# Patient Record
Sex: Female | Born: 1945 | Race: White | Hispanic: No | Marital: Married | Smoking: Never smoker
Health system: Southern US, Community
[De-identification: ages and names within clinical notes are randomized; demographics above are authoritative.]

## PROBLEM LIST (undated history)

## (undated) DIAGNOSIS — G43909 Migraine, unspecified, not intractable, without status migrainosus: Secondary | ICD-10-CM

## (undated) DIAGNOSIS — E7800 Pure hypercholesterolemia, unspecified: Secondary | ICD-10-CM

## (undated) DIAGNOSIS — M7730 Calcaneal spur, unspecified foot: Secondary | ICD-10-CM

## (undated) DIAGNOSIS — M8580 Other specified disorders of bone density and structure, unspecified site: Secondary | ICD-10-CM

## (undated) DIAGNOSIS — C801 Malignant (primary) neoplasm, unspecified: Secondary | ICD-10-CM

## (undated) DIAGNOSIS — D7582 Heparin induced thrombocytopenia (HIT): Secondary | ICD-10-CM

## (undated) DIAGNOSIS — D75829 Heparin-induced thrombocytopenia, unspecified: Secondary | ICD-10-CM

## (undated) DIAGNOSIS — R4702 Dysphasia: Secondary | ICD-10-CM

## (undated) DIAGNOSIS — C541 Malignant neoplasm of endometrium: Secondary | ICD-10-CM

## (undated) DIAGNOSIS — I10 Essential (primary) hypertension: Secondary | ICD-10-CM

## (undated) DIAGNOSIS — E039 Hypothyroidism, unspecified: Secondary | ICD-10-CM

## (undated) DIAGNOSIS — E079 Disorder of thyroid, unspecified: Secondary | ICD-10-CM

## (undated) DIAGNOSIS — I871 Compression of vein: Secondary | ICD-10-CM

## (undated) DIAGNOSIS — G2581 Restless legs syndrome: Secondary | ICD-10-CM

## (undated) HISTORY — PX: ANKLE SURGERY: SHX546

## (undated) HISTORY — PX: PORT-A-CATH REMOVAL: SHX5289

## (undated) HISTORY — DX: Restless legs syndrome: G25.81

## (undated) HISTORY — DX: Heparin-induced thrombocytopenia, unspecified: D75.829

## (undated) HISTORY — DX: Other specified disorders of bone density and structure, unspecified site: M85.80

## (undated) HISTORY — DX: Pure hypercholesterolemia, unspecified: E78.00

## (undated) HISTORY — PX: FINGER AMPUTATION: SHX636

## (undated) HISTORY — DX: Hypothyroidism, unspecified: E03.9

## (undated) HISTORY — PX: ABDOMINAL HYSTERECTOMY: SHX81

## (undated) HISTORY — DX: Heparin induced thrombocytopenia (HIT): D75.82

## (undated) HISTORY — PX: TONSILLECTOMY: SUR1361

## (undated) HISTORY — PX: 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: SHX551

## (undated) HISTORY — DX: Compression of vein: I87.1

## (undated) HISTORY — DX: Calcaneal spur, unspecified foot: M77.30

## (undated) HISTORY — PX: EYE SURGERY: SHX253

## (undated) HISTORY — PX: TEAR DUCT PROBING: SHX793

## (undated) HISTORY — DX: Dysphasia: R47.02

---

## 2006-11-09 ENCOUNTER — Ambulatory Visit: Payer: Self-pay | Admitting: Internal Medicine

## 2006-11-10 ENCOUNTER — Ambulatory Visit: Payer: Self-pay | Admitting: Internal Medicine

## 2006-11-10 ENCOUNTER — Ambulatory Visit (HOSPITAL_COMMUNITY): Admission: RE | Admit: 2006-11-10 | Discharge: 2006-11-10 | Payer: Self-pay | Admitting: Internal Medicine

## 2008-05-25 HISTORY — PX: 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: SHX551

## 2008-08-23 ENCOUNTER — Ambulatory Visit (HOSPITAL_COMMUNITY): Admission: RE | Admit: 2008-08-23 | Discharge: 2008-08-23 | Payer: Self-pay | Admitting: Hematology and Oncology

## 2008-09-18 ENCOUNTER — Ambulatory Visit: Payer: Self-pay | Admitting: Internal Medicine

## 2008-09-18 ENCOUNTER — Ambulatory Visit: Payer: Self-pay | Admitting: Oncology

## 2008-09-18 ENCOUNTER — Inpatient Hospital Stay (HOSPITAL_COMMUNITY): Admission: AD | Admit: 2008-09-18 | Discharge: 2008-11-02 | Payer: Self-pay | Admitting: Pulmonary Disease

## 2008-09-18 ENCOUNTER — Ambulatory Visit: Payer: Self-pay | Admitting: Pulmonary Disease

## 2008-09-19 ENCOUNTER — Encounter: Payer: Self-pay | Admitting: Internal Medicine

## 2008-09-21 ENCOUNTER — Encounter: Payer: Self-pay | Admitting: Vascular Surgery

## 2008-09-21 ENCOUNTER — Ambulatory Visit: Payer: Self-pay | Admitting: Infectious Diseases

## 2008-09-21 ENCOUNTER — Ambulatory Visit: Payer: Self-pay | Admitting: Vascular Surgery

## 2008-09-26 ENCOUNTER — Encounter (INDEPENDENT_AMBULATORY_CARE_PROVIDER_SITE_OTHER): Payer: Self-pay | Admitting: Plastic Surgery

## 2008-09-28 ENCOUNTER — Encounter (INDEPENDENT_AMBULATORY_CARE_PROVIDER_SITE_OTHER): Payer: Self-pay | Admitting: Internal Medicine

## 2008-10-05 ENCOUNTER — Ambulatory Visit: Payer: Self-pay | Admitting: Internal Medicine

## 2008-10-14 ENCOUNTER — Ambulatory Visit: Payer: Self-pay | Admitting: Vascular Surgery

## 2008-10-14 ENCOUNTER — Encounter: Payer: Self-pay | Admitting: Vascular Surgery

## 2008-10-15 ENCOUNTER — Ambulatory Visit: Payer: Self-pay | Admitting: Oncology

## 2008-10-15 ENCOUNTER — Encounter: Payer: Self-pay | Admitting: Vascular Surgery

## 2008-10-16 ENCOUNTER — Encounter (INDEPENDENT_AMBULATORY_CARE_PROVIDER_SITE_OTHER): Payer: Self-pay | Admitting: Internal Medicine

## 2008-10-17 ENCOUNTER — Encounter (INDEPENDENT_AMBULATORY_CARE_PROVIDER_SITE_OTHER): Payer: Self-pay | Admitting: Plastic Surgery

## 2008-10-23 ENCOUNTER — Other Ambulatory Visit: Payer: Self-pay | Admitting: Internal Medicine

## 2008-10-27 ENCOUNTER — Ambulatory Visit: Payer: Self-pay | Admitting: Hematology & Oncology

## 2008-10-29 ENCOUNTER — Ambulatory Visit: Payer: Self-pay | Admitting: Critical Care Medicine

## 2008-11-02 ENCOUNTER — Ambulatory Visit: Payer: Self-pay | Admitting: Physical Medicine & Rehabilitation

## 2008-11-02 ENCOUNTER — Inpatient Hospital Stay (HOSPITAL_COMMUNITY)
Admission: RE | Admit: 2008-11-02 | Discharge: 2008-11-10 | Payer: Self-pay | Admitting: Physical Medicine & Rehabilitation

## 2008-11-05 ENCOUNTER — Ambulatory Visit: Payer: Self-pay | Admitting: Physical Medicine & Rehabilitation

## 2010-06-15 ENCOUNTER — Encounter: Payer: Self-pay | Admitting: Interventional Radiology

## 2010-09-01 LAB — GLUCOSE, CAPILLARY
Glucose-Capillary: 106 mg/dL — ABNORMAL HIGH (ref 70–99)
Glucose-Capillary: 111 mg/dL — ABNORMAL HIGH (ref 70–99)
Glucose-Capillary: 112 mg/dL — ABNORMAL HIGH (ref 70–99)
Glucose-Capillary: 119 mg/dL — ABNORMAL HIGH (ref 70–99)
Glucose-Capillary: 120 mg/dL — ABNORMAL HIGH (ref 70–99)
Glucose-Capillary: 129 mg/dL — ABNORMAL HIGH (ref 70–99)
Glucose-Capillary: 131 mg/dL — ABNORMAL HIGH (ref 70–99)
Glucose-Capillary: 138 mg/dL — ABNORMAL HIGH (ref 70–99)
Glucose-Capillary: 88 mg/dL (ref 70–99)
Glucose-Capillary: 97 mg/dL (ref 70–99)
Glucose-Capillary: 98 mg/dL (ref 70–99)

## 2010-09-01 LAB — CBC
HCT: 26.9 % — ABNORMAL LOW (ref 36.0–46.0)
HCT: 28 % — ABNORMAL LOW (ref 36.0–46.0)
Hemoglobin: 8.6 g/dL — ABNORMAL LOW (ref 12.0–15.0)
Hemoglobin: 8.9 g/dL — ABNORMAL LOW (ref 12.0–15.0)
Hemoglobin: 9 g/dL — ABNORMAL LOW (ref 12.0–15.0)
Hemoglobin: 9 g/dL — ABNORMAL LOW (ref 12.0–15.0)
Hemoglobin: 9.2 g/dL — ABNORMAL LOW (ref 12.0–15.0)
Hemoglobin: 9.3 g/dL — ABNORMAL LOW (ref 12.0–15.0)
Hemoglobin: 9.4 g/dL — ABNORMAL LOW (ref 12.0–15.0)
MCHC: 33.5 g/dL (ref 30.0–36.0)
MCHC: 33.7 g/dL (ref 30.0–36.0)
MCHC: 33.9 g/dL (ref 30.0–36.0)
MCHC: 34.2 g/dL (ref 30.0–36.0)
MCHC: 34.2 g/dL (ref 30.0–36.0)
MCV: 95.8 fL (ref 78.0–100.0)
MCV: 94.2 fL (ref 78.0–100.0)
MCV: 94.7 fL (ref 78.0–100.0)
Platelets: 414 10*3/uL — ABNORMAL HIGH (ref 150–400)
Platelets: 219 10*3/uL (ref 150–400)
Platelets: 314 10*3/uL (ref 150–400)
Platelets: 344 10*3/uL (ref 150–400)
Platelets: 398 10*3/uL (ref 150–400)
Platelets: 424 10*3/uL — ABNORMAL HIGH (ref 150–400)
RBC: 3.03 MIL/uL — ABNORMAL LOW (ref 3.87–5.11)
RBC: 2.7 MIL/uL — ABNORMAL LOW (ref 3.87–5.11)
RBC: 2.78 MIL/uL — ABNORMAL LOW (ref 3.87–5.11)
RBC: 2.86 MIL/uL — ABNORMAL LOW (ref 3.87–5.11)
RBC: 2.88 MIL/uL — ABNORMAL LOW (ref 3.87–5.11)
RBC: 2.9 MIL/uL — ABNORMAL LOW (ref 3.87–5.11)
RBC: 2.9 MIL/uL — ABNORMAL LOW (ref 3.87–5.11)
RBC: 2.9 MIL/uL — ABNORMAL LOW (ref 3.87–5.11)
RDW: 21.5 % — ABNORMAL HIGH (ref 11.5–15.5)
RDW: 21.7 % — ABNORMAL HIGH (ref 11.5–15.5)
RDW: 21.7 % — ABNORMAL HIGH (ref 11.5–15.5)
RDW: 21.8 % — ABNORMAL HIGH (ref 11.5–15.5)
RDW: 22 % — ABNORMAL HIGH (ref 11.5–15.5)
RDW: 22.2 % — ABNORMAL HIGH (ref 11.5–15.5)
RDW: 22.2 % — ABNORMAL HIGH (ref 11.5–15.5)
RDW: 22.5 % — ABNORMAL HIGH (ref 11.5–15.5)
WBC: 8.4 10*3/uL (ref 4.0–10.5)
WBC: 10.7 10*3/uL — ABNORMAL HIGH (ref 4.0–10.5)
WBC: 6.2 10*3/uL (ref 4.0–10.5)
WBC: 6.7 10*3/uL (ref 4.0–10.5)
WBC: 7.2 10*3/uL (ref 4.0–10.5)

## 2010-09-01 LAB — COMPREHENSIVE METABOLIC PANEL
ALT: 38 U/L — ABNORMAL HIGH (ref 0–35)
AST: 27 U/L (ref 0–37)
Albumin: 2.5 g/dL — ABNORMAL LOW (ref 3.5–5.2)
Albumin: 2.5 g/dL — ABNORMAL LOW (ref 3.5–5.2)
BUN: 12 mg/dL (ref 6–23)
CO2: 20 mEq/L (ref 19–32)
Calcium: 8.4 mg/dL (ref 8.4–10.5)
Calcium: 8.6 mg/dL (ref 8.4–10.5)
Chloride: 108 mEq/L (ref 96–112)
Creatinine, Ser: 0.62 mg/dL (ref 0.4–1.2)
GFR calc Af Amer: >60 mL/min (ref >60)
GFR calc non Af Amer: >60 mL/min (ref >60)
Sodium: 135 mEq/L (ref 135–145)
Total Bilirubin: 0.3 mg/dL (ref 0.3–1.2)
Total Protein: 5.6 g/dL — ABNORMAL LOW (ref 6.0–8.3)

## 2010-09-01 LAB — BASIC METABOLIC PANEL
BUN: 15 mg/dL (ref 6–23)
BUN: 12 mg/dL (ref 6–23)
BUN: 21 mg/dL (ref 6–23)
BUN: 21 mg/dL (ref 6–23)
CO2: 24 mEq/L (ref 19–32)
CO2: 29 mEq/L (ref 19–32)
CO2: 33 mEq/L — ABNORMAL HIGH (ref 19–32)
Calcium: 9.2 mg/dL (ref 8.4–10.5)
Calcium: 8.6 mg/dL (ref 8.4–10.5)
Calcium: 8.7 mg/dL (ref 8.4–10.5)
Calcium: 8.7 mg/dL (ref 8.4–10.5)
Calcium: 8.7 mg/dL (ref 8.4–10.5)
Calcium: 8.9 mg/dL (ref 8.4–10.5)
Chloride: 107 mEq/L (ref 96–112)
Chloride: 112 mEq/L (ref 96–112)
Creatinine, Ser: 0.69 mg/dL (ref 0.4–1.2)
Creatinine, Ser: 0.69 mg/dL (ref 0.4–1.2)
Creatinine, Ser: 0.7 mg/dL (ref 0.4–1.2)
Creatinine, Ser: 0.74 mg/dL (ref 0.4–1.2)
GFR calc Af Amer: >60 mL/min (ref >60)
GFR calc Af Amer: >60 mL/min (ref >60)
GFR calc Af Amer: >60 mL/min (ref >60)
GFR calc Af Amer: >60 mL/min (ref >60)
GFR calc Af Amer: >60 mL/min (ref >60)
GFR calc Af Amer: >60 mL/min (ref >60)
GFR calc non Af Amer: >60 mL/min (ref >60)
GFR calc non Af Amer: >60 mL/min (ref >60)
GFR calc non Af Amer: >60 mL/min (ref >60)
GFR calc non Af Amer: >60 mL/min (ref >60)
GFR calc non Af Amer: >60 mL/min (ref >60)
GFR calc non Af Amer: >60 mL/min (ref >60)
Glucose, Bld: 100 mg/dL — ABNORMAL HIGH (ref 70–99)
Glucose, Bld: 105 mg/dL — ABNORMAL HIGH (ref 70–99)
Glucose, Bld: 129 mg/dL — ABNORMAL HIGH (ref 70–99)
Potassium: 3.4 mEq/L — ABNORMAL LOW (ref 3.5–5.1)
Sodium: 137 mEq/L (ref 135–145)
Sodium: 139 mEq/L (ref 135–145)
Sodium: 140 mEq/L (ref 135–145)
Sodium: 142 mEq/L (ref 135–145)

## 2010-09-01 LAB — PROTIME-INR
INR: 2.7 — ABNORMAL HIGH (ref 0.00–1.49)
INR: 2.9 — ABNORMAL HIGH (ref 0.00–1.49)
INR: 3.8 — ABNORMAL HIGH (ref 0.00–1.49)
INR: 1.5 (ref 0.00–1.49)
INR: 1.5 (ref 0.00–1.49)
INR: 1.9 — ABNORMAL HIGH (ref 0.00–1.49)
INR: 2.2 — ABNORMAL HIGH (ref 0.00–1.49)
INR: 3.1 — ABNORMAL HIGH (ref 0.00–1.49)
INR: 3.1 — ABNORMAL HIGH (ref 0.00–1.49)
INR: 3.2 — ABNORMAL HIGH (ref 0.00–1.49)
INR: 3.4 — ABNORMAL HIGH (ref 0.00–1.49)
INR: 3.6 — ABNORMAL HIGH (ref 0.00–1.49)
INR: 4.2 — ABNORMAL HIGH (ref 0.00–1.49)
INR: 4.5 — ABNORMAL HIGH (ref 0.00–1.49)
INR: 4.6 — ABNORMAL HIGH (ref 0.00–1.49)
Prothrombin Time: 31 seconds — ABNORMAL HIGH (ref 11.6–15.2)
Prothrombin Time: 18.9 seconds — ABNORMAL HIGH (ref 11.6–15.2)
Prothrombin Time: 19 seconds — ABNORMAL HIGH (ref 11.6–15.2)
Prothrombin Time: 26.2 seconds — ABNORMAL HIGH (ref 11.6–15.2)
Prothrombin Time: 33.9 seconds — ABNORMAL HIGH (ref 11.6–15.2)
Prothrombin Time: 34.2 seconds — ABNORMAL HIGH (ref 11.6–15.2)
Prothrombin Time: 34.9 seconds — ABNORMAL HIGH (ref 11.6–15.2)
Prothrombin Time: 37.2 seconds — ABNORMAL HIGH (ref 11.6–15.2)
Prothrombin Time: 48 seconds — ABNORMAL HIGH (ref 11.6–15.2)

## 2010-09-01 LAB — APTT
aPTT: 75 seconds — ABNORMAL HIGH (ref 24–37)
aPTT: 81 seconds — ABNORMAL HIGH (ref 24–37)
aPTT: 84 seconds — ABNORMAL HIGH (ref 24–37)
aPTT: 92 seconds — ABNORMAL HIGH (ref 24–37)
aPTT: 105 seconds — ABNORMAL HIGH (ref 24–37)
aPTT: 106 seconds — ABNORMAL HIGH (ref 24–37)
aPTT: 107 seconds — ABNORMAL HIGH (ref 24–37)
aPTT: 118 seconds — ABNORMAL HIGH (ref 24–37)
aPTT: 118 seconds — ABNORMAL HIGH (ref 24–37)
aPTT: 121 seconds — ABNORMAL HIGH (ref 24–37)
aPTT: 131 seconds — ABNORMAL HIGH (ref 24–37)
aPTT: 143 seconds — ABNORMAL HIGH (ref 24–37)
aPTT: 150 seconds — ABNORMAL HIGH (ref 24–37)
aPTT: 162 seconds — ABNORMAL HIGH (ref 24–37)
aPTT: 171 seconds — ABNORMAL HIGH (ref 24–37)
aPTT: 61 seconds — ABNORMAL HIGH (ref 24–37)
aPTT: 84 seconds — ABNORMAL HIGH (ref 24–37)
aPTT: 91 seconds — ABNORMAL HIGH (ref 24–37)

## 2010-09-01 LAB — DIFFERENTIAL
Eosinophils Relative: 5 % (ref 0–5)
Lymphs Abs: 1 10*3/uL (ref 0.7–4.0)
Monocytes Absolute: 0.4 10*3/uL (ref 0.1–1.0)
Neutro Abs: 4.4 10*3/uL (ref 1.7–7.7)

## 2010-09-01 LAB — URINALYSIS, ROUTINE W REFLEX MICROSCOPIC
Glucose, UA: NEGATIVE mg/dL
Ketones, ur: NEGATIVE mg/dL
Nitrite: NEGATIVE
pH: 5 (ref 5.0–8.0)

## 2010-09-01 LAB — URINALYSIS, MICROSCOPIC ONLY
Bilirubin Urine: NEGATIVE
Glucose, UA: NEGATIVE mg/dL
Ketones, ur: NEGATIVE mg/dL
pH: 6 (ref 5.0–8.0)

## 2010-09-01 LAB — CHROMOGENIC FACTOR X (DUKE LAB)
CHROM XA: 102 % (ref 86–146)
CHROM XA: 29.4 % — ABNORMAL LOW (ref 86–146)
CHROM XA: 31.2 % — ABNORMAL LOW (ref 86–146)

## 2010-09-01 LAB — APTT MIXING STUDY SCREEN: aPTT Baseline: 133 seconds — ABNORMAL HIGH (ref 24–37)

## 2010-09-01 LAB — URINE MICROSCOPIC-ADD ON

## 2010-09-01 LAB — URINE CULTURE

## 2010-09-01 LAB — PTT FACTOR INHIBITOR (MIXING STUDY): 1 Hr Incub APTT 4:1NP: 75 seconds

## 2010-09-02 LAB — CBC
HCT: 23.6 % — ABNORMAL LOW (ref 36.0–46.0)
HCT: 24 % — ABNORMAL LOW (ref 36.0–46.0)
HCT: 24.5 % — ABNORMAL LOW (ref 36.0–46.0)
HCT: 25.3 % — ABNORMAL LOW (ref 36.0–46.0)
HCT: 26 % — ABNORMAL LOW (ref 36.0–46.0)
HCT: 26.9 % — ABNORMAL LOW (ref 36.0–46.0)
HCT: 28.2 % — ABNORMAL LOW (ref 36.0–46.0)
HCT: 29.6 % — ABNORMAL LOW (ref 36.0–46.0)
HCT: 30.1 % — ABNORMAL LOW (ref 36.0–46.0)
HCT: 20.5 % — ABNORMAL LOW (ref 36.0–46.0)
HCT: 21.4 % — ABNORMAL LOW (ref 36.0–46.0)
HCT: 21.7 % — ABNORMAL LOW (ref 36.0–46.0)
HCT: 22 % — ABNORMAL LOW (ref 36.0–46.0)
HCT: 22.5 % — ABNORMAL LOW (ref 36.0–46.0)
HCT: 22.9 % — ABNORMAL LOW (ref 36.0–46.0)
HCT: 23.3 % — ABNORMAL LOW (ref 36.0–46.0)
HCT: 23.8 % — ABNORMAL LOW (ref 36.0–46.0)
HCT: 24.1 % — ABNORMAL LOW (ref 36.0–46.0)
HCT: 24.3 % — ABNORMAL LOW (ref 36.0–46.0)
HCT: 26.3 % — ABNORMAL LOW (ref 36.0–46.0)
HCT: 27.3 % — ABNORMAL LOW (ref 36.0–46.0)
Hemoglobin: 10.4 g/dL — ABNORMAL LOW (ref 12.0–15.0)
Hemoglobin: 10.4 g/dL — ABNORMAL LOW (ref 12.0–15.0)
Hemoglobin: 8.1 g/dL — ABNORMAL LOW (ref 12.0–15.0)
Hemoglobin: 9.2 g/dL — ABNORMAL LOW (ref 12.0–15.0)
Hemoglobin: 9.6 g/dL — ABNORMAL LOW (ref 12.0–15.0)
Hemoglobin: 6.1 g/dL — CL (ref 12.0–15.0)
Hemoglobin: 7.2 g/dL — CL (ref 12.0–15.0)
Hemoglobin: 7.2 g/dL — CL (ref 12.0–15.0)
Hemoglobin: 7.5 g/dL — CL (ref 12.0–15.0)
Hemoglobin: 7.6 g/dL — CL (ref 12.0–15.0)
Hemoglobin: 8.3 g/dL — ABNORMAL LOW (ref 12.0–15.0)
Hemoglobin: 8.3 g/dL — ABNORMAL LOW (ref 12.0–15.0)
Hemoglobin: 8.5 g/dL — ABNORMAL LOW (ref 12.0–15.0)
Hemoglobin: 9 g/dL — ABNORMAL LOW (ref 12.0–15.0)
Hemoglobin: 9.4 g/dL — ABNORMAL LOW (ref 12.0–15.0)
Hemoglobin: 9.4 g/dL — ABNORMAL LOW (ref 12.0–15.0)
Hemoglobin: 9.5 g/dL — ABNORMAL LOW (ref 12.0–15.0)
Hemoglobin: 9.6 g/dL — ABNORMAL LOW (ref 12.0–15.0)
MCHC: 33.7 g/dL (ref 30.0–36.0)
MCHC: 34 g/dL (ref 30.0–36.0)
MCHC: 34 g/dL (ref 30.0–36.0)
MCHC: 34.1 g/dL (ref 30.0–36.0)
MCHC: 34.5 g/dL (ref 30.0–36.0)
MCHC: 34.5 g/dL (ref 30.0–36.0)
MCHC: 33.7 g/dL (ref 30.0–36.0)
MCHC: 34.3 g/dL (ref 30.0–36.0)
MCHC: 34.5 g/dL (ref 30.0–36.0)
MCHC: 34.5 g/dL (ref 30.0–36.0)
MCHC: 34.7 g/dL (ref 30.0–36.0)
MCHC: 34.8 g/dL (ref 30.0–36.0)
MCHC: 34.9 g/dL (ref 30.0–36.0)
MCHC: 35.2 g/dL (ref 30.0–36.0)
MCHC: 35.4 g/dL (ref 30.0–36.0)
MCHC: 35.8 g/dL (ref 30.0–36.0)
MCV: 89.1 fL (ref 78.0–100.0)
MCV: 89.7 fL (ref 78.0–100.0)
MCV: 90.2 fL (ref 78.0–100.0)
MCV: 90.9 fL (ref 78.0–100.0)
MCV: 92.8 fL (ref 78.0–100.0)
MCV: 93.7 fL (ref 78.0–100.0)
MCV: 93.8 fL (ref 78.0–100.0)
MCV: 90.1 fL (ref 78.0–100.0)
MCV: 90.2 fL (ref 78.0–100.0)
MCV: 91 fL (ref 78.0–100.0)
MCV: 91.6 fL (ref 78.0–100.0)
MCV: 91.7 fL (ref 78.0–100.0)
MCV: 92.2 fL (ref 78.0–100.0)
MCV: 93 fL (ref 78.0–100.0)
Platelets: 118 10*3/uL — ABNORMAL LOW (ref 150–400)
Platelets: 162 10*3/uL (ref 150–400)
Platelets: 194 10*3/uL (ref 150–400)
Platelets: 205 10*3/uL (ref 150–400)
Platelets: 211 10*3/uL (ref 150–400)
Platelets: 214 10*3/uL (ref 150–400)
Platelets: 214 10*3/uL (ref 150–400)
Platelets: 235 10*3/uL (ref 150–400)
Platelets: 240 10*3/uL (ref 150–400)
Platelets: 281 10*3/uL (ref 150–400)
Platelets: 84 10*3/uL — ABNORMAL LOW (ref 150–400)
Platelets: 14 10*3/uL — CL (ref 150–400)
Platelets: 16 10*3/uL — CL (ref 150–400)
Platelets: 23 10*3/uL — CL (ref 150–400)
Platelets: 28 10*3/uL — CL (ref 150–400)
Platelets: 37 10*3/uL — CL (ref 150–400)
Platelets: 47 10*3/uL — CL (ref 150–400)
Platelets: 71 10*3/uL — ABNORMAL LOW (ref 150–400)
Platelets: 74 10*3/uL — ABNORMAL LOW (ref 150–400)
Platelets: 88 10*3/uL — ABNORMAL LOW (ref 150–400)
Platelets: 92 10*3/uL — ABNORMAL LOW (ref 150–400)
RBC: 2.55 MIL/uL — ABNORMAL LOW (ref 3.87–5.11)
RBC: 2.95 MIL/uL — ABNORMAL LOW (ref 3.87–5.11)
RBC: 3.2 MIL/uL — ABNORMAL LOW (ref 3.87–5.11)
RBC: 3.3 MIL/uL — ABNORMAL LOW (ref 3.87–5.11)
RBC: 3.41 MIL/uL — ABNORMAL LOW (ref 3.87–5.11)
RBC: 2.3 MIL/uL — ABNORMAL LOW (ref 3.87–5.11)
RBC: 2.36 MIL/uL — ABNORMAL LOW (ref 3.87–5.11)
RBC: 2.38 MIL/uL — ABNORMAL LOW (ref 3.87–5.11)
RBC: 2.38 MIL/uL — ABNORMAL LOW (ref 3.87–5.11)
RBC: 2.54 MIL/uL — ABNORMAL LOW (ref 3.87–5.11)
RBC: 2.63 MIL/uL — ABNORMAL LOW (ref 3.87–5.11)
RBC: 2.88 MIL/uL — ABNORMAL LOW (ref 3.87–5.11)
RBC: 2.88 MIL/uL — ABNORMAL LOW (ref 3.87–5.11)
RBC: 3.03 MIL/uL — ABNORMAL LOW (ref 3.87–5.11)
RBC: 3.03 MIL/uL — ABNORMAL LOW (ref 3.87–5.11)
RBC: 3.08 MIL/uL — ABNORMAL LOW (ref 3.87–5.11)
RDW: 18.2 % — ABNORMAL HIGH (ref 11.5–15.5)
RDW: 18.4 % — ABNORMAL HIGH (ref 11.5–15.5)
RDW: 18.8 % — ABNORMAL HIGH (ref 11.5–15.5)
RDW: 18.8 % — ABNORMAL HIGH (ref 11.5–15.5)
RDW: 18.9 % — ABNORMAL HIGH (ref 11.5–15.5)
RDW: 19 % — ABNORMAL HIGH (ref 11.5–15.5)
RDW: 19.3 % — ABNORMAL HIGH (ref 11.5–15.5)
RDW: 19.9 % — ABNORMAL HIGH (ref 11.5–15.5)
RDW: 20.5 % — ABNORMAL HIGH (ref 11.5–15.5)
RDW: 21 % — ABNORMAL HIGH (ref 11.5–15.5)
RDW: 21.9 % — ABNORMAL HIGH (ref 11.5–15.5)
RDW: 15.7 % — ABNORMAL HIGH (ref 11.5–15.5)
RDW: 15.7 % — ABNORMAL HIGH (ref 11.5–15.5)
RDW: 17.4 % — ABNORMAL HIGH (ref 11.5–15.5)
RDW: 17.8 % — ABNORMAL HIGH (ref 11.5–15.5)
RDW: 17.9 % — ABNORMAL HIGH (ref 11.5–15.5)
RDW: 18.4 % — ABNORMAL HIGH (ref 11.5–15.5)
RDW: 18.5 % — ABNORMAL HIGH (ref 11.5–15.5)
RDW: 18.6 % — ABNORMAL HIGH (ref 11.5–15.5)
RDW: 19 % — ABNORMAL HIGH (ref 11.5–15.5)
RDW: 19.4 % — ABNORMAL HIGH (ref 11.5–15.5)
RDW: 19.6 % — ABNORMAL HIGH (ref 11.5–15.5)
WBC: 10 10*3/uL (ref 4.0–10.5)
WBC: 10.1 10*3/uL (ref 4.0–10.5)
WBC: 10.4 10*3/uL (ref 4.0–10.5)
WBC: 13.4 10*3/uL — ABNORMAL HIGH (ref 4.0–10.5)
WBC: 16.6 10*3/uL — ABNORMAL HIGH (ref 4.0–10.5)
WBC: 8.4 10*3/uL (ref 4.0–10.5)
WBC: 8.9 10*3/uL (ref 4.0–10.5)
WBC: 9 10*3/uL (ref 4.0–10.5)
WBC: 9.3 10*3/uL (ref 4.0–10.5)
WBC: 9.3 10*3/uL (ref 4.0–10.5)
WBC: 13.6 10*3/uL — ABNORMAL HIGH (ref 4.0–10.5)
WBC: 15.2 10*3/uL — ABNORMAL HIGH (ref 4.0–10.5)
WBC: 15.3 10*3/uL — ABNORMAL HIGH (ref 4.0–10.5)
WBC: 15.9 10*3/uL — ABNORMAL HIGH (ref 4.0–10.5)
WBC: 16.2 10*3/uL — ABNORMAL HIGH (ref 4.0–10.5)
WBC: 16.8 10*3/uL — ABNORMAL HIGH (ref 4.0–10.5)
WBC: 17.3 10*3/uL — ABNORMAL HIGH (ref 4.0–10.5)
WBC: 18.3 10*3/uL — ABNORMAL HIGH (ref 4.0–10.5)
WBC: 20 10*3/uL — ABNORMAL HIGH (ref 4.0–10.5)
WBC: 22.7 10*3/uL — ABNORMAL HIGH (ref 4.0–10.5)
WBC: 23.9 10*3/uL — ABNORMAL HIGH (ref 4.0–10.5)
WBC: 6.1 10*3/uL (ref 4.0–10.5)
WBC: 7.9 10*3/uL (ref 4.0–10.5)

## 2010-09-02 LAB — GLUCOSE, CAPILLARY
Glucose-Capillary: 105 mg/dL — ABNORMAL HIGH (ref 70–99)
Glucose-Capillary: 113 mg/dL — ABNORMAL HIGH (ref 70–99)
Glucose-Capillary: 118 mg/dL — ABNORMAL HIGH (ref 70–99)
Glucose-Capillary: 128 mg/dL — ABNORMAL HIGH (ref 70–99)
Glucose-Capillary: 100 mg/dL — ABNORMAL HIGH (ref 70–99)
Glucose-Capillary: 102 mg/dL — ABNORMAL HIGH (ref 70–99)
Glucose-Capillary: 104 mg/dL — ABNORMAL HIGH (ref 70–99)
Glucose-Capillary: 105 mg/dL — ABNORMAL HIGH (ref 70–99)
Glucose-Capillary: 106 mg/dL — ABNORMAL HIGH (ref 70–99)
Glucose-Capillary: 107 mg/dL — ABNORMAL HIGH (ref 70–99)
Glucose-Capillary: 109 mg/dL — ABNORMAL HIGH (ref 70–99)
Glucose-Capillary: 112 mg/dL — ABNORMAL HIGH (ref 70–99)
Glucose-Capillary: 112 mg/dL — ABNORMAL HIGH (ref 70–99)
Glucose-Capillary: 113 mg/dL — ABNORMAL HIGH (ref 70–99)
Glucose-Capillary: 114 mg/dL — ABNORMAL HIGH (ref 70–99)
Glucose-Capillary: 115 mg/dL — ABNORMAL HIGH (ref 70–99)
Glucose-Capillary: 115 mg/dL — ABNORMAL HIGH (ref 70–99)
Glucose-Capillary: 116 mg/dL — ABNORMAL HIGH (ref 70–99)
Glucose-Capillary: 116 mg/dL — ABNORMAL HIGH (ref 70–99)
Glucose-Capillary: 117 mg/dL — ABNORMAL HIGH (ref 70–99)
Glucose-Capillary: 118 mg/dL — ABNORMAL HIGH (ref 70–99)
Glucose-Capillary: 118 mg/dL — ABNORMAL HIGH (ref 70–99)
Glucose-Capillary: 119 mg/dL — ABNORMAL HIGH (ref 70–99)
Glucose-Capillary: 119 mg/dL — ABNORMAL HIGH (ref 70–99)
Glucose-Capillary: 122 mg/dL — ABNORMAL HIGH (ref 70–99)
Glucose-Capillary: 122 mg/dL — ABNORMAL HIGH (ref 70–99)
Glucose-Capillary: 124 mg/dL — ABNORMAL HIGH (ref 70–99)
Glucose-Capillary: 124 mg/dL — ABNORMAL HIGH (ref 70–99)
Glucose-Capillary: 125 mg/dL — ABNORMAL HIGH (ref 70–99)
Glucose-Capillary: 125 mg/dL — ABNORMAL HIGH (ref 70–99)
Glucose-Capillary: 126 mg/dL — ABNORMAL HIGH (ref 70–99)
Glucose-Capillary: 126 mg/dL — ABNORMAL HIGH (ref 70–99)
Glucose-Capillary: 126 mg/dL — ABNORMAL HIGH (ref 70–99)
Glucose-Capillary: 127 mg/dL — ABNORMAL HIGH (ref 70–99)
Glucose-Capillary: 127 mg/dL — ABNORMAL HIGH (ref 70–99)
Glucose-Capillary: 129 mg/dL — ABNORMAL HIGH (ref 70–99)
Glucose-Capillary: 130 mg/dL — ABNORMAL HIGH (ref 70–99)
Glucose-Capillary: 131 mg/dL — ABNORMAL HIGH (ref 70–99)
Glucose-Capillary: 131 mg/dL — ABNORMAL HIGH (ref 70–99)
Glucose-Capillary: 132 mg/dL — ABNORMAL HIGH (ref 70–99)
Glucose-Capillary: 141 mg/dL — ABNORMAL HIGH (ref 70–99)
Glucose-Capillary: 16 mg/dL — CL (ref 70–99)
Glucose-Capillary: 56 mg/dL — ABNORMAL LOW (ref 70–99)
Glucose-Capillary: 78 mg/dL (ref 70–99)
Glucose-Capillary: 88 mg/dL (ref 70–99)
Glucose-Capillary: 91 mg/dL (ref 70–99)
Glucose-Capillary: 92 mg/dL (ref 70–99)

## 2010-09-02 LAB — COMPREHENSIVE METABOLIC PANEL
ALT: 104 U/L — ABNORMAL HIGH (ref 0–35)
ALT: 26 U/L (ref 0–35)
AST: 21 U/L (ref 0–37)
AST: 69 U/L — ABNORMAL HIGH (ref 0–37)
AST: 19 U/L (ref 0–37)
AST: 22 U/L (ref 0–37)
AST: 23 U/L (ref 0–37)
AST: 39 U/L — ABNORMAL HIGH (ref 0–37)
AST: 81 U/L — ABNORMAL HIGH (ref 0–37)
Albumin: 2.2 g/dL — ABNORMAL LOW (ref 3.5–5.2)
Albumin: 2.1 g/dL — ABNORMAL LOW (ref 3.5–5.2)
Albumin: 2.1 g/dL — ABNORMAL LOW (ref 3.5–5.2)
Albumin: 2.2 g/dL — ABNORMAL LOW (ref 3.5–5.2)
Albumin: 2.3 g/dL — ABNORMAL LOW (ref 3.5–5.2)
Albumin: 2.5 g/dL — ABNORMAL LOW (ref 3.5–5.2)
Alkaline Phosphatase: 153 U/L — ABNORMAL HIGH (ref 39–117)
Alkaline Phosphatase: 319 U/L — ABNORMAL HIGH (ref 39–117)
Alkaline Phosphatase: 125 U/L — ABNORMAL HIGH (ref 39–117)
Alkaline Phosphatase: 212 U/L — ABNORMAL HIGH (ref 39–117)
Alkaline Phosphatase: 345 U/L — ABNORMAL HIGH (ref 39–117)
Alkaline Phosphatase: 474 U/L — ABNORMAL HIGH (ref 39–117)
BUN: 31 mg/dL — ABNORMAL HIGH (ref 6–23)
BUN: 37 mg/dL — ABNORMAL HIGH (ref 6–23)
BUN: 61 mg/dL — ABNORMAL HIGH (ref 6–23)
BUN: 66 mg/dL — ABNORMAL HIGH (ref 6–23)
BUN: 67 mg/dL — ABNORMAL HIGH (ref 6–23)
CO2: 36 mEq/L — ABNORMAL HIGH (ref 19–32)
CO2: 16 mEq/L — ABNORMAL LOW (ref 19–32)
CO2: 21 mEq/L (ref 19–32)
CO2: 33 mEq/L — ABNORMAL HIGH (ref 19–32)
CO2: 37 mEq/L — ABNORMAL HIGH (ref 19–32)
Calcium: 7.4 mg/dL — ABNORMAL LOW (ref 8.4–10.5)
Calcium: 7.9 mg/dL — ABNORMAL LOW (ref 8.4–10.5)
Calcium: 8.5 mg/dL (ref 8.4–10.5)
Calcium: 8.7 mg/dL (ref 8.4–10.5)
Chloride: 101 mEq/L (ref 96–112)
Chloride: 103 mEq/L (ref 96–112)
Chloride: 112 mEq/L (ref 96–112)
Chloride: 98 mEq/L (ref 96–112)
Chloride: 99 mEq/L (ref 96–112)
Creatinine, Ser: 0.7 mg/dL (ref 0.4–1.2)
Creatinine, Ser: 0.85 mg/dL (ref 0.4–1.2)
Creatinine, Ser: 1 mg/dL (ref 0.4–1.2)
Creatinine, Ser: 1.22 mg/dL — ABNORMAL HIGH (ref 0.4–1.2)
Creatinine, Ser: 1.23 mg/dL — ABNORMAL HIGH (ref 0.4–1.2)
Creatinine, Ser: 1.27 mg/dL — ABNORMAL HIGH (ref 0.4–1.2)
Creatinine, Ser: 1.3 mg/dL — ABNORMAL HIGH (ref 0.4–1.2)
Creatinine, Ser: 1.42 mg/dL — ABNORMAL HIGH (ref 0.4–1.2)
GFR calc Af Amer: >60 mL/min (ref >60)
GFR calc Af Amer: >60 mL/min (ref >60)
GFR calc Af Amer: 45 mL/min — ABNORMAL LOW (ref >60)
GFR calc Af Amer: 49 mL/min — ABNORMAL LOW (ref >60)
GFR calc Af Amer: 50 mL/min — ABNORMAL LOW (ref >60)
GFR calc Af Amer: 54 mL/min — ABNORMAL LOW (ref >60)
GFR calc non Af Amer: >60 mL/min (ref >60)
GFR calc non Af Amer: 37 mL/min — ABNORMAL LOW (ref >60)
GFR calc non Af Amer: 42 mL/min — ABNORMAL LOW (ref >60)
GFR calc non Af Amer: 43 mL/min — ABNORMAL LOW (ref >60)
GFR calc non Af Amer: 44 mL/min — ABNORMAL LOW (ref >60)
GFR calc non Af Amer: 45 mL/min — ABNORMAL LOW (ref >60)
GFR calc non Af Amer: 56 mL/min — ABNORMAL LOW (ref >60)
Glucose, Bld: 149 mg/dL — ABNORMAL HIGH (ref 70–99)
Glucose, Bld: 150 mg/dL — ABNORMAL HIGH (ref 70–99)
Glucose, Bld: 152 mg/dL — ABNORMAL HIGH (ref 70–99)
Potassium: 3.3 mEq/L — ABNORMAL LOW (ref 3.5–5.1)
Potassium: 3.6 mEq/L (ref 3.5–5.1)
Potassium: 3.8 mEq/L (ref 3.5–5.1)
Potassium: 3.8 mEq/L (ref 3.5–5.1)
Potassium: 3.9 mEq/L (ref 3.5–5.1)
Potassium: 4.5 mEq/L (ref 3.5–5.1)
Sodium: 139 mEq/L (ref 135–145)
Sodium: 143 mEq/L (ref 135–145)
Total Bilirubin: 0.5 mg/dL (ref 0.3–1.2)
Total Bilirubin: 0.9 mg/dL (ref 0.3–1.2)
Total Bilirubin: 1.3 mg/dL — ABNORMAL HIGH (ref 0.3–1.2)
Total Bilirubin: 1.4 mg/dL — ABNORMAL HIGH (ref 0.3–1.2)
Total Bilirubin: 2.2 mg/dL — ABNORMAL HIGH (ref 0.3–1.2)
Total Bilirubin: 2.7 mg/dL — ABNORMAL HIGH (ref 0.3–1.2)
Total Protein: 5.6 g/dL — ABNORMAL LOW (ref 6.0–8.3)
Total Protein: 5 g/dL — ABNORMAL LOW (ref 6.0–8.3)
Total Protein: 5.1 g/dL — ABNORMAL LOW (ref 6.0–8.3)

## 2010-09-02 LAB — POCT I-STAT 3, ART BLOOD GAS (G3+)
Acid-Base Excess: 4 mmol/L — ABNORMAL HIGH (ref 0.0–2.0)
Acid-Base Excess: 6 mmol/L — ABNORMAL HIGH (ref 0.0–2.0)
Acid-Base Excess: 13 mmol/L — ABNORMAL HIGH (ref 0.0–2.0)
Acid-base deficit: 1 mmol/L (ref 0.0–2.0)
Acid-base deficit: 16 mmol/L — ABNORMAL HIGH (ref 0.0–2.0)
Acid-base deficit: 3 mmol/L — ABNORMAL HIGH (ref 0.0–2.0)
Acid-base deficit: 7 mmol/L — ABNORMAL HIGH (ref 0.0–2.0)
Acid-base deficit: 8 mmol/L — ABNORMAL HIGH (ref 0.0–2.0)
Acid-base deficit: 9 mmol/L — ABNORMAL HIGH (ref 0.0–2.0)
Acid-base deficit: 9 mmol/L — ABNORMAL HIGH (ref 0.0–2.0)
Bicarbonate: 25.2 mEq/L — ABNORMAL HIGH (ref 20.0–24.0)
Bicarbonate: 13.2 mEq/L — ABNORMAL LOW (ref 20.0–24.0)
Bicarbonate: 15 mEq/L — ABNORMAL LOW (ref 20.0–24.0)
Bicarbonate: 15.1 mEq/L — ABNORMAL LOW (ref 20.0–24.0)
Bicarbonate: 15.7 mEq/L — ABNORMAL LOW (ref 20.0–24.0)
Bicarbonate: 16 mEq/L — ABNORMAL LOW (ref 20.0–24.0)
Bicarbonate: 19.7 mEq/L — ABNORMAL LOW (ref 20.0–24.0)
Bicarbonate: 29 mEq/L — ABNORMAL HIGH (ref 20.0–24.0)
Bicarbonate: 29.1 mEq/L — ABNORMAL HIGH (ref 20.0–24.0)
Bicarbonate: 34.5 mEq/L — ABNORMAL HIGH (ref 20.0–24.0)
Bicarbonate: 34.7 mEq/L — ABNORMAL HIGH (ref 20.0–24.0)
Bicarbonate: 36.7 mEq/L — ABNORMAL HIGH (ref 20.0–24.0)
Bicarbonate: 9.4 mEq/L — ABNORMAL LOW (ref 20.0–24.0)
O2 Saturation: 84 %
O2 Saturation: 93 %
O2 Saturation: 96 %
O2 Saturation: 97 %
O2 Saturation: 98 %
O2 Saturation: 98 %
O2 Saturation: 99 %
O2 Saturation: 99 %
Patient temperature: 98.5
Patient temperature: 98.5
Patient temperature: 98.6
Patient temperature: 99.8
Patient temperature: 99.9
TCO2: 26 mmol/L (ref 0–100)
TCO2: 28 mmol/L (ref 0–100)
TCO2: 29 mmol/L (ref 0–100)
TCO2: 16 mmol/L (ref 0–100)
TCO2: 17 mmol/L (ref 0–100)
TCO2: 18 mmol/L (ref 0–100)
TCO2: 36 mmol/L (ref 0–100)
TCO2: 38 mmol/L (ref 0–100)
pCO2 arterial: 29.9 mmHg — ABNORMAL LOW (ref 35.0–45.0)
pCO2 arterial: 30.7 mmHg — ABNORMAL LOW (ref 35.0–45.0)
pCO2 arterial: 40.6 mmHg (ref 35.0–45.0)
pCO2 arterial: 18.8 mmHg — CL (ref 35.0–45.0)
pCO2 arterial: 24.6 mmHg — ABNORMAL LOW (ref 35.0–45.0)
pCO2 arterial: 24.9 mmHg — ABNORMAL LOW (ref 35.0–45.0)
pCO2 arterial: 25.7 mmHg — ABNORMAL LOW (ref 35.0–45.0)
pCO2 arterial: 25.7 mmHg — ABNORMAL LOW (ref 35.0–45.0)
pCO2 arterial: 26.8 mmHg — ABNORMAL LOW (ref 35.0–45.0)
pCO2 arterial: 34 mmHg — ABNORMAL LOW (ref 35.0–45.0)
pCO2 arterial: 37.7 mmHg (ref 35.0–45.0)
pCO2 arterial: 40.1 mmHg (ref 35.0–45.0)
pH, Arterial: 7.44 — ABNORMAL HIGH (ref 7.350–7.400)
pH, Arterial: 7.534 — ABNORMAL HIGH (ref 7.350–7.400)
pH, Arterial: 7.306 — ABNORMAL LOW (ref 7.350–7.400)
pH, Arterial: 7.324 — ABNORMAL LOW (ref 7.350–7.400)
pH, Arterial: 7.454 — ABNORMAL HIGH (ref 7.350–7.400)
pH, Arterial: 7.492 — ABNORMAL HIGH (ref 7.350–7.400)
pH, Arterial: 7.517 — ABNORMAL HIGH (ref 7.350–7.400)
pH, Arterial: 7.533 — ABNORMAL HIGH (ref 7.350–7.400)
pH, Arterial: 7.667 (ref 7.350–7.400)
pO2, Arterial: 104 mmHg — ABNORMAL HIGH (ref 80.0–100.0)
pO2, Arterial: 109 mmHg — ABNORMAL HIGH (ref 80.0–100.0)
pO2, Arterial: 120 mmHg — ABNORMAL HIGH (ref 80.0–100.0)
pO2, Arterial: 121 mmHg — ABNORMAL HIGH (ref 80.0–100.0)
pO2, Arterial: 171 mmHg — ABNORMAL HIGH (ref 80.0–100.0)
pO2, Arterial: 369 mmHg — ABNORMAL HIGH (ref 80.0–100.0)
pO2, Arterial: 70 mmHg — ABNORMAL LOW (ref 80.0–100.0)
pO2, Arterial: 88 mmHg (ref 80.0–100.0)
pO2, Arterial: 88 mmHg (ref 80.0–100.0)
pO2, Arterial: 95 mmHg (ref 80.0–100.0)
pO2, Arterial: 99 mmHg (ref 80.0–100.0)

## 2010-09-02 LAB — APTT
aPTT: 107 seconds — ABNORMAL HIGH (ref 24–37)
aPTT: 110 seconds — ABNORMAL HIGH (ref 24–37)
aPTT: 110 seconds — ABNORMAL HIGH (ref 24–37)
aPTT: 111 seconds — ABNORMAL HIGH (ref 24–37)
aPTT: 118 seconds — ABNORMAL HIGH (ref 24–37)
aPTT: 42 seconds — ABNORMAL HIGH (ref 24–37)
aPTT: 58 seconds — ABNORMAL HIGH (ref 24–37)
aPTT: 60 seconds — ABNORMAL HIGH (ref 24–37)
aPTT: 73 seconds — ABNORMAL HIGH (ref 24–37)
aPTT: 74 seconds — ABNORMAL HIGH (ref 24–37)
aPTT: 74 seconds — ABNORMAL HIGH (ref 24–37)
aPTT: 76 seconds — ABNORMAL HIGH (ref 24–37)
aPTT: 81 seconds — ABNORMAL HIGH (ref 24–37)
aPTT: 84 seconds — ABNORMAL HIGH (ref 24–37)
aPTT: 86 seconds — ABNORMAL HIGH (ref 24–37)
aPTT: 87 seconds — ABNORMAL HIGH (ref 24–37)
aPTT: 92 seconds — ABNORMAL HIGH (ref 24–37)
aPTT: 94 seconds — ABNORMAL HIGH (ref 24–37)
aPTT: 95 seconds — ABNORMAL HIGH (ref 24–37)
aPTT: 96 seconds — ABNORMAL HIGH (ref 24–37)
aPTT: 107 seconds — ABNORMAL HIGH (ref 24–37)
aPTT: 57 seconds — ABNORMAL HIGH (ref 24–37)
aPTT: 67 seconds — ABNORMAL HIGH (ref 24–37)
aPTT: 68 seconds — ABNORMAL HIGH (ref 24–37)
aPTT: 70 seconds — ABNORMAL HIGH (ref 24–37)
aPTT: 74 seconds — ABNORMAL HIGH (ref 24–37)
aPTT: 80 seconds — ABNORMAL HIGH (ref 24–37)
aPTT: 80 seconds — ABNORMAL HIGH (ref 24–37)
aPTT: 81 seconds — ABNORMAL HIGH (ref 24–37)
aPTT: 89 seconds — ABNORMAL HIGH (ref 24–37)

## 2010-09-02 LAB — BASIC METABOLIC PANEL
BUN: 18 mg/dL (ref 6–23)
BUN: 19 mg/dL (ref 6–23)
BUN: 20 mg/dL (ref 6–23)
BUN: 21 mg/dL (ref 6–23)
BUN: 22 mg/dL (ref 6–23)
BUN: 24 mg/dL — ABNORMAL HIGH (ref 6–23)
BUN: 24 mg/dL — ABNORMAL HIGH (ref 6–23)
BUN: 24 mg/dL — ABNORMAL HIGH (ref 6–23)
BUN: 25 mg/dL — ABNORMAL HIGH (ref 6–23)
BUN: 27 mg/dL — ABNORMAL HIGH (ref 6–23)
BUN: 46 mg/dL — ABNORMAL HIGH (ref 6–23)
CO2: 27 mEq/L (ref 19–32)
CO2: 28 mEq/L (ref 19–32)
CO2: 30 mEq/L (ref 19–32)
CO2: 31 mEq/L (ref 19–32)
CO2: 18 mEq/L — ABNORMAL LOW (ref 19–32)
CO2: 18 mEq/L — ABNORMAL LOW (ref 19–32)
CO2: 19 mEq/L (ref 19–32)
CO2: 31 mEq/L (ref 19–32)
Calcium: 7 mg/dL — ABNORMAL LOW (ref 8.4–10.5)
Calcium: 8 mg/dL — ABNORMAL LOW (ref 8.4–10.5)
Calcium: 8.1 mg/dL — ABNORMAL LOW (ref 8.4–10.5)
Calcium: 8.3 mg/dL — ABNORMAL LOW (ref 8.4–10.5)
Calcium: 8.3 mg/dL — ABNORMAL LOW (ref 8.4–10.5)
Calcium: 8.3 mg/dL — ABNORMAL LOW (ref 8.4–10.5)
Calcium: 8.6 mg/dL (ref 8.4–10.5)
Calcium: 8.7 mg/dL (ref 8.4–10.5)
Calcium: 7.3 mg/dL — ABNORMAL LOW (ref 8.4–10.5)
Calcium: 7.5 mg/dL — ABNORMAL LOW (ref 8.4–10.5)
Calcium: 7.6 mg/dL — ABNORMAL LOW (ref 8.4–10.5)
Calcium: 7.8 mg/dL — ABNORMAL LOW (ref 8.4–10.5)
Chloride: 104 mEq/L (ref 96–112)
Chloride: 104 mEq/L (ref 96–112)
Chloride: 106 mEq/L (ref 96–112)
Chloride: 107 mEq/L (ref 96–112)
Chloride: 115 mEq/L — ABNORMAL HIGH (ref 96–112)
Chloride: 98 mEq/L (ref 96–112)
Creatinine, Ser: 0.64 mg/dL (ref 0.4–1.2)
Creatinine, Ser: 0.68 mg/dL (ref 0.4–1.2)
Creatinine, Ser: 0.68 mg/dL (ref 0.4–1.2)
Creatinine, Ser: 0.94 mg/dL (ref 0.4–1.2)
Creatinine, Ser: 0.96 mg/dL (ref 0.4–1.2)
Creatinine, Ser: 1.04 mg/dL (ref 0.4–1.2)
Creatinine, Ser: 0.99 mg/dL (ref 0.4–1.2)
Creatinine, Ser: 0.99 mg/dL (ref 0.4–1.2)
Creatinine, Ser: 1 mg/dL (ref 0.4–1.2)
Creatinine, Ser: 1.24 mg/dL — ABNORMAL HIGH (ref 0.4–1.2)
GFR calc Af Amer: >60 mL/min (ref >60)
GFR calc Af Amer: >60 mL/min (ref >60)
GFR calc Af Amer: >60 mL/min (ref >60)
GFR calc Af Amer: >60 mL/min (ref >60)
GFR calc Af Amer: 53 mL/min — ABNORMAL LOW (ref >60)
GFR calc Af Amer: 54 mL/min — ABNORMAL LOW (ref >60)
GFR calc Af Amer: >60 mL/min (ref >60)
GFR calc Af Amer: >60 mL/min (ref >60)
GFR calc non Af Amer: 54 mL/min — ABNORMAL LOW (ref >60)
GFR calc non Af Amer: 59 mL/min — ABNORMAL LOW (ref >60)
GFR calc non Af Amer: >60 mL/min (ref >60)
GFR calc non Af Amer: >60 mL/min (ref >60)
GFR calc non Af Amer: >60 mL/min (ref >60)
GFR calc non Af Amer: >60 mL/min (ref >60)
GFR calc non Af Amer: >60 mL/min (ref >60)
GFR calc non Af Amer: >60 mL/min (ref >60)
GFR calc non Af Amer: >60 mL/min (ref >60)
GFR calc non Af Amer: >60 mL/min (ref >60)
GFR calc non Af Amer: 45 mL/min — ABNORMAL LOW (ref >60)
GFR calc non Af Amer: 48 mL/min — ABNORMAL LOW (ref >60)
GFR calc non Af Amer: 54 mL/min — ABNORMAL LOW (ref >60)
GFR calc non Af Amer: 55 mL/min — ABNORMAL LOW (ref >60)
GFR calc non Af Amer: 56 mL/min — ABNORMAL LOW (ref >60)
Glucose, Bld: 113 mg/dL — ABNORMAL HIGH (ref 70–99)
Glucose, Bld: 114 mg/dL — ABNORMAL HIGH (ref 70–99)
Glucose, Bld: 120 mg/dL — ABNORMAL HIGH (ref 70–99)
Glucose, Bld: 128 mg/dL — ABNORMAL HIGH (ref 70–99)
Glucose, Bld: 132 mg/dL — ABNORMAL HIGH (ref 70–99)
Glucose, Bld: 165 mg/dL — ABNORMAL HIGH (ref 70–99)
Glucose, Bld: 191 mg/dL — ABNORMAL HIGH (ref 70–99)
Glucose, Bld: 97 mg/dL (ref 70–99)
Glucose, Bld: 119 mg/dL — ABNORMAL HIGH (ref 70–99)
Glucose, Bld: 127 mg/dL — ABNORMAL HIGH (ref 70–99)
Glucose, Bld: 139 mg/dL — ABNORMAL HIGH (ref 70–99)
Glucose, Bld: 142 mg/dL — ABNORMAL HIGH (ref 70–99)
Glucose, Bld: 143 mg/dL — ABNORMAL HIGH (ref 70–99)
Potassium: 3.2 mEq/L — ABNORMAL LOW (ref 3.5–5.1)
Potassium: 3.4 mEq/L — ABNORMAL LOW (ref 3.5–5.1)
Potassium: 3.4 mEq/L — ABNORMAL LOW (ref 3.5–5.1)
Potassium: 3.5 mEq/L (ref 3.5–5.1)
Potassium: 3.8 mEq/L (ref 3.5–5.1)
Potassium: 3.5 mEq/L (ref 3.5–5.1)
Potassium: 3.5 mEq/L (ref 3.5–5.1)
Potassium: 3.7 mEq/L (ref 3.5–5.1)
Potassium: 4 mEq/L (ref 3.5–5.1)
Sodium: 139 mEq/L (ref 135–145)
Sodium: 140 mEq/L (ref 135–145)
Sodium: 143 mEq/L (ref 135–145)
Sodium: 143 mEq/L (ref 135–145)
Sodium: 135 mEq/L (ref 135–145)
Sodium: 137 mEq/L (ref 135–145)
Sodium: 138 mEq/L (ref 135–145)
Sodium: 140 mEq/L (ref 135–145)
Sodium: 143 mEq/L (ref 135–145)

## 2010-09-02 LAB — URINE CULTURE
Colony Count: 10000
Colony Count: NO GROWTH

## 2010-09-02 LAB — CROSSMATCH: ABO/RH(D): A POS

## 2010-09-02 LAB — PROTIME-INR
INR: 1.3 (ref 0.00–1.49)
INR: 1.3 (ref 0.00–1.49)
INR: 1.6 — ABNORMAL HIGH (ref 0.00–1.49)
INR: 1.7 — ABNORMAL HIGH (ref 0.00–1.49)
INR: 2.2 — ABNORMAL HIGH (ref 0.00–1.49)
INR: 1.4 (ref 0.00–1.49)
INR: 1.4 (ref 0.00–1.49)
INR: 1.4 (ref 0.00–1.49)
Prothrombin Time: 16.2 seconds — ABNORMAL HIGH (ref 11.6–15.2)
Prothrombin Time: 16.4 seconds — ABNORMAL HIGH (ref 11.6–15.2)
Prothrombin Time: 16.5 seconds — ABNORMAL HIGH (ref 11.6–15.2)
Prothrombin Time: 16.5 seconds — ABNORMAL HIGH (ref 11.6–15.2)
Prothrombin Time: 19.1 seconds — ABNORMAL HIGH (ref 11.6–15.2)
Prothrombin Time: 19.8 seconds — ABNORMAL HIGH (ref 11.6–15.2)
Prothrombin Time: 21 seconds — ABNORMAL HIGH (ref 11.6–15.2)
Prothrombin Time: 28.1 seconds — ABNORMAL HIGH (ref 11.6–15.2)
Prothrombin Time: 15.8 seconds — ABNORMAL HIGH (ref 11.6–15.2)
Prothrombin Time: 16.1 seconds — ABNORMAL HIGH (ref 11.6–15.2)
Prothrombin Time: 17.5 seconds — ABNORMAL HIGH (ref 11.6–15.2)
Prothrombin Time: 18 seconds — ABNORMAL HIGH (ref 11.6–15.2)

## 2010-09-02 LAB — URINALYSIS, ROUTINE W REFLEX MICROSCOPIC
Bilirubin Urine: NEGATIVE
Glucose, UA: NEGATIVE mg/dL
Hgb urine dipstick: NEGATIVE
Ketones, ur: NEGATIVE mg/dL
Ketones, ur: 15 mg/dL — AB
Nitrite: NEGATIVE
Nitrite: POSITIVE — AB
Specific Gravity, Urine: 1.018 (ref 1.005–1.030)
Specific Gravity, Urine: 1.027 (ref 1.005–1.030)
Urobilinogen, UA: 0.2 mg/dL (ref 0.0–1.0)
pH: 5 (ref 5.0–8.0)
pH: 5.5 (ref 5.0–8.0)

## 2010-09-02 LAB — MAGNESIUM
Magnesium: 2.1 mg/dL (ref 1.5–2.5)
Magnesium: 2.2 mg/dL (ref 1.5–2.5)
Magnesium: 2.3 mg/dL (ref 1.5–2.5)
Magnesium: 2.6 mg/dL — ABNORMAL HIGH (ref 1.5–2.5)
Magnesium: 2 mg/dL (ref 1.5–2.5)
Magnesium: 2.2 mg/dL (ref 1.5–2.5)
Magnesium: 2.3 mg/dL (ref 1.5–2.5)
Magnesium: 2.3 mg/dL (ref 1.5–2.5)

## 2010-09-02 LAB — DIFFERENTIAL
Basophils Absolute: 0 10*3/uL (ref 0.0–0.1)
Basophils Relative: 0 % (ref 0–1)
Basophils Relative: 0 % (ref 0–1)
Eosinophils Absolute: 0 10*3/uL (ref 0.0–0.7)
Eosinophils Absolute: 0 10*3/uL (ref 0.0–0.7)
Eosinophils Relative: 0 % (ref 0–5)
Eosinophils Relative: 0 % (ref 0–5)
Lymphocytes Relative: 7 % — ABNORMAL LOW (ref 12–46)
Lymphs Abs: 1.1 10*3/uL (ref 0.7–4.0)
Lymphs Abs: 1.3 10*3/uL (ref 0.7–4.0)
Monocytes Absolute: 0.6 10*3/uL (ref 0.1–1.0)
Monocytes Absolute: 0.7 10*3/uL (ref 0.1–1.0)
Monocytes Absolute: 1 10*3/uL (ref 0.1–1.0)
Monocytes Relative: 4 % (ref 3–12)
Monocytes Relative: 4 % (ref 3–12)
Neutro Abs: 16.7 10*3/uL — ABNORMAL HIGH (ref 1.7–7.7)
Neutrophils Relative %: 88 % — ABNORMAL HIGH (ref 43–77)
Neutrophils Relative %: 89 % — ABNORMAL HIGH (ref 43–77)

## 2010-09-02 LAB — CARDIAC PANEL(CRET KIN+CKTOT+MB+TROPI)
CK, MB: 3.8 ng/mL (ref 0.3–4.0)
Relative Index: INVALID (ref 0.0–2.5)
Total CK: 47 U/L (ref 7–177)

## 2010-09-02 LAB — PHOSPHORUS
Phosphorus: 3.6 mg/dL (ref 2.3–4.6)
Phosphorus: 4 mg/dL (ref 2.3–4.6)
Phosphorus: 4.2 mg/dL (ref 2.3–4.6)
Phosphorus: 3.2 mg/dL (ref 2.3–4.6)
Phosphorus: 4 mg/dL (ref 2.3–4.6)
Phosphorus: 4 mg/dL (ref 2.3–4.6)
Phosphorus: 4.1 mg/dL (ref 2.3–4.6)
Phosphorus: 4.9 mg/dL — ABNORMAL HIGH (ref 2.3–4.6)
Phosphorus: 5.2 mg/dL — ABNORMAL HIGH (ref 2.3–4.6)

## 2010-09-02 LAB — CULTURE, RESPIRATORY W GRAM STAIN

## 2010-09-02 LAB — URINE MICROSCOPIC-ADD ON

## 2010-09-02 LAB — PREPARE PLATELETS

## 2010-09-02 LAB — HEPATIC FUNCTION PANEL
ALT: 42 U/L — ABNORMAL HIGH (ref 0–35)
AST: 27 U/L (ref 0–37)
Albumin: 2 g/dL — ABNORMAL LOW (ref 3.5–5.2)
Alkaline Phosphatase: 235 U/L — ABNORMAL HIGH (ref 39–117)
Bilirubin, Direct: 0.2 mg/dL (ref 0.0–0.3)
Bilirubin, Direct: 0.2 mg/dL (ref 0.0–0.3)
Total Bilirubin: 0.6 mg/dL (ref 0.3–1.2)
Total Bilirubin: 0.7 mg/dL (ref 0.3–1.2)

## 2010-09-02 LAB — CULTURE, BLOOD (ROUTINE X 2)
Culture: NO GROWTH
Culture: NO GROWTH

## 2010-09-02 LAB — TYPE AND SCREEN: ABO/RH(D): A POS

## 2010-09-02 LAB — MYOGLOBIN, SERUM: Myoglobin: 689 ng/mL — ABNORMAL HIGH (ref <111)

## 2010-09-02 LAB — POCT I-STAT 7, (LYTES, BLD GAS, ICA,H+H)
Acid-base deficit: 8 mmol/L — ABNORMAL HIGH (ref 0.0–2.0)
Calcium, Ion: 1.12 mmol/L (ref 1.12–1.32)
HCT: 21 % — ABNORMAL LOW (ref 36.0–46.0)
Potassium: 4.2 mEq/L (ref 3.5–5.1)
pO2, Arterial: 330 mmHg — ABNORMAL HIGH (ref 80.0–100.0)

## 2010-09-02 LAB — CHROMOGENIC FACTOR X (DUKE LAB)
CHROM XA: 83.3 % — ABNORMAL LOW (ref 86–146)
CHROM XA: 83.3 % — ABNORMAL LOW (ref 86–146)

## 2010-09-02 LAB — CK: Total CK: 199 U/L — ABNORMAL HIGH (ref 7–177)

## 2010-09-02 LAB — RETICULOCYTES
RBC.: 2 MIL/uL — ABNORMAL LOW (ref 3.87–5.11)
Retic Count, Absolute: 132 10*3/uL (ref 19.0–186.0)
Retic Ct Pct: 6.6 % — ABNORMAL HIGH (ref 0.4–3.1)

## 2010-09-02 LAB — OSMOLALITY, URINE: Osmolality, Ur: 463 mOsm/kg (ref 390–1090)

## 2010-09-02 LAB — HEPARIN ANTIBODY SCREEN: Heparin Antibody Screen: POSITIVE

## 2010-09-02 LAB — MYOGLOBIN, URINE: Myoglobin, Ur: <27 mcg/L (ref <28)

## 2010-09-03 LAB — CBC
HCT: 16.1 % — ABNORMAL LOW (ref 36.0–46.0)
HCT: 25.3 % — ABNORMAL LOW (ref 36.0–46.0)
HCT: 25.7 % — ABNORMAL LOW (ref 36.0–46.0)
HCT: 26.3 % — ABNORMAL LOW (ref 36.0–46.0)
Hemoglobin: 11 g/dL — ABNORMAL LOW (ref 12.0–15.0)
Hemoglobin: 8.2 g/dL — ABNORMAL LOW (ref 12.0–15.0)
Hemoglobin: 8.9 g/dL — ABNORMAL LOW (ref 12.0–15.0)
Hemoglobin: 9 g/dL — ABNORMAL LOW (ref 12.0–15.0)
MCHC: 35 g/dL (ref 30.0–36.0)
MCV: 97.6 fL (ref 78.0–100.0)
MCV: 97.8 fL (ref 78.0–100.0)
Platelets: 20 10*3/uL — CL (ref 150–400)
Platelets: 46 10*3/uL — CL (ref 150–400)
RBC: 1.64 MIL/uL — ABNORMAL LOW (ref 3.87–5.11)
RBC: 2.38 MIL/uL — ABNORMAL LOW (ref 3.87–5.11)
RBC: 3.1 MIL/uL — ABNORMAL LOW (ref 3.87–5.11)
RDW: 12.5 % (ref 11.5–15.5)
RDW: 12.9 % (ref 11.5–15.5)
RDW: 15.2 % (ref 11.5–15.5)
RDW: 15.3 % (ref 11.5–15.5)
WBC: 20.4 10*3/uL — ABNORMAL HIGH (ref 4.0–10.5)
WBC: 22.5 10*3/uL — ABNORMAL HIGH (ref 4.0–10.5)
WBC: 23.2 10*3/uL — ABNORMAL HIGH (ref 4.0–10.5)
WBC: 28.7 10*3/uL — ABNORMAL HIGH (ref 4.0–10.5)

## 2010-09-03 LAB — RETICULOCYTES
RBC.: 2.3 MIL/uL — ABNORMAL LOW (ref 3.87–5.11)
Retic Count, Absolute: 73.6 10*3/uL (ref 19.0–186.0)
Retic Ct Pct: 3.2 % — ABNORMAL HIGH (ref 0.4–3.1)

## 2010-09-03 LAB — OSMOLALITY: Osmolality: 274 mOsm/kg — ABNORMAL LOW (ref 275–300)

## 2010-09-03 LAB — COMPREHENSIVE METABOLIC PANEL
ALT: 37 U/L — ABNORMAL HIGH (ref 0–35)
ALT: 46 U/L — ABNORMAL HIGH (ref 0–35)
ALT: 53 U/L — ABNORMAL HIGH (ref 0–35)
AST: 19 U/L (ref 0–37)
AST: 32 U/L (ref 0–37)
Alkaline Phosphatase: 214 U/L — ABNORMAL HIGH (ref 39–117)
Alkaline Phosphatase: 228 U/L — ABNORMAL HIGH (ref 39–117)
BUN: 17 mg/dL (ref 6–23)
CO2: 17 mEq/L — ABNORMAL LOW (ref 19–32)
CO2: 18 mEq/L — ABNORMAL LOW (ref 19–32)
CO2: 20 mEq/L (ref 19–32)
Chloride: 101 mEq/L (ref 96–112)
Chloride: 102 mEq/L (ref 96–112)
Chloride: 107 mEq/L (ref 96–112)
Creatinine, Ser: 0.68 mg/dL (ref 0.4–1.2)
Creatinine, Ser: 0.83 mg/dL (ref 0.4–1.2)
GFR calc Af Amer: >60 mL/min (ref >60)
GFR calc Af Amer: >60 mL/min (ref >60)
GFR calc non Af Amer: >60 mL/min (ref >60)
GFR calc non Af Amer: >60 mL/min (ref >60)
Glucose, Bld: 99 mg/dL (ref 70–99)
Potassium: 3.1 mEq/L — ABNORMAL LOW (ref 3.5–5.1)
Sodium: 129 mEq/L — ABNORMAL LOW (ref 135–145)
Total Bilirubin: 0.7 mg/dL (ref 0.3–1.2)
Total Bilirubin: 1 mg/dL (ref 0.3–1.2)
Total Protein: 6 g/dL (ref 6.0–8.3)

## 2010-09-03 LAB — GLUCOSE, CAPILLARY
Glucose-Capillary: 104 mg/dL — ABNORMAL HIGH (ref 70–99)
Glucose-Capillary: 123 mg/dL — ABNORMAL HIGH (ref 70–99)
Glucose-Capillary: 128 mg/dL — ABNORMAL HIGH (ref 70–99)
Glucose-Capillary: 13 mg/dL — CL (ref 70–99)
Glucose-Capillary: 162 mg/dL — ABNORMAL HIGH (ref 70–99)
Glucose-Capillary: 211 mg/dL — ABNORMAL HIGH (ref 70–99)
Glucose-Capillary: 252 mg/dL — ABNORMAL HIGH (ref 70–99)
Glucose-Capillary: 52 mg/dL — ABNORMAL LOW (ref 70–99)
Glucose-Capillary: 61 mg/dL — ABNORMAL LOW (ref 70–99)
Glucose-Capillary: 63 mg/dL — ABNORMAL LOW (ref 70–99)
Glucose-Capillary: 64 mg/dL — ABNORMAL LOW (ref 70–99)
Glucose-Capillary: 69 mg/dL — ABNORMAL LOW (ref 70–99)
Glucose-Capillary: 71 mg/dL (ref 70–99)
Glucose-Capillary: 109 mg/dL — ABNORMAL HIGH (ref 70–99)

## 2010-09-03 LAB — CULTURE, BLOOD (ROUTINE X 2)

## 2010-09-03 LAB — URINALYSIS, ROUTINE W REFLEX MICROSCOPIC
Hgb urine dipstick: NEGATIVE
Nitrite: NEGATIVE
Protein, ur: 30 mg/dL — AB
Urobilinogen, UA: 1 mg/dL (ref 0.0–1.0)

## 2010-09-03 LAB — POCT I-STAT 3, ART BLOOD GAS (G3+)
Acid-base deficit: 8 mmol/L — ABNORMAL HIGH (ref 0.0–2.0)
Acid-base deficit: 9 mmol/L — ABNORMAL HIGH (ref 0.0–2.0)
Bicarbonate: 14.3 mEq/L — ABNORMAL LOW (ref 20.0–24.0)
O2 Saturation: 100 %
O2 Saturation: 94 %
TCO2: 15 mmol/L (ref 0–100)
pCO2 arterial: 20.6 mmHg — ABNORMAL LOW (ref 35.0–45.0)
pO2, Arterial: 227 mmHg — ABNORMAL HIGH (ref 80.0–100.0)
pO2, Arterial: 64 mmHg — ABNORMAL LOW (ref 80.0–100.0)

## 2010-09-03 LAB — PROTIME-INR
INR: 1.2 (ref 0.00–1.49)
INR: 1.4 (ref 0.00–1.49)
INR: 1.4 (ref 0.00–1.49)
Prothrombin Time: 15.2 seconds (ref 11.6–15.2)
Prothrombin Time: 18.1 seconds — ABNORMAL HIGH (ref 11.6–15.2)

## 2010-09-03 LAB — TYPE AND SCREEN
ABO/RH(D): A POS
Antibody Screen: NEGATIVE

## 2010-09-03 LAB — APTT
aPTT: 34 seconds (ref 24–37)
aPTT: 47 seconds — ABNORMAL HIGH (ref 24–37)

## 2010-09-03 LAB — PREPARE PLATELETS

## 2010-09-03 LAB — BASIC METABOLIC PANEL
BUN: 29 mg/dL — ABNORMAL HIGH (ref 6–23)
CO2: 20 mEq/L (ref 19–32)
GFR calc non Af Amer: 53 mL/min — ABNORMAL LOW (ref >60)
Glucose, Bld: 144 mg/dL — ABNORMAL HIGH (ref 70–99)
Glucose, Bld: 170 mg/dL — ABNORMAL HIGH (ref 70–99)
Potassium: 3.6 mEq/L (ref 3.5–5.1)
Potassium: 3.9 mEq/L (ref 3.5–5.1)
Sodium: 135 mEq/L (ref 135–145)
Sodium: 138 mEq/L (ref 135–145)

## 2010-09-03 LAB — FIBRINOGEN
Fibrinogen: 279 mg/dL (ref 204–475)
Fibrinogen: 320 mg/dL (ref 204–475)
Fibrinogen: 334 mg/dL (ref 204–475)

## 2010-09-03 LAB — CREATININE, SERUM
Creatinine, Ser: 0.95 mg/dL (ref 0.4–1.2)
GFR calc Af Amer: >60 mL/min (ref >60)
GFR calc non Af Amer: 60 mL/min — ABNORMAL LOW (ref >60)

## 2010-09-03 LAB — PREPARE RBC (CROSSMATCH)

## 2010-09-03 LAB — URINE CULTURE: Colony Count: 60000

## 2010-09-03 LAB — HEPARIN LEVEL (UNFRACTIONATED)
Heparin Unfractionated: 0.23 IU/mL — ABNORMAL LOW (ref 0.30–0.70)
Heparin Unfractionated: <0.1 IU/mL — ABNORMAL LOW (ref 0.30–0.70)

## 2010-09-03 LAB — TSH: TSH: 7.888 u[IU]/mL — ABNORMAL HIGH (ref 0.350–4.500)

## 2010-09-03 LAB — T4, FREE: Free T4: 0.69 ng/dL — ABNORMAL LOW (ref 0.80–1.80)

## 2010-09-03 LAB — HEPATIC FUNCTION PANEL
Alkaline Phosphatase: 132 U/L — ABNORMAL HIGH (ref 39–117)
Bilirubin, Direct: 1.3 mg/dL — ABNORMAL HIGH (ref 0.0–0.3)
Indirect Bilirubin: 1.8 mg/dL — ABNORMAL HIGH (ref 0.3–0.9)
Total Bilirubin: 3.1 mg/dL — ABNORMAL HIGH (ref 0.3–1.2)

## 2010-09-03 LAB — URINE MICROSCOPIC-ADD ON

## 2010-09-03 LAB — PLATELET COUNT
Platelets: 58 10*3/uL — ABNORMAL LOW (ref 150–400)
Platelets: 65 10*3/uL — ABNORMAL LOW (ref 150–400)

## 2010-09-03 LAB — HEPARIN ANTIBODY SCREEN: Heparin Antibody Screen: NEGATIVE

## 2010-09-03 LAB — SAVE SMEAR

## 2010-09-03 LAB — VANCOMYCIN, TROUGH: Vancomycin Tr: 14.6 ug/mL (ref 10.0–20.0)

## 2010-09-03 LAB — IRON AND TIBC
Iron: 34 ug/dL — ABNORMAL LOW (ref 42–135)
TIBC: 207 ug/dL — ABNORMAL LOW (ref 250–470)

## 2010-09-03 LAB — VITAMIN B12: Vitamin B-12: >2000 pg/mL — ABNORMAL HIGH (ref 211–911)

## 2010-09-03 LAB — HEMOGLOBIN AND HEMATOCRIT, BLOOD: HCT: 26.1 % — ABNORMAL LOW (ref 36.0–46.0)

## 2010-09-03 LAB — MAGNESIUM: Magnesium: 2 mg/dL (ref 1.5–2.5)

## 2010-09-03 LAB — HAPTOGLOBIN: Haptoglobin: 37 mg/dL (ref 16–200)

## 2010-09-03 LAB — FERRITIN: Ferritin: 346 ng/mL — ABNORMAL HIGH (ref 10–291)

## 2010-09-03 LAB — BILIRUBIN, FRACTIONATED(TOT/DIR/INDIR): Indirect Bilirubin: 1 mg/dL — ABNORMAL HIGH (ref 0.3–0.9)

## 2010-09-03 LAB — LACTATE DEHYDROGENASE: LDH: 248 U/L (ref 94–250)

## 2010-09-03 LAB — CORTISOL: Cortisol, Plasma: 22.5 ug/dL

## 2010-10-07 NOTE — Op Note (Signed)
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   FINDINGS:  A necrotic right index finger distal phalanx and a necrotic  left forearm amputation stump.   SURGEON:  Loreta Ave, MD   ASSISTANT:  None.   TOURNIQUET TIME:  8 minutes at 250 mmHg on the right upper extremity and  26 minutes at 250 mmHg on the left upper extremity.   CLINICAL INDICATION:  Alyssandra Hulsebus is a 65 year old right-hand dominant  female with endometrial cancer.  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  This resulted in thrombocytopenia and her SVC syndrome resulted in  extensive clot throughout her upper extremities.  I saw her initially  for compartment syndrome of the left hand and forearm, which were  released with digital hand and forearm compartment releases.  She went  on to lose her left hand and forearm.  Previously, I did a left forearm  amputation and most of the skin on the amputation stump became necrotic  postoperatively.  Also over this time period, she developed necrotic  right index finger with dry gangrene.  She presents at this time for  amputation of the right index fingertip and revision amputation of the  left arm.   After discussion of the risks of surgery with Tamzin and her  husband,  which include but are not limited to bleeding, infection, damage to  nearby structures, wound healing problems, skin loss, and the need for  more proximal amputations, Harpreet understands these risks and desires to  proceed.   DESCRIPTION OF THE OPERATION:  The patient was brought from the ICU to  the operating room on a ventilator via tracheostomy.  She was switched  to the operating room ventilator and general anesthesia was induced.  Her left upper extremity was prepped with DuraPrep and draped into a  sterile field to the level of the axilla.  Her right upper extremity was  prepped with Betadine scrub and paint and a well-padded pneumatic  tourniquet was placed on the right arm.  Attention was first turned to  the right upper extremity.  The right upper extremity was exsanguinated  with an Esmarch bandage and the tourniquet inflated to 250 mmHg.  The  right index fingertip was necrotic distal to the DIP joint.  The area of  necrosis in the soft tissue was excised with a 15 blade.  Next, the  DIP  joint was disarticulated.  The middle phalanx was rongeured back  approximately 0.5 cm and smoothed with a rasp.  The skin was found to  fold from radial to ulnar over the end of the finger without tension on  the skin.  The tourniquet was deflated and bleeding was controlled with  bipolar electrocautery.  Next, the amputation stump was closed with 4-0  interrupted chromic sutures.  Xeroform and dry sterile dressing with  Kerlix, Kling, and 1-inch Coban wrap was applied.  Sponge and needle  counts reported as correct.   Attention was then turned to the left upper extremity.  Her sutures were  removed, and the wound was inspected.  Most of the skin on her  amputation stump was found to be necrotic and nonviable.  The upper  extremity then had a sterile tourniquet placed on it as high as possible  on the arm.  The remainder of the forearm and arm was exsanguinated with  an Esmarch  bandage and the tourniquet was inflated to 250 mmHg on the  left upper extremity.  An incision was made into the skin on the medial  aspect longitudinally for 6 cm proximal to the elbow.  The skin flaps  were raised off of the fascial structures and off of the elbow joint to  create thick skin flaps.  Next, the elbow was disarticulated with a 10  blade.  Elevators were used to strip soft tissue and periosteum off of  the humerus for 4 cm proximal to the elbow joint proximally at the level  of the humeral metaphyseal flare.  Next, muscular attachments to the  forearm were divided with electrocautery as were attachments to the  distal humerus.  Next, the humerus was divided 4 cm proximal to the  elbow joint in transverse fashion with a sagittal saw.  Next, the  median, radial, and ulnar nerves were bipolared with electrocautery and  retracted out of the wound.  The tourniquet was then deflated and the  brachial artery and the brachial vein were clamped proximally and stick  tied with 2-0 silk sutures.  Next, hemostasis of the wound was obtained  with electrocautery.  The wound was then irrigated with 1 L of normal  saline and hemostasis again checked.  Next, generous skin flaps were  then cut, and the skin was folded from the lateral to the medial aspect  with a laterally based skin flap covering the distal stump.  Again  hemostasis was obtained after transecting the skin with a 10 blade.  There was no tension on the skin closure and all the skin bled well when  cut.  Next, the skin was closed with interrupted 2-0 nylon sutures and a  19-French round Blake drain was placed through the medial most incision.  This was secured to the skin with a silk suture.  Sponge and needle  counts reported as correct x2.  Xeroform was applied to the amputation  stump as were 4x4s, Kerlix wrap, and a 4-inch Ace bandage.  The patient  tolerated the procedure well and was transferred back to the ICU in  stable  condition.  Estimated blood loss was approximately 200 mL and IV  fluids were 700 mL.      Loreta Ave, MD  Electronically Signed     CF/MEDQ  D:  10/17/2008  T:  10/18/2008  Job:  [id number]

## 2010-10-07 NOTE — Op Note (Signed)
[name], [name]                 ACCOUNT NO.:  [id number]   MEDICAL RECORD NO.:  [id number]          PATIENT TYPE:  INP   LOCATION:  2108                         FACILITY:  [facility]   PHYSICIAN:  Loreta Ave, MD DATE OF BIRTH:  1946/03/05   DATE OF PROCEDURE:  09/21/2008  DATE OF DISCHARGE:                               OPERATIVE REPORT   PREOPERATIVE DIAGNOSIS:  Compartment syndrome of the left hand.  SVC syndrome   POSTOPERATIVE DIAGNOSIS:  Compartment syndrome of the left hand.  SVC syndrome with extensive clot throughout the veins of the left upper  extremity   ADDITIONAL DIAGNOSIS POSTOPERATIVELY:  Compartment syndrome of the left  forearm.   ESTIMATED BLOOD LOSS:  300 mL   INTRAVENOUS FLUIDS:  1 liter.   URINE OUTPUT:  250 mL,   1 liter of blood was given, 1 bag of platelets was given.   DRAINS:  None.   COMPLICATIONS:  None.   CLINICAL INDICATIONS:  [name] is a 65 year old right-hand-dominant  female who has developed SVC syndrome secondary to a right chest Port-A-  Cath and malignancy.  She was at an outside hospital when thrombolytics  were given for her SVC syndrome which caused an increase in edema of her  upper extremities.  Her left hand has become cyanotic, cold, and painful  over the last few hours.  The patient is really unable to stay along the  syndrome has been evolving.   After discussion of the urgency of compartment release to prevent  compartment syndrome, the patient was interested in proceeding with  surgery.  She and her family were informed that the risks of surgery  include but are not limited to bleeding, infection, damage to nearby  structures, failure to prevent the compartment syndrome if it had  previously evolved, the need for eventual hand or forearm amputation.  The patient and her family understand these risks and desired to  proceed.   DESCRIPTION OF OPERATION:  The patient was brought to the operating room  from the  ICU and placed in the supine position on the operating room  table.  After a smooth and routine induction of general anesthesia, the  left upper extremity was prepped with DuraPrep and draped into a sterile  field.  A well-padded pneumatic tourniquet had been placed in the arm.  Initially, the left upper extremity was draped into a sterile field to  level of the arm.  It was exsanguinated with an Esmarch bandage and the  tourniquet inflated at 250 mmHg.  Attention was first turned to the hand  compartment release and longitudinal incisions were made over the dorsum  of the index metacarpal dorsum of the ring finger metacarpal and along  the glabrous border of the thenar and the hypothenar eminences.  Dissection proceeded deeply in each case with blunt dissection and the  fascia of the compartment was released with a pair of tenotomy scissors.  Initially, the first web space was entered dorsally and split  longitudinally.  Next, the second interdigital space between the index  and long finger was bluntly dissected with  the tenotomy scissors and the  fascia was incised longitudinally.  This was again done between the long  finger and the ring finger.  Using tenotomy scissors along the  hypothenar eminence and then along the thumb.  All the musculature in  the hand was empurpled and remained empurpled.  It did respond to  electrical stimulation with Bovie cautery by twitching.  Next, the  tourniquet was deflated.  The muscle on the hand did not pink during the  procedure.  At this point, the skin of the hand was notably improving in  its color and there was no longer quite as cyanotic as preoperatively.  However, the fingers remained cyanotic.  For this reason, mid axial  incisions were made along the fingers to release each finger  individually.  One was made along the ulnar border of the index finger,  the radial border of the long finger, ring finger, and small finger.  One was made along  the radial border of the thumb as well over both of  the phalanges.  The skin of each of these incisions was incised with a  15 blade and then blunt dissection was carried deeply to release fascial  attachments and opened up the fingers.  The fingers did become more pink  and less congested after doing this.  Next, the compartmental pressures  were measured at the forearm and found to be 47 mmHg.  Her systolic  blood pressure at this point was 60 and so decision was made to formally  open up compartment to the left forearm as well.  A carpal tunnel  release was included along with the standard volar compartmental release  lazy-S configuration.  A longitudinal incision was made over the  extensor wad.  The skin was incised along the carpal tunnel with a 15  blade and carpal tunnel was released under direct vision with a 15  blade.  The antebrachial fascia was split with tenotomy scissors.  Next,  the volar incision was made which started ulnarly at the level of the  wrist crease and then curved up in the radial direction at the mid  forearm and then back towards the medial epicondyle.  The fascia was  incised in the muscle and the forearm was pink.  The deeper musculature  was checked under the FDS and this was found to be pink as well so it  was not released separately.  Next, a 12-cm longitudinal incision was  made over the extensor wad with a 10 blade.  Dissection proceeded down  with electrocautery and the extensor wad had its fascia incised with a  pair of tenotomy scissors and this was split longitudinally throughout  the length of the forearm.  The extensor musculature was pink as well.  Next, all wounds were copiously irrigated with normal saline and  hemostasis was obtained with a combination of Bovie electrocautery and  bipolar electrocautery.  The carpal tunnel incision was then closed with  4-0 interrupted nylon sutures as was the transverse component at the  wrist crease.  The  most proximal 6 cm of the forearm incision was closed  with interrupted 2-0 nylon sutures.  The dorsal incision was closed with  nylon interrupted 2-0 sutures.  The volar flap of the forearm was tacked  down about 70% away across the volar forearm with Vicryl sutures to the  forearm musculature.  All the hand incisions were left open as were the  finger incisions.  Xeroform was applied to all  the wounds as was a soft  sterile bandage.  The patient was then extubated and transported to the  recovery room in critical condition.      Loreta Ave, MD  Electronically Signed     CF/MEDQ  D:  09/21/2008  T:  09/22/2008  Job:  [id number]

## 2010-10-07 NOTE — Consult Note (Signed)
NAMEANAHY, [name]                 ACCOUNT NO.:  [id number]   MEDICAL RECORD NO.:  [id number]          PATIENT TYPE:  INP   LOCATION:  2108                         FACILITY:  [facility]   PHYSICIAN:  Casimiro Needle L. Reynolds, M.D.DATE OF BIRTH:  [birth year]   DATE OF CONSULTATION:  09/20/2008  DATE OF DISCHARGE:                                 CONSULTATION   CHIEF COMPLAINT:  Altered mental status.   [name] is a 65 year old woman patient of oncologist, Dr. Cleone Slim, who  has been transferred to from [facility] for evaluation of her  superior vena cava syndrome.  The patient at this time has been  diagnosed with clear cell uterine carcinoma stage Ib, and she is status  post single treatment of Taxol and carboplatin chemotherapy.  First  cycle was on September 03, 2008.  The patient was admitted to [facility] on  September 18, 2008, status post right trimalleolar fracture, in which it was  noticed platelets had dropped noticeably to 10,000.  On September 17, 2008,  while she was at [facility], she received transfusion of platelets, which  brought her platelet accumulation number to 60,000, however, on September 18, 2008, date of admission to [facility], platelets dropped to 40,000.  By September 19, 2008, platelets have dropped down to 20,000 and on September 20, 2008, the patient's platelets dropped to 11,000.  Apparently  according to husband while she was at [facility] ICU, the patient had  developed some confusion, which continued through transfer to [facility], which is the reason for consultation today.  While at [facility], CT of chest was performed and showed to be negative for  PE.  A 2-D echo was performed, which showed no PFO and left ventricular  ejection fraction of 65-70%.  Arterial blood gas was performed, which  showed pH of 7.44, PCO2 of 20.6, PO2 of 64.  The patient in lieu of  possibility of TTP, the patient's PT/INR were drawn, which showed 17.4  PT and INR equaling 1.4,  although her bilirubin was elevated, majority  of the elevated bilirubin was direct, not direct.  A heparin antibody  screen was performed, which showed negative.  Upon examination, the  patient apparently was less confused today, however, still was unable to  answer where she was, the date, her age, showing mild delirium.  The  patient was able to follow commands without any difficulty and  understood events that were going around in the room.  At the time of  examination, the patient was being prepared to be transferred to  Interventional Radiology for mechanical thrombolysis.   PAST MEDICAL HISTORY:  Hypothyroidism.   PAST SURGICAL HISTORY:  1. Total abdominal hysterectomy, bilateral salpingo-oophorectomy on      June 15, 2008.  2. Port-a-Cath placement in April 2010.  3. Hypertension.  4. Superior vena cava thrombosis.  5. Clear cell uterine cancer status post one treatment of Taxol and      carboplatin chemotherapy.   MEDICATIONS:  1. Vancomycin per ICU protocol.  2. Fortaz/Tazidime 1 g IV q.8 h.  3. Cardizem 60 mg q.6 h.  4. Risperdal 0.25 mg p.o. nightly.  5. Protonix 40 mg p.o. daily.  6. NovoLog sliding scale with hyperglycemia protocol.   ALLERGIES:  DOXYCYCLINE, which causes jaundice.   FAMILY HISTORY:  Significant for maternal grandmother with breast cancer  at an elderly age.  Paternal aunt had bladder cancer.  Paternal uncle  had colon cancer in his 21s.  One brother died from brain cancer in his  94s.   SOCIAL HISTORY:  The patient is married.  She does not drink alcohol or  smoke.  She has acquired a bachelor's degree from a university in  [place].  She has 3 children and is a retired Runner, broadcasting/film/video.   REVIEW OF SYSTEMS:  All negative with the exception of above.   PHYSICAL EXAMINATION:  GENERAL:  The patient is alert.  She is not  oriented.  She is able to follow commands and enunciates well.  She does  not appear agitated, but she does appear confused.   ABDOMEN:  Nontender.  Soft.  Bowel sounds in all 4 quadrants.  RESPIRATORY:  Clear to auscultation bilaterally.  No rhonchi or  wheezing.  CARDIOVASCULAR:  S1 and S2.  Regular rate and rhythm.  No murmurs.  NECK:  Supple with negative bruits.  NEUROLOGIC:  As stated, the patient is alert.  She is confused.  She is  not oriented to date, time, age, or month, but she is able to follow  commands, both verbally and physically.  Cranial nerves:  Pupils are equal, round, and reactive to accommodate  and light.  Conjugate gaze.  Extraocular muscles are intact.  Visual  fields intact.  Face symmetrical.  Tongue is midline.  Uvula is midline.  Sensation V1 through V3 is fully intact.  Shoulder shrug, head turn is  fully intact.  Coordination:  Cerebellum, finger-to-nose smooth without any dysmetria.  Heel-to-shin, the patient was inattentive and unable to provide this  information.  Fine motor movements are slow; however, they are not  clumsy nor they are dystaxic.  Gait:  Unable to be performed.  Motor:  The patient moves all extremities purposefully and spontaneously  to command with the exception of her right ankle which is in a cast  secondary to a trimalleolar fracture.  Her strength globally is 4/5.  Her grips are weak bilaterally.  She shows no fasciculations or tremors.  She does have normal bulk and tone.  It should be noted that her skin is  warm, dry; however, her bilateral extremities are swollen and distal  aspects of her fingers and her hand shows cyanosis.  Drift is negative  bilaterally in upper and lower extremities.  Deep tendon reflexes are 2+ throughout with a left downgoing toe.  Sensation:  Fully intact to pinprick and light touch.  Vibratory  sensation is intact in bilateral upper extremities in the left lower  extremity.   LABORATORY DATA:  As stated, blood gas showed a pH of 7.44, PCO2 of  20.6, PO2 is 64.  Reticular percent is 3.2.  PT is 17.4, INR 1.4.  UA  was  positive for leukocyte esterase.  Sodium 133, potassium 4.2,  chloride 107, bicarb 18, BUN 23, creatinine 0.83, glucose is 123.  White  blood cell count is 28.7, hemoglobin and hematocrit 8.2 and 23.2,  platelet is 11, as stated has been trending in a downward fashion.  Ammonia is 46.  AST 19, ALT 37.  TSH 6.07, T3 shows 1.5.   IMAGING  AND TESTS:  1. CT was negative.  2. A 2-D echo showed left ventricular ejection fraction of 60-70%      showing no PFO.  3. MRI was not able to be obtained at this point.   ASSESSMENT:  At this time, is a 65 year old female with onset of  confusion while at [facility] over the weekend, approximately 5  days ago, although some improvement continues, the patient remains  confused at this point.  Assessment would be encephalopathy with  questionable source.  Cannot completely rule out thrombotic  thrombocytopenic purpura.  Other differential diagnoses include  paraneoplastic syndrome and subclinical seizures.   RECOMMENDATIONS:  At this time, we will obtain an EEG for the morning.  Recommend MRI when the patient is stable and a paraneoplastic panel.  We  will continue to follow this patient and her confusion while she is in-  house and update with any recommendations.  Thank you very much for this  consultation.     ______________________________  Felicie Morn, PA-C      Marolyn Hammock. Thad Ranger, M.D.  Electronically Signed    DS/MEDQ  D:  09/20/2008  T:  09/21/2008  Job:  [id number]   cc:   Dr. Maple Hudson

## 2010-10-07 NOTE — Op Note (Signed)
NAMEPETRONELLA, SHUFORD                 ACCOUNT NO.:  [id number]   MEDICAL RECORD NO.:  [id number]          PATIENT TYPE:  INP   LOCATION:  2108                         FACILITY:  [facility]   PHYSICIAN:  Loreta Ave, MD DATE OF BIRTH:  1945-07-26   DATE OF PROCEDURE:  09/26/2008  DATE OF DISCHARGE:                               OPERATIVE REPORT   PROCEDURE PERFORMED:  Left forearm amputation.   SURGEON:  Loreta Ave, MD   ANESTHESIA:  General endotracheal.   ESTIMATED BLOOD LOSS:  150 mL.   URINE OUTPUT:  Per Anesthesia.   IV FLUIDS:  Two units of packed red blood cells abd one bag of  platelets.   DRAINS:  Jackson-Pratt x1.   TOURNIQUET TIME:  Zero.   CLINICAL INDICATION:  Suttyn Cryder is a 65 year old Caucasian female with  multiple medical problems.  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  Subsequently, she  underwent thrombolysis and developed compartment syndrome of her left  upper extremity.  I was called as the hand surgeon on-call to evaluate  her ischemic left hand and forearm last week and immediately performed  forearm hand and finger fasciotomies.  Subsequently, her left hand and  forearm went on to become progressively dysvascular secondary to her  decreased venous return.   After discussion of the risks of amputation, which include but are not  limited to the need for more proximal amputation, bleeding, infection,  damage to the nearby structures, failure to heal but wound, and the need  for future surgery, Elvera's family including her husband understand  these risks and desire to proceed.   DESCRIPTION OF OPERATION:  The patient was brought to the operating room  directly from the ICU and transferred to the supine position on the  operating room table.  After transfer of her endotracheal anesthesia  from bag-mask ventilation and  transported to the ventilator in the  operating room, the patient's left upper extremity was prepped with  Betadine and draped into a sterile field.  Initially, her skin was  assessed for viability.  A dorsal flap was created because of the  previous volar extensive fasciotomy.  The ulnar aspect of her previous  volar fasciotomy was nonviable.  Therefore, dorsal skin was used.  This  was cut transversely initially at the midforearm, however, there was no  bleeding at this level from the skin.  So it was dissected.  The skin  was cut more proximally.  The anticipated length of radius and ulna to  the left was 8 cm, so this was marked on the patient's skin and the  dorsal skin flap was marked transversely 8 cm distal to this.  This was  incised with a 10 blade and found not to bleed from the proximal edge.  It was cut at 2 cm more proximally and found to bleed from the proximal  edge.  Next, dissection proceeded at the level of the superficial fascia  to free the skin from the underlying musculature.  The  majority of her  flexor and extensor musculature was pink, but without a contractile  response to Bovie electrocautery.  There was bleeding from all muscle  bellies as they were transected.  Dissection began on the volar half of  the forearm with electrocautery turned up to 45.  The radial and ulnar  arteries were clamped with Kelly clamps, divided with scissors, and tied  with 2-0 silk.  All veins in the left forearm that were encountered were  full of clot.  Next, the extensor musculature was cut with Bovie  electrocautery and found to be pink with noncontractile.  Next,  periosteum was stripped off of the radius and ulna moving proximally and  leaving at least 8 cm of radius in ulna.  Next, the bones were  transected with a sagittal saw.  Hemostasis was obtained with  electrocautery from the proximal edges of all cut muscles.  Next, the  dorsal skin was folded over to cover the  amputation stump.  Before this,  some of the extensor musculature was sewn loosely to the volar  musculature to pad the ends of the radius and ulna.  A 19-French round  Blake drain was threaded through the incision and sutured to the skin  with a 2-0 nylon stitch.  Next, the skin was trimmed to fit the defect  and sewn with interrupted 2-0 nylon sutures.  Sponge and needle counts  were reported as correct.  Xeroform was applied to the amputation stump  as were 4x4s, ABD pads, Kerlix wrap, and Ace bandage.  The patient was  then transported, still intubated back to the ICU.      Loreta Ave, MD  Electronically Signed     CF/MEDQ  D:  09/26/2008  T:  09/27/2008  Job:  [id number]

## 2010-10-07 NOTE — H&P (Signed)
NAMEDASHLEY, [name]                 ACCOUNT NO.:  [id number]   MEDICAL RECORD NO.:  [id number]          PATIENT TYPE:  IPS   LOCATION:  4036                         FACILITY:  [facility]   PHYSICIAN:  Ranelle Oyster, M.D.DATE OF BIRTH:  [birth year]   DATE OF ADMISSION:  11/02/2008  DATE OF DISCHARGE:                              HISTORY & PHYSICAL   CHIEF COMPLAINT:  Weakness and left limb loss.   HISTORY OF PRESENT ILLNESS:  This is a 65 year old white female with a  history of uterine cancer and status post hysterectomy and issues to  chemo on September 03, 2008.  She fell on September 15, 2008, and suffered a  right ankle fracture.  She was admitted to [facility] and noted  to be thrombocytopenic with platelets at 10,000.  She developed an SVC  thrombus on September 16, 2008, and developed confusion.  CT of the chest  was negative for PE.  A 2-D echo was negative for PFO.  The head CT was  without acute changes.  Dr. Truett Perna was consulted and recommend  anticoagulant to treat central thrombosis and platelet transfusion  p.r.n.  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  Attempts to thrombolysis by  Interventional Radiology were unsuccessful.  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   The patient's Port-A-Cath was removed on September 21, 2008, secondary to  extensive deep venous thrombosis.  The patient developed VDRF and  required ventilation requiring tracheostomy.  She was extubated on Oct 05, 2008, and decannulated on October 25, 2008.  She underwent ORIF of her  right ankle on Oct 08, 2008, by Dr. Ranell Patrick and is nonweightbearing for  another 4 weeks.  Swallowing evaluation was done after extubation.  The  patient is on a regular diet.  Speech therapy reports improving  cognition.  She has profound  deconditioning in the setting of her other  wounds.  The patient was evaluated by the rehab service on October 29, 2008.  We felt that she could benefit ultimately from inpatient rehab stay and  she was brought today.   REVIEW OF SYSTEMS:  Notable for cough, reflux, weakness, wound issues,  and blurred vision.  She is having some phantom sensation in the left  arm.   PAST MEDICAL HISTORY:  Positive for:  1. Uterine cancer, status post TAH-BSO in January 2010.  2. Hypertension.  3. Migraine headaches.  4. Hypothyroid.  5. Esophageal dysphagia, status post dilatation.  6. Reflux disease.  7. T&A.  8. PAC placement on September 01, 2008.   FAMILY HISTORY:  Positive for cancer.   SOCIAL HISTORY:  The patient is married.  Lives in [place] in a one-level  house, 4 steps to enter.  The patient does not smoke or drink.  The  patient is a retired Engineer, site.   ALLERGIES:  1. DOXYCYCLINE.  2. HEPARIN, which induced thrombocytopenia.   HOME MEDICATIONS:  Cardia, Evista,  levothyroxine, imipramine, Topamax,  Prilosec, diclofenac, Melatonin, Os-Cal, and Zomig.   LABORATORIES:  Hemoglobin 9, white count 7.8, and platelets 398.  Sodium  139, potassium 3.5, BUN 8, and creatinine 0.59.   PHYSICAL EXAMINATION:  VITAL SIGNS:  Blood pressure is 137/76, pulse  108, respiratory rate 20, temperature 97.9.  GENERAL:  Pleasant and flat, in no acute distress.  She is alert and  oriented x3.  EAR, NOSE AND THROAT:  Notable for fair dentition.  Mucosa is pink and  moist.  NECK:  Notable for trach wound, which is clean and granulating with  minimal exposed granulation tissue.  CHEST:  Notable for a few rhonchi and decreased sounds on the right  base.  HEART:  Regular rate and rhythm without murmur, rubs, or gallops.  ABDOMEN:  Soft and nontender.  Bowel sounds are positive.  EXTREMITIES:  No clubbing or cyanosis, but 1+ edema on left lower  extremity.  SKIN:  Notable for multiple wounds.  The left above  elbow amputation was  clean and intact with minimal drainage.  The right index finger notable  for the amputation distal to the PIP.  That wound was healing with  sutures and granulating.  The patient had an area of healing eschar in  the thumb and middle finger.  The second left toe also has an area of a  black eschar, which was dry and intact.  The patient's sensation was  grossly intact, though she complained of some mild dysesthesias and fine  sensory loss in the affected fingers.  NEUROLOGIC:  Cranial nerves II through XII revealed decreased visual  acuity, otherwise this is fairly mild.  No diplopia was seen.  Reflexes  are 1+.  Sensation as noted above.  Judgment was fair as well as  orientation and memory.  Mood was very flat.  Strength was 3/5 in left  lower extremity, perhaps proximally then 4/5 distally.  Right lower  extremity is 3/5 proximal and toes move with 3/5 strength in plantar and  dorsiflexion.  It is difficult to assess ankle movement due to her cast.  Upper extremity strength on the right was 4/5.  Left upper extremity was  3/5 at the shoulder and good range of motion in all planes.  The patient  did have a right lower extremity short-leg cast, toes, and foot on that  extremity was neurovascularly intact.   POSTADMISSION PHYSICIAN EVALUATION:  1. 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  The patient      also with right ankle trimalleolar fracture requiring ORIF.  Course      complicated by VDRF and severe deconditioning.  2. The patient is admitted to receive collaborative interdisciplinary      care between the physiatrist, rehab nursing staff, and therapy      team.  3. The patient's level of medical complexity substantial therapy needs      in context of that medical necessity cannot be provided at a lesser      intensity of care.  4. The patient has experienced substantial  functional loss from her      baseline.  Premorbidly, she was independent.  As of the rehab      evaluation on October 29, 2008, the patient was max to total assist for      basic mobility and self-care.  Currently, she remains max-to-mod      for transfers.  Total  assist to stand x3 for 30 seconds.  She      supervision upper body care and mod assist lower body care.      Judging by the patient's diagnosis, physical exam and functional      history, she has  potentia for l functional progress, which will      result in measurable gains while in inpatient rehab.  These gains      will be of substantial and practical use upon discharge to home      with her husband in facilitating mobility, self-care, etc.  Interim      changes since our rehab consult are noted above.  5. Physiatrist will provide 24-hour management and medical needs as      well as oversight of the therapy plan/treatment and provide      guidance as appropriate regarding interaction of the 2.  Medical      problem list and plan are listed below.  6. The 24-hour rehab nursing will assist in the management of the      patient's wound care as well as pain management, nutrition,      medication administration, bowel and bladder function, and      integration of therapy concepts and techniques.  7. PT will assess and treat for lower extremity strength, range of      motion, functional mobility, gait, safety, adaptive equipment, and      techniques.  Goals are supervision to modified independent.  8. OT will assess and treat for upper extremity use, adaptive      techniques, family education, safety awareness with goals.      Supervision to modified independent.  9. Case Management and social worker will assess and treat for      psychosocial issues and discharge planning.  10.Team conferences will be held weekly to assess progress towards      goals and to determine barriers at discharge.  11.The patient has demonstrated  sufficient medical stability and      exercise capacity to tolerate at least 3 hours of therapy per day      at least 5 days per week.  12.Estimated length of stay is 2-3 weeks.   PROGNOSIS:  Fair to good.   MEDICAL PROBLEM LIST AND PLAN:  1. Uterine cancer.  The patient is for abdominal CT today.  Follow up      notes and determine further treatments.  2. Superior vena cava syndrome:  Coumadin.  INR 3.6 today.  Adjustment      per pharmacy.  Observe closely for signs and symptoms of bleeding      complications.  3. Cough:  Mucinex added today.  Encouraged incentive spirometry.      Flonase for nasal congestion.  Chest exam not consistent with an      extensive pulmonary process, but we will follow closely.  4. Anemia:  Iron supplement.  Check CBC serially.  5. Wound care:  Dry dressing Xeroform to left above elbow wound.      Continue other local care and pressure relief measures for now.      Encourage adequate nutrition.  6. Blurred vision:  We will follow for now.  May be repercussions of      the patient's hospital course and potentially medications.  Seems      to have stabilized.  May need outpatient Ophthalmology follow up.      Ranelle Oyster, M.D.  Electronically Signed  ZTS/MEDQ  D:  11/02/2008  T:  11/03/2008  Job:  [id number]

## 2010-10-07 NOTE — Consult Note (Signed)
[name], [name]                 ACCOUNT NO.:  [id number]   MEDICAL RECORD NO.:  [id number]          PATIENT TYPE:  INP   LOCATION:  2108                         FACILITY:  [facility]   PHYSICIAN:  Wilson Singer, M.D.DATE OF BIRTH:  May 01, 1946   DATE OF CONSULTATION:  09/25/2008  DATE OF DISCHARGE:                                 CONSULTATION   REFERRING PHYSICIAN:  Loreta Ave, MD, Orthopedics.   REASON FOR CONSULTATION:  To determine goals of care.   IMPRESSION:  1. Altered mental status - this is multifactorial, including sepsis,      superior vena cava syndrome, and left ischemic upper limb.  The      patient appears to also have a right ischemic hand.  2. Palliative performance score 30% at best.   RECOMMENDATIONS:  1. Continue full aggressive care for now including planned amputation      of left hand tomorrow.  2. The husband realizes that if the patient does not do well and the      patient has to have prolonged life support that he did not wish to      pursue this.   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  She was found to have a SVC syndrome likely from Port-A-  Cath that was placed a few weeks ago for chemotherapy.  The patient was  in ICU in [place] and then the patient was transferred here for further  management.  The patient since hospitalization has had medications to  dissolve the SVC thrombus but this has not been successful.  In the  meantime, she has developed compartment syndrome of the left hand and  has had fasciotomy done to this.  However, the left hand continues to be  ischemic and plans are for amputation  tomorrow based on the lack of  improvement.  It seems that the right hand now also is prone to  ischemia.  The patient did have thrombectomy and thrombolysis but this  has not helped matters.  Also, complicating features seems to be  thrombocytopenia most likely due to postchemotherapy but may be  resulting from sepsis also, and the patient has required several units  of platelet transfusions.  Prior to the series of events which have  taken place in the last few weeks, the patient apparently prior to  diagnosis approximately 2 months ago was fully functional.   Past medical history is significant for hypertension, clear cell uterine  cancer, endometrial cancer, hypothyroidism, and migraine headaches.   PAST SURGICAL HISTORY:  Hysterectomy, right salpingo-oophorectomy, and  total abdominal hysterectomy as mentioned above.   FAMILY HISTORY:  Noncontributory.   SOCIAL HISTORY:  There is no history of tobacco or alcohol abuse.   REVIEW OF SYSTEMS:  The patient is  on a ventilator, sedated, unable to  give any clear history.   PHYSICAL EXAMINATION:  The patient is intubated and mechanically  ventilated.  She appears to be hemodynamically stable.  She is afebrile.  She has cold, blue peripheries in the right hand and also the left hand.  Her feet also appeared to be cool and somewhat bluish discoloration.  Heart sounds are present and normal.  Lung fields appear to be clear.  Abdomen is soft and does not appear to be tender.  Neurological, she is  sedated and I cannot therefore evaluate neurological status.   RELEVANT DATA:  Most recent platelet count of 23, white blood cell count  18.3, hemoglobin 9.0.   DISCUSSION:  This unfortunate 65 year old lady appears to have limb-  threatening ischemia to her left upper limb and she has planned for  amputation of the left hand tomorrow.  The husband wishes to go ahead  with this and follow her closely but not to pursue further aggressive   measures if this would mean prolonging her life when she is not going to  be improving.  Husband does understand the decisions are going to be  based on a day-to-day evaluation of the patient's condition.  Time spent  was 75 minutes, more than 50% of which was involved in counseling and  coordination and discussion of pathophysiology of disease process and  the concept of palliative medicine.       Wilson Singer, M.D.  Electronically Signed     NCG/MEDQ  D:  09/25/2008  T:  09/26/2008  Job:  [id number]

## 2010-10-07 NOTE — Consult Note (Signed)
[name], [name]                 ACCOUNT NO.:  [id number]   MEDICAL RECORD NO.:  [id number]          PATIENT TYPE:  INP   LOCATION:  2108                         FACILITY:  [facility]   PHYSICIAN:  Leighton Roach. Truett Perna, M.D. DATE OF BIRTH:  02-17-1946   DATE OF CONSULTATION:  09/18/2008  DATE OF DISCHARGE:                                 CONSULTATION   REQUESTING PHYSICIAN:  Critical care medicine.   HISTORY OF PRESENT ILLNESS:  Ms. [name] is a 65 year old woman, patient  of Dr. Cleone Slim, who has been transferred from [facility] for further  evaluation of ankle fracture - SVC syndrome.  In review, the patient has  a history of clear cell uterine carcinoma, stage T1B, status post  adjuvant Taxol and carboplatin chemotherapy, with the first cycle on  September 03, 2008.  For well detailed information regarding the  chemotherapy, please refer to the attached consultation note by Dr.  Cleone Slim.  She was admitted on September 15, 2008, after a fall with resultant  ankle fracture, and her platelets were noticed to be 10,000.  She  received a transfusion of platelets, with a good response returning to  60,000 on September 17, 2008, but as of September 18, 2008, dropping to 40,000.  It is important to mention that on September 16, 2008, she was found to have  a thrombus in the right SVC.  She developed confusion and a non-contrast  CT was performed, which was negative for PE.  She is transferred to [facility], for the management of SVC syndrome.  We were asked to see her,  to help in the management of the hematological - oncological issues  while in the hospital.   PAST MEDICAL HISTORY:  1. History of clear cell uterine carcinoma, as above.  2. SVC thrombosis, during this admission.  3. Hypertension.   PAST SURGICAL HISTORY:  1. Status post total abdominal hysterectomy and bilateral salpingo-      oophorectomy on June 15, 2008.  2. Status post Port-A-Cath placement in April 2010.  3. Status post T and A as  a child.   REVIEW OF SYSTEMS:  Limited, the patient appears very confused.   ALLERGIES:  DOXYCYCLINE WHICH CAUSES JAUNDICE WITH NAUSEA AND VOMITING.   FAMILY HISTORY:  Significant for maternal grandmother, with breast  cancer at an elderly age, paternal aunt had bladder cancer.  Paternal  uncle had colon cancer in his 56s.  One brother died of brain cancer in  his 46s.   SOCIAL HISTORY:  The patient is married.  No alcohol or tobacco history.  She has a Energy manager degree from [employer].  She  has three children.  She is a retired Runner, broadcasting/film/video.   PHYSICAL EXAMINATION:  VITAL SIGNS:  Blood pressure 155/84, pulse 85,  respirations 20, temperature 98.7.  GENERAL:  The patient is a 65 year old white female, confused, follows  commands, and moves all extremities symmetrically.  HEENT:  Normocephalic, atraumatic, the patient has slightly less  reactive pupils, with right subconjunctival minimal hemorrhage.  NECK:  Supple.  No cervical or supraclavicular masses.  LUNGS:  Clear anteriorly.  CARDIOVASCULAR:  Regular rate and rhythm without murmurs, rubs or  gallops.  ABDOMEN:  Soft, nontender.  Bowel sounds x4.  EXTREMITIES:  Without leg edema, the right lower extremity has a visible  fracture in the ankle.  In addition, there is bilateral arm edema,  venous engorgement in both chest walls.   LABORATORY DATA:  From [facility] on September 17, 2008, shows a hemoglobin of  10.9, platelets of 41, and a white count of 15.3.  All the labs during  this admission are pending.   RADIOLOGICAL STUDIES:  1. CT of the head without contrast on September 17, 2008, is negative for      intracranial abnormality.  2. CT angio of the chest on September 16, 2008, shows extensive chest wall      collateral vessels on the right side, no contrast is seen in the      right subclavian vein or SVC.  Thrombosis - occlusion is likely.      No PE.  Bilateral pleural effusions and bibasilar atelectasis,       right greater than left are seen.   ASSESSMENT/PLAN:  1. 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  2. Superior vena cava thrombosis, Port-A-Cath related.  3. Thrombocytopenia secondary to chemotherapy.  4. Acute mental status changes, possibly related to superior vena cava      syndrome and polypharmacy.  5. Ankle fracture.   RECOMMENDATIONS:  1. Acute management of port related central thrombosis, per      interventional radiology.  2. Heparin and then Coumadin anticoagulation after IR procedure.  3. Check CBC and transfuse platelets for less than 20,000 count.  4. Evaluate acute mental status changes per critical care medicine,      consider neurology evaluation.   PLAN:  For anticoagulation to treat the central symptomatic thrombosis.  Give platelet transfusion support as needed.  Hopefully, her platelet  count will recover over the next few days.  We will follow with the  critical care medicine service.  Thank you very much for allowing [place] the  opportunity to participate in the care of this nice patient.      Marlowe Kays, P.A.      Leighton Roach. Truett Perna, M.D.  Electronically Signed    SW/MEDQ  D:  09/19/2008  T:  09/19/2008  Job:  [id number]   cc:   Lynett Fish, M.D.

## 2010-10-07 NOTE — Op Note (Signed)
NAMEYUSRA, RAVERT                 ACCOUNT NO.:  [id number]   MEDICAL RECORD NO.:  [id number]           PATIENT TYPE:   LOCATION:                                 FACILITY:   PHYSICIAN:  Karol T. Lazarus Salines, M.D. DATE OF BIRTH:  08/07/1945   DATE OF PROCEDURE:  10/05/2008  DATE OF DISCHARGE:                               OPERATIVE REPORT   PREOPERATIVE DIAGNOSIS:  Prolonged intubation.   POSTOPERATIVE DIAGNOSIS:  Prolonged intubation.   PROCEDURE PERFORMED:  Tracheostomy, NG placement.   SURGEON:  Gloris Manchester. 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   BLOOD LOSS:  Minimal.   COMPLICATIONS:  None.   FINDINGS:  A fatty lower neck with a relatively small caliber trachea.  Tracheostomy tube placed just below the thyroid isthmus.  A hand  nasogastric feeding tube placed in the right nostril without difficulty  with auscultation of air into the stomach confirmed.   PROCEDURE:  With the patient in a comfortable supine position, general  anesthesia was administered per indwelling orotracheal tube.  At an  appropriate level, the patient was placed in a slight reverse  Trendelenburg.  A shoulder roll was placed.  The neck was extended and  the head supported.  The lower neck was palpated with the findings as  described above.  Xylocaine 1% with 1:100,000 epinephrine, 10 mL total  was infiltrated into the surgical field for intraoperative hemostasis.  Several minutes were allowed for this to take effect.  A sterile  preparation and draping in low neck was accomplished.   Again, the anatomy was palpated and findings as above.  A 3-cm  transverse incision was made approximately 2 cm above the sternal notch  with the cutting and coagulating cautery and carried down through the  skin, subcutaneous fat, and the superficial layer of the deep cervical  fascia.  A small anterior jugular branch was controlled with 2-0 silk  ligature.  In the deeper layers,  the strap muscles were divided, the  midline was retracted laterally.  There was fatty tissue in the  pretracheal area and upon further dissection, the thyroid isthmus was  observed to be medially above the area of proposed tracheal injury.  A  small venous vessels at the isthmus inferiorly were controlled with  cautery.  The anterior face of the trachea was cleaned bluntly.  The  involvement was probably in the second and third interspace.  A  transverse incision was made into the tracheal lumen and a 5 mm wide  inferiorly-based cartilaginous flap was generated and secured to the  lower wound with a 2-0 chromic stitch.  Mucosal edges were cauterized  with ventilation on hold.  There was a moderate quantity of tanned  secretions in the trachea.   A #6-fit cuffed Shiley tracheostomy tube had been previously prepared  and was brought into the field.  The orotracheal tube was gently backed  out and the tracheostomy tube was placed after first opening of the  lumen with a small trache spreader.  It was placed without difficulty.  Ventilation was  assumed per tracheostomy tube and carbon dioxide return  was noted.  The cuff was inflated and observed to be intact and  containing air.  A trache dressing was applied, and the trache straps  were secured in the standard fashion.  Hemostasis was observed.  Ventilation was adequate.   The orogastric tube was taped to the ET tube and upon removing the ET  tube, this was removed as well.  We had previously checked with the ICU  nurses regarding the need for stomach access and strictly for feeding.  Therefore, at this point, a 10-French Panda nasogastric feeding tube was  placed through the right nostril and into the stomach.  A bolus of air  was ausculted over the stomach and confirmed placement.  The tube was  secured in the standard fashion.  At this point, the procedure was  completed and the patient was returned to the Anesthesia, awakened and   transferred back to the 2100 intensive care unit in stable condition.   Comment:  A 65 year old white female with uterine cancer and a host of  complications.  This prolonged ventilator dependence was indication for  today's procedure.  Anticipate a routine postoperative recovery with  routine trache care.      Gloris Manchester. Lazarus Salines, M.D.  Electronically Signed     KTW/MEDQ  D:  10/05/2008  T:  10/05/2008  Job:  [id number]

## 2010-10-07 NOTE — Consult Note (Signed)
[name], GANDOLFI                 ACCOUNT NO.:  [id number]   MEDICAL RECORD NO.:  [id number]          PATIENT TYPE:  INP   LOCATION:  2108                         FACILITY:  [facility]   PHYSICIAN:  Zola Button T. Lazarus Salines, M.D. DATE OF BIRTH:  1945-09-26   DATE OF CONSULTATION:  10/02/2008  DATE OF DISCHARGE:                                 CONSULTATION   CHIEF COMPLAINT:  Prolonged intubation.   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  She had an ankle fracture.  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  She was intubated on September 21, 2008,  extubated on Sep 28, 2008, and reintubated approximately 1 hour later  with difficulty secondary to continued respiratory distress.  She  remains intubated with anticipation of a prolonged respiratory  recovery/weaning course.   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  She seems to respond to my conversation but does not  respond back.  The anatomy of the lower neck is basically normal.   IMPRESSION:  Prolonged intubation with anticipated slow wean/continued  ventilator support requirements.   PLAN:  I discussed this case with Dr. Molli Knock with special reference to  her anticoagulation status.  We will plan for tracheostomy later this  week and we will hold her anticoagulants 1 hour before surgery.  I will  discuss with the family as desired.      Gloris Manchester. Lazarus Salines, M.D.  Electronically Signed     KTW/MEDQ  D:  10/02/2008  T:  10/03/2008  Job:  [id number]   cc:   Felipa Evener, MD  Loreta Ave, MD  Lynett Fish, M.D.

## 2010-10-07 NOTE — H&P (Signed)
[name], [name]                 ACCOUNT NO.:  [id number]   MEDICAL RECORD NO.:  [id number]          PATIENT TYPE:  AMB   LOCATION:  DAY                           FACILITY:  [facility]   PHYSICIAN:  R. Roetta Sessions, M.D. DATE OF BIRTH:  12/23/1945   DATE OF ADMISSION:  DATE OF DISCHARGE:  LH                              HISTORY & PHYSICAL   REASON FOR CONSULTATION:  Dysphagia.   HISTORY OF PRESENT ILLNESS:  Ms. [name] is a pleasant 65 year old  Caucasian female sent over courtesy Dr. Sherril Croon of [facility] to further evaluate  a 2-3-year history esophageal dysphagia.  She has had intermittent  sensation that things meat would hang up behind her breast bone.  It is  a transient sensation until recently.  Approximately a week she  swallowed a piece of steak and felt it lodge behind her breast bone and  thought she was choking.  Multiple bystanders performed the Heimlich  maneuver.  She went to the emergency room and was checked out.  She has  not had a barium study or an upper endoscopy.  She does have heartburn a  couple of times weekly.  She is not on any acid suppression therapy.  There is no history of tobacco or alcohol use.  There is no family  history of GI neoplasia.  Her symptoms of dysphagia may have insidiously  worsened recently.  She has not lost any weight.  [name] denies nausea, vomiting, odynophagia, or early satiety.   PAST MEDICAL HISTORY:  Significant for hypertension, migraine headaches.   PAST SURGERIES:  Tonsillectomy.  She tells me she had a negative  colonoscopy by Dr. __________  over at [facility] in 2007.   CURRENT MEDICATIONS:  Evista 60 mg daily, Synthroid 0.25 mg daily,  cardia XT 240 mg daily, Topamax 200 mg daily, imipramine 50 mg daily,  Zomig 5 mg daily, calcium and fish oil supplements.   ALLERGIES:  DOXYCYCLINE   FAMILY HISTORY:  Mother is alive with hypertension.  Father died age 65  with dementia.  He had some vague swallowing difficulties as  well.   SOCIAL HISTORY:  Patient is married.  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  She has three children, one of  which has diabetes.  One has asthma.  No tobacco, no alcohol.   REVIEW OF SYSTEMS:  No chest pain, no dyspnea on exertion.  No change in  weight.  No fever, chills.  No melena, rectal bleeding.   PHYSICAL EXAMINATION:  Pleasant 65 year old lady accompanied by her  husband, weight 145, height 5 feet 4 inches, temperature 98.4,  BP  138/82, pulse 80.  SKIN:  Warm and dry.  There is no jaundice or any stigmata of chronic  liver disease.  HEENT:  No scleral icterus.  Conjunctivae are pink.  Oral cavity no  lesions.  She has her natural teeth and dentition is in good state of  repair.  There is no cervical adenopathy.  CHEST:  Lungs clear to  auscultation.  CARDIAC:  Regular rate and rhythm  without murmur, gallop or rub.  ABDOMEN: Nondistended, positive bowel sounds, entirely soft, nontender  without appreciable mass or organomegaly.  EXTREMITIES:  Reveals no lower extremity edema.   ASSESSMENT:  Ms. Hanako Weaver is a pleasant 65 year old lady with chronic  esophageal dysphagia to solids.  She describes a recent esophageal food  impaction.  She has background symptoms of gastroesophageal reflux  disease.  These symptoms demand further evaluation.  I talked to Mr. and  Mrs. Dena about the pros and cons of getting a barium pilo esophagram  versus going directly to EGD.  After some discussion we mutually agreed  to proceed with EGD with probable esophageal dilation as appropriate.  The technical aspects of this procedure along with the potential risks,  benefits, and alternatives have been fully reviewed.  All questions were  answered, all parties were agreeable.  Will plan to perform EGD with  esophageal dilation as appropriate in the near future at [facility] and make further recommendations at that time.  I suspect this  lady either  has a Schatzki's ring or less likely a peptic stricture.  A  neoplastic process would be highly unlikely.  I would like to thank Dr. Sherril Croon for  his continued confidence in me.      Jonathon Bellows, M.D.  Electronically Signed     RMR/MEDQ  D:  11/09/2006  T:  11/09/2006  Job:  [id number]   cc:   Doreen Beam  Fax: [phone]

## 2010-10-07 NOTE — Op Note (Signed)
[name], [name]                 ACCOUNT NO.:  [id number]   MEDICAL RECORD NO.:  [id number]           PATIENT TYPE:   LOCATION:                                 FACILITY:   PHYSICIAN:  Almedia Balls. Ranell Patrick, M.D. DATE OF BIRTH:  05-30-1945   DATE OF PROCEDURE:  10/08/2008  DATE OF DISCHARGE:                               OPERATIVE REPORT   PREOPERATIVE DIAGNOSIS:  Right displaced bimalleolar ankle fracture.   POSTOPERATIVE DIAGNOSIS:  Right displaced bimalleolar ankle fracture.   PROCEDURE PERFORMED:  ORIF right ankle, bimalleolar fracture.   SURGEON:  Almedia Balls. Ranell Patrick, MD   ASSISTANT:  Donnie Coffin. Dixon, PA-C.   ANESTHESIA:  General anesthesia was used.   ESTIMATED BLOOD LOSS:  Minimal.   TOURNIQUET TIME:  1 hour.   FLUID REPLACEMENT:  500 mL Crystalloid.   URINE OUTPUT:  150 mL.   INDICATIONS:  The patient is a 65 year old female who is critically ill  with respiratory failure status post trach placement.  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  She is to manage  conservatively due to her being too ill to have surgery.  I discussed  this with her family and with the patient.  They were definitely  interested in having her ankle fixed and not leaving it in a displaced  manner.  It had been reduced a couple of times, the most recent of which  placing the talus under the tibia but still is displaced in the near  malleolus.  Due to concerns of her potential for nonunion versus  malunion, she has brought to surgery today.  Informed consent having  been obtained.   DESCRIPTION OF SURGERY:  After adequate level of anesthesia achieved,  the patient was placed in supine on the operating table, bump on the  right hip.  Right leg was sterilely  prepped and draped in usual manner.  Exsanguination of the limb using an Esmarch bandage.  Elevation of the  tourniquet to 275 mmHg.  Longitudinal lateral skin incision made over  the distal fibula.  Dissection sharpened down to the fibula.  Subperiosteal dissection performed.  The patient's fracture has not  healed at all.  Granulation tissue presented in the fracture site  cleaned out.  We cleaned all the way up on to the anterior joint,  flushed the joint out.  Using the rongeur, removed excess soft tissue at  the joint.  We then made a hockey stick incision over the medial  malleolus.  Dissection down sharply being careful about the saphenous  vein which is preserved, retracting that anteriorly within, were able to  identify the fracture site, get all the soft tissue out of the fracture  itself and also cleaning up joint, flushing the joint very well.  Inspected the talus, which looked to be in good condition.  Within the  anatomically reduced medial malleolus,  placing initially of 1.6 K wire,  then placing two 4.0 malleolar partially threaded cancellous screws.  These are 45-mm screws across the fractured site gaining good purchase.  Following that we reduced the lateral malleolus.  It looked to me that  the antiglide plate work best for this and hold in good position and  placed a 5-hole one third tubular antiglide plate at the fracture site,  clamped that and placed two bicortical screws holding the antiglide  plate in place.  This anatomically held the reduction, and we checked  the ankle mortise on x-ray AP and lateral views and had it anatomically  reduced.  We fairly irrigated and closed the wounds with layer closure  with Vicryl and staples.  Sterile compressive bandage and short-leg  splint were applied.  The patient tolerated the surgery well.      Almedia Balls. Ranell Patrick, M.D.  Electronically Signed     SRN/MEDQ  D:  10/08/2008  T:  10/09/2008  Job:  [id number]

## 2010-10-07 NOTE — Discharge Summary (Signed)
[name], [name]                 ACCOUNT NO.:  [id number]   MEDICAL RECORD NO.:  [id number]          PATIENT TYPE:  IPS   LOCATION:  4036                         FACILITY:  [facility]   PHYSICIAN:  Erick Colace, M.D.DATE OF BIRTH:  08/07/1945   DATE OF ADMISSION:  11/02/2008  DATE OF DISCHARGE:  11/10/2008                               DISCHARGE SUMMARY   DISCHARGE DIAGNOSES:  1. Deconditioning secondary toVent dependant respiratory failure with      prolonged hospital course secondary to superior vena cava syndrome,      left upper extremity amputation, right ankle trimalleolar fracture.  2. Uterine cancer.  3. Thrombocytopenia, resolved.  4. Tachycardia, improving.  5. Urinary tract infection treated.  6. Acute blood loss anemia.   HISTORY OF PRESENT ILLNESS:  [name] is a 65 year old female with  history of uterine cancer status post hysterectomy and initiation of  chemo on 04/12.  The patient sustained a fall on 04/24 with right ankle  fracture.  She was admitted to [facility], noted to be  thrombocytopenic with platelets at 10,000.  She also developed SVT  thrombus on 04/25 as well as some confusion.  Chest CT was negative for  PE.  A 2-D echo was negative for PFO.  CT of head showed no acute  changes.  The patient was transferred to [facility] for further  treatment.  Dr. Truett Perna was consulted for input and recommended  anticoagulation to treat central thrombus as well as platelet  transfusion p.r.n. for thrombocytopenia.  Neuro was consulted for input  and felt patient with ongoing mental status changes secondary to  multiple medical issues and question paraneoplastic syndrome.  Attempts  at thrombolysis of central SVC thrombosis was unsuccessful.  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  Port-A-Cath was removed on  04/30 due to extensive  deep vein thrombosis.  She was noted to develop DIC with embolization to  toes on left foot as well as tip of right third finger.  She was trached  on 05/14 due to prolonged ventilation, extubated without difficulty and  was decannulated on 06/03.  The patient underwent ORIF right ankle 05/17  by Dr. Ranell Patrick.  The patient is nonweightbearing with recommendations for  continued nonweightbearing status approximately four additional weeks.  A swallow eval done past extubation showed no evidence of dysphasia.  The patient's cognitive status has improved.  She has profound  deconditioning in setting of other wounds.  She was evaluated by rehab  and he felt that she would benefit from CIR stay.   PAST MEDICAL HISTORY:  Uterine cancer status post TAH-BSO on January 10,  hypertension, migraine headaches, hypothyroid, esophageal dysphagia  status post dilatation, GERD, T&A, Port-A-Cath placement on April 10.   ALLERGIES:  DOXYCYCLINE and HEPARIN.   REVIEW OF SYMPTOMS:  Cough, reflux, weakness, blurred vision, as well as  phantom sensation left arm.   FAMILY HISTORY:  Positive for cancer.   SOCIAL HISTORY:  The patient is  married, lives in [place] in 1-level home  with 4 steps at entry.  Does not use any tobacco or alcohol.  He is a  retired Engineer, site.   FUNCTIONAL HISTORY:  The patient was independent prior to admission.   FUNCTIONAL STATUS:  The patient is total assist 65% stand  x 3 for 30-45  seconds, max-to-mod assist for transfers, requires supervision for upper  body care, mod assist for lower body care.   PHYSICAL EXAM:  VITAL SIGNS:  Blood pressure 136/76, pulse 108,  respiratory rate 20, temperature 97.9.  GENERAL:  The patient is alert, oriented, well-nourished, well-developed  female, flat affect.  HEENT:  Notable for a trach wound which is clean and granulating with  minimal exposed granulation tissue.  LUNGS:  Rhonchi with some decreased sounds  right base, congestive cough  noted.  HEART:  Regular rate, rhythm without murmurs, gallops or rubs.  ABDOMEN:  Soft, nontender with positive bowel sounds.  EXTREMITIES:  No evidence of clubbing or cyanosis.  Edema 1+ on left  upper extremity.  Right lower extremity has short leg cast in place.  Toes neurovascularly intact.  Right index finger notable for amputation  distal to PIP.  Wound healing with sutures in place and granulating.  SKIN:  Notable for multiple wounds left above elbow amputation site  clean, intact with minimal drainage.  Sutures in place.  The patient has  a healing eschar on thumb and middle finger of right hand.  The left  second toe and third toe have areas of black eschar which are dry and  intact.  NEUROLOGIC:  Cranial nerves II-XII reveal decreased visual acuity,  otherwise this is fairly mild.  No diplopia seen.  Reflexes 1+.  Sensation grossly intact although the patient has some dysesthesias and  fine motor sensory loss in affected fingers.  Judgment and orientation  intact.  Mood is very flat.  Strength is 3/5 left lower extremity  perhaps proximally and 4/5 distally.  Right lower extremity is 3/5  proximally.  Toes move with 3/5 strength in plantar and dorsi flexion.  It is difficult to assess ankle movement due to cast.  Upper extremity  strength on right was 4/5.  Left upper extremity strength was 3/5 at  shoulder.  Good range of motion in all planes.   HOSPITAL COURSE:  Ms. [name] was admitted to rehab on November 02, 2008, for inpatient therapies to consist of PT/OT at least 3 hours 5  days a week.  Past admission; physiatrist, rehab RN, and therapy team  have worked together to provide customized collaborative  interdisciplinary care.  Rehab RN has been working with the patient on  wound care as well as setting up a bowel and bladder program and  monitoring the patient's p.o. intake to maintain nutritional status.  Labs were done past admission  revealing sodium 135, potassium 3.5,  chloride 108, CO2 20, BUN 17, creatinine 0.63, glucose 110.  LFTs showed  mild elevation in ALT 38, AST 27 total protein 5.8, albumin 2.5.  The  patient was noted to have mild hypokalemia prior to admission and she  was started on potassium supplements 10 mEq p.o. per day and remains on  this at time of discharge.  CBC past admission revealed hemoglobin 8.8,  hematocrit 25.8, white count 6.2, platelets 348.  The patient continues  on iron supplements b.i.d. basis for now.  Recheck CBC of June 17 shows  H&H improving at 9.9 and 29.0.  Check  of lytes of June 17 revealed  sodium 135, potassium 3.9, chloride 107, CO2 19, BUN 15, creatinine  0.69.  A UA/UC done past admission; urine culture showed 30,000 colonies  multi species.  Pharmacy has been following the patient for monitoring  of PT and INR.  They have been checking chromogenic factor as well as  PTT due to __________ effect.  Dr. Truett Perna has also been following for  input.  He recommends goal INR at 2.5 to 3, __________effect gone  keeping INR at approximately 3.5, while PTT is still elevated.  The last  chromogenic Xa factor of June 15 is at 29.4.  PT of June 18 reveals PT  at 33.1, INR at 3.0.  PTT of June 18 is at 31.  The patient's blood  pressures have been monitored on b.i.d. basis.  These have been labile.  She was also noted to have issues with tachycardia with heart rate from  100-120 range.  Dr. Delford Field recommended resuming the patient's Cardizem  at lower dose.  Cardizem was resumed at 60 mg p.o. q.8 h. on June 17.  Blood pressures currently ranging at 120-140 systolic, 70s to 16X  diastolic.  Heart rate is at 100 to 114 range currently.  Anticipate the  patient's heart rate, tachycardia improving with improved endurance  level and with Cardizem resumed.  The patient has had issues with sleep-  wake disruption during this stay.  She has reported issues with restless  legs syndrome.  She  was tried on high doses of Seroquel without much  improvement in her sleep cycle.  Her Seroquel was discontinued and  imipramine was resumed at 50 mg p.o. q.h.s. additionally ReQuip was  increased to 1 mg p.o. q.h.s. with improvement in her restless legs  symptoms.  Seroquel was added back to 25 mg p.o. q.h.s. with improvement  in sleep cycle.  The patient advised to increase imipramine to her home  dose slowly, if she feels she requires additional doses past discharge.  The patient's left above elbow amputation site has been monitored along.  Sutures remained in place until 06/18.  Sutures were removed by Dr.  Noelle Penner at that time on 06/18 and she is noted to have some increase in  serosanguineous drainage and dressing changes increased to b.i.d. basis.  The patient to follow up with Dr. Noelle Penner for recheck in 6 weeks.   The patient's pain control is reasonable with p.r.n. Tylenol use.  She  was noted to have cough at time of admission and Mucinex DM was added on  b.i.d. basis.  Additionally Flonase was added to help with postnasal  drip, and this has been somewhat effective.  During the patient's stay  in rehab, team conference was held to monitor the patient's progress,  set goals as well as discuss barriers to discharge.  The patient has  been undergoing therapy at least 3 hours 5 days a week.  At time of  admission, the patient was noted to be profoundly weak with increased  sensitivity left upper extremity and was limited by being allowed  mobility only in right upper extremity and left lower extremity.  She  was noted to have decrease in strength, decreased range of motion and  coordination on right upper extremity as well as decreased strength in  right lower extremity.  OT eval revealed the patient had total assist to  stand at sink for pedicare.  OT has been working with the patient with  ADL training at edge  of bed with focus on activity tolerance as well as  using DME for  self-care.  The patient is overall at set up for ADL tasks  with lateral leans and rolling for lower body dressing.  She is min  assist for bed to bedside commode transfers.  Family education was done  with husband to include basic self-care tasks at bed level as well as  practice button hooking and practicing toileting transfers with spouse  to include toileting.  The patient and spouse prefer performing standing  for lower body dressing and have demonstrated safety with this.  PT  evaluation revealed the patient had mod assist for bed mobility, max  assist for squat pivot transfers, the patient was able to stand in  parallel bars pulling up total assist 20% with increased fatigue in p.m.  She was able to propel her wheelchair with right upper extremity and  left lower extremity 75 feet x1 with min assist.  Physical therapy has  been working with the patient on endurance as well as lower extremity  therapeutic exercise group for bilateral lower extremity strengthening  to help improve functional mobility.  The patient is currently at  modified independent for wheelchair to bed sliding board transfer.  She  requires mod assist for sit to stand with nonweightbearing on the right  lower extremity to improve strength.  The patient is currently able to  maintain nonweightbearing status on right lower extremity without cues.  Family education was done with husband to include wheelchair management,  lateral scoot transfers, sliding board transfers, car transfers.  The  patient is currently at supervision level overall and husband to provide  assist past discharge.  The patient will also continue with followup  home health PT, OT by [facility].  Home Health RN has been  arranged for wound monitoring of left amputation site as well as protime  draws.  Next PT/INR and PTT to be drawn on Monday with results to Dr.  Bertha Stakes office.  On 11/10/2008, the patient is discharged to home.    DISCHARGE MEDICATIONS:  1. Ferrous sulfate 325 mg b.i.d.  2. Imipramine 50 mg q.h.s., can increase by 50 mg every 5 days to home      dose of 150 mg a day.  3. Synthroid 75 mcg a day.  4. Flonase two squirts in nostril at bedtime.  5. Coumadin dose to be decided on a.m. of discharge.  6. Mucinex DM one p.o. b.i.d.  7. Requip 1 mg p.o. q.h.s.  8. K-Dur 20 mEq a day.  9. Multivitamin one per day.  10.Seroquel 25 mg q.h.s.  11.Cardizem 60 mg q.8 h.  12.FiberCon 2 pills at bedtime.  13.Ultram 50 mg q.4-6 hours p.r.n. moderate to severe pain.  14.Prilosec 20 mg a day.   DIET:  Regular.   Wound care dry dressing to left amputation site b.i.d. basis until  drainage resolved.   SPECIAL INSTRUCTIONS:  Do not use Cartia XT, diclofenac, Topamax, or  Evista.  Dr. Cleone Slim to monitor and adjust Coumadin with current goals at 3-  3.5 until PTT normalizes.  Activity level 24-hour supervision.  No  strenuous activity.  No alcohol, no smoking, no driving.   FOLLOWUP:  The patient to follow up with Dr. Rosine Abe as needed.  Follow up with Dr. Ranell Patrick for postop check in 1 week, and then for input  regarding cast, and for the x-rays follow up with Dr. Noelle Penner in six  weeks.  Follow up with  Dr. Cleone Slim in 1 week.  Follow up with Dr. Sherril Croon in  approximately 2 weeks for routine check.      Greg Cutter, P.A.      Erick Colace, M.D.  Electronically Signed    PP/MEDQ  D:  11/09/2008  T:  11/10/2008  Job:  [id number]   cc:   Lynett Fish, M.D.  Loreta Ave, MD  Almedia Balls. Ranell Patrick, M.D.  Leighton Roach. Truett Perna, M.D.

## 2010-10-07 NOTE — Op Note (Signed)
[name], [name]                 ACCOUNT NO.:  [id number]   MEDICAL RECORD NO.:  [id number]          PATIENT TYPE:  AMB   LOCATION:  DAY                           FACILITY:  [facility]   PHYSICIAN:  R. Roetta Sessions, M.D. DATE OF BIRTH:  Jan 10, 1946   DATE OF PROCEDURE:  11/10/2006  DATE OF DISCHARGE:                               OPERATIVE REPORT   INDICATIONS FOR PROCEDURE:  65 year old lady referred by Dr. Sherril Croon to  evaluate esophageal dysphagia.  EGD with possible esophageal dilation  now being done.  This approach has discussed the patient at length.  Potential risks, benefits and alternatives have been reviewed, questions  answered.  She is agreeable.  Please see documentation in the medical  record.   PROCEDURE NOTE:  O2 saturation, blood pressure, pulse, respiration  monitored throughout entire procedure.   CONSCIOUS SEDATION:  Demerol 75 mg IV and Versed 5 mg IV in divided  doses.  Cetacaine spray for topical pharyngeal anesthesia.   INSTRUMENTATION:  Pentax video chip system.   FINDINGS:  Examination tubular esophagus revealed a Schatzki's ring.  The esophageal mucosa otherwise appeared normal.  EG junction was easily  traversed.  Stomach:  Gastric cavity was empty, insufflated well with air.  Thorough  examination gastric mucosa including retroflex view proximal stomach  esophagogastric junction demonstrated only a small hiatal hernia and the  ring.  Pylorus patent, easily traversed.  Examination the bulb, second  portion revealed no abnormalities.   THERAPEUTIC/DIAGNOSTIC MANEUVERS:  Scope was withdrawn.  A 54-French  Maloney dilator was passed full insertion with ease, look back revealed  only a slight disruption of the ring with this maneuver.  I pulled the  scope bowel and advanced a 56-French Maloney dilator to full insertion  with ease, look back revealed an very nice disruption the ring without  apparent complication.  The patient tolerated the procedure well, was  reacted in endoscopy.   IMPRESSION:  Schatzki's ring status post dilation/disruption as  described above otherwise normal esophagus, small hiatal hernia,  otherwise normal stomach, D1, D2.   RECOMMENDATIONS:  The patient should be on a PPI as rings are associated  with gastroesophageal reflux disease.  Have recommended she try Prilosec  20 grams OTC daily.  She may or may not need a repeat dilation in the  future.  Recurrent symptoms would dictate that approach.   She is to follow up with Dr. Sherril Croon as recommended.      Jonathon Bellows, M.D.  Electronically Signed     RMR/MEDQ  D:  11/10/2006  T:  11/10/2006  Job:  [id number]   cc:   Doreen Beam  Fax: [phone]

## 2010-10-07 NOTE — Op Note (Signed)
[name], [name]                 ACCOUNT NO.:  [id number]   MEDICAL RECORD NO.:  [id number]          PATIENT TYPE:  INP   LOCATION:  2108                         FACILITY:  [facility]   PHYSICIAN:  Janetta Hora. Fields, MD  DATE OF BIRTH:  17-May-1946   DATE OF PROCEDURE:  09/21/2008  DATE OF DISCHARGE:                               OPERATIVE REPORT   PROCEDURE:  Removal of Port-A-Cath.   PREOPERATIVE DIAGNOSIS:  Extensive deep vein thrombosis secondary to  right subclavian vein Port-A-Cath.   POSTOPERATIVE DIAGNOSIS:  Extensive deep vein thrombosis secondary to  right subclavian vein Port-A-Cath.   ANESTHESIA:  General.   ASSISTANT:  Nurse   OPERATIVE FINDINGS:  Port-A-Cath removed from right subclavian vein  intact.   OPERATIVE DETAILS:  After obtaining informed consent from the patient's  husband, the patient was taken to the operating room.  The patient was  placed in the supine position on the operating room table.  After  induction of general anesthesia via endotracheal intubation, the  patient's entire right neck and chest were prepped and draped in the  usual sterile fashion.  Transverse incision was made through a pre-  existing scar in the right anterior chest wall.  Incision was carried  down through the subcutaneous tissues down to level of the Port-A-Cath.  Port-A-Cath was mobilized and all the sutures to the fascia were  removed.  The Port-A-Cath and its tip were then removed in one piece and  direct pressure was held over the insertion site.  After approximately 5  minutes of pressure, this was removed and there was good hemostasis.  The subcutaneous tissues were reapproximated using running 3-0 Vicryl  suture.  The skin was closed with a 4-0 Vicryl subcuticular stitch.  The  patient tolerated the procedure well and there were no complications.  Instrument, sponge, and needle counts were correct at the end of the  case.  The patient remained in the operating room as  Orthopedics was  applying a cast to her right leg and doing fasciotomies of her left arm  simultaneously.  This operative notes were dictated separately.      Janetta Hora. Fields, MD  Electronically Signed     CEF/MEDQ  D:  09/21/2008  T:  09/22/2008  Job:  [id number]

## 2010-10-07 NOTE — Discharge Summary (Signed)
[name], [name]                 ACCOUNT NO.:  [id number]   MEDICAL RECORD NO.:  [id number]          PATIENT TYPE:  INP   LOCATION:  5506                         FACILITY:  [facility]   PHYSICIAN:  Charlcie Cradle. Delford Field, MD, FCCPDATE OF BIRTH:  1946/03/19   DATE OF ADMISSION:  09/18/2008  DATE OF DISCHARGE:                               DISCHARGE SUMMARY   DISCHARGE DIAGNOSES:  1. Acute respiratory failure.  2. Arterial thromboembolism.  3. Peripheral vascular ischemia.  4. Anemia.  5. Heparin induced thrombocytopenia.  6. Hypokalemia.  7. Uterine cancer.  8. Debilitation.   HISTORY OF PRESENT ILLNESS:  Briefly, [name] is a 65 year old female  with a recent diagnosis of endometrial cancer, status post total  hysterectomy.  She has had one round of chemotherapy.  A few weeks prior  to admission she had a Port-A-Cath placed for chemotherapy  administration.  The patient suffered a fall on September 15, 2008, with a  right ankle fracture and was admitted to [facility] for a closed  reduction.  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  On admission, her initial HIT screen was  negative, but second HIT assay was positive, indicating that the patient  had developed heparin-induced thrombocytopenia.   RADIOLOGY DATA:  Most recent radiology data; portable chest x-ray from  October 25, 2008, showed bilateral lower lobe airspace opacities, normal  heart size and stable effusion.  Some older radiology data on admission,  September 18, 2008, chest x-ray shows bilateral pleural effusions and  bibasilar airspace disease, right greater than left.  Right ankle x-ray  showed trimalleolar fracture with lateral displacement and angulation of  the distal fracture fragment.  A CT head with contrast on September 19, 2008, showed mild atrophy, but no acute intracranial abnormality.  On  September 19, 2008, a venogram shows extensive upper extremity and central  venous thrombosis.  There was a thrombus involving bilateral upper  extremities and the internal jugular vein.  Bilateral upper extremity  thrombolytic catheters were placed and __________was started through  each catheter.   LABORATORY DATA:  On admission on September 18, 2008, complete metabolic  panel; sodium 129, potassium 3.5, glucose 117, BUN 11, creatinine 0.68,  magnesium was 2.0, phosphorus 3.5, PT 14.4, INR 1.1, PTT 33.  CBC -  white blood cells 20.4, hemoglobin 10.5, hematocrit 30.3, platelets were  found to be clumped.  TSH from September 19, 2008, was 6.070.  HIT screen on  September 20, 2008, was negative.  However, a second heparin antibody screen  on Oct 16, 2008, was positive.  Most recent laboratory data from November 02, 2008, CBC - white blood cells 7.8, hemoglobin 9.0, hematocrit 26.5,  platelets 398, PTT 171, PT 38.8, INR 3.6.  Basic metabolic panel; sodium  139, potassium 3.5, glucose 100, BUN 8, creatinine 0.59.  Chromogenic  factor was 37.5.   PROCEDURES:  Please see multiple  operative reports from this admission.  On September 21, 2008, Dr. Blanch Media took the patient to the OR for  fasciotomy for compartment syndrome of the left hand and SVC syndrome.  On the same day in the OR, the Port-A-Cath was removed.  On Sep 26, 2008, the patient was taken to OR by Dr. Blanch Media for a left forearm  amputation.  The patient went to the OR on Oct 05, 2008,  with Dr. Zola Button  [facility], M.D. for a tracheostomy placement.  The patient was taken to  the OR on Oct 08, 2008, by Dr. Malon Kindle for ORIF of the right ankle  bimalleolar fracture.  The patient was taken back to the OR on Oct 17, 2008, for amputation of the right index fingertip and further amputation  of the previous left forearm amputation related to loss of previous  forearm  amputation skin.   HOSPITAL COURSE BY DISCHARGE DIAGNOSES:  1. Acute respiratory failure.  The patient did develop acute      respiratory failure in the immediately postop setting after      fasciotomy on September 21, 2008.  This was likely a combination of      fluid overload, over sedation and agitation.  The patient did      require intubation on September 21, 2008, and remained intubated from      September 21, 2008 to Oct 05, 2008 with placement of tracheostomy by      Dr. Lazarus Salines.  The patient was subsequently had her trach downsized      several times, ultimately to a #4 [place] metal trach and was      decannulated.  During this admission, her acute respiratory failure      resolved.  She is now completely decannulated with dressing over      her stoma.  She is awake and alert and able to talk clearly.  The      patient is saturating well on room air.  Lungs are clear.  She      appears to have no residual pulmonary difficulties from her acute      respiratory failure.  2. Arterial thromboembolism.  3. Peripheral vascular ischemia.  Vascular surgery followed the      patient closely throughout this admission and again the patient      required a fasciotomy and ultimately a left upper extremity      amputation, as well as a right index finger amputation due to      vascular ischemia.  She received __________ and multiple angiograms      for attempted removal of thrombus secondary to her SVC syndrome.      The patient is healing well from her amputations and will go to      inpatient rehab upon discharge for rehabilitation and again      vascular surgery managed these problems throughout the patient's      admission.  4. Anemia.  The patient did have some anemia throughout the      hospitalization and required multiple transfusions.  Hematology is      now following the patient and this anemia may be secondary to      multiple other hematology issues.  The patient will follow up on an       outpatient basis with hematology and oncology for multiple      problems.  5. Heparin-induced thrombocytopenia.  As stated in laboratory data, an  initial heparin-induced thrombocytopenia assay was negative,      however, the second one was positive.  Obviously, heparin was      stopped and the patient was managed on Argatroban for her      anticoagulation and is now transitioned to Coumadin.  Again,      hematology followed the patient closely throughout this admission      and will continue to manage the patient's anticoagulation, anemia      and thrombocytopenia issues during rehab and on an outpatient      basis.  PT and INR are now therapeutic on Coumadin.  The patient      has no evidence signs or symptoms of bleeding and her hemoglobin      and platelet levels are stable.  6. Hypokalemia.  This was an ongoing issue for the patient      intermittently throughout the hospitalization.  She received      multiple potassium replacements, as well as phosphorus replacements      as needed.  Issue at this time has resolved and potassium level on      last laboratory data was 3.5.  7. Urine cancer.  This was an initial problem for this patient prior      to admission to [facility]  Hematology and oncology is again      following.  The patient did receive one dose of chemotherapy before      admission and there are plans today on November 02, 2008, for CT scans      of the abdomen and pelvis per oncology for restaging of her cancer      and they will make further determinations over the next several      weeks as the patient improves in inpatient rehab as to whether she      will continue with chemotherapy.  The patient will follow up with      oncology on an outpatient basis as well.  8. Debilitation.  The patient is severely debilitated given her      lengthy hospitalization and continued problems.  Plan is for her to      be discharged to inpatient rehab.  She will also need rehab  given      her new left upper extremity amputation and recent left ankle ORIF.      The patient continues to improve but does need some continued rehab      for her debilitation.   DISCHARGE MEDICATIONS:  1. Synthroid 75 mg via tube daily.  2. Resource nutritional supplement 240 mL p.o. daily.  3. Seroquel 100 mg p.o. nightly.  4. Requip 0.5 mg p.o. nightly.  5. Coumadin - this is being managed and dosed on a daily basis per      pharmacy.  6. Tylenol 650 mg p.o. q.6 h., p.r.n. for pain or fever.  7. Haldol 2-4 mg IV q.3 h., p.r.n. for agitation.  8. Hydralazine 10-30 mg IV q.4 h., p.r.n. for systolic blood pressure      greater than 180.  9. Tussionex 5 mL p.o. q.12 h., p.r.n. as needed for cough.  10.Lopressor 2.5 mg IV q.3 h., for systolic blood pressure greater      than 180 or heart rate greater than 130.  11.Morphine 2 mg IV q.4 h., p.r.n. for severe pain.   DISPOSITION:  The patient is to be discharged to [facility].  We are currently awaiting bed assignment.  The patient has met  maximum benefit from her inpatient hospitalization and despite her  multiple severe problems throughout this hospitalization, she is  clinically greatly improved.  The patient is awake and alert and able to  participate in her care.   FOLLOWUP:  The patient is to follow up with her primary care physician  on an outpatient basis.  The patient will likely need to follow up with  orthopedics, as well as Dr. Noelle Penner related to her left ankle and left  upper extremity amputation.  The patient is also to follow up with Dr.  Truett Perna of hematology and oncology for ongoing management of her  uterine cancer, as well as her thrombocytopenia and anemia.   DIET:  The patient will be discharged on a regular diet.   ACTIVITY:  The patient is to be discharged to be activity per rehab  recommendations.      Dirk Dress, NP      Charlcie Cradle. Delford Field, MD, [facility]  Electronically  Signed    KW/MEDQ  D:  11/02/2008  T:  11/02/2008  Job:  [id number]   cc:   Loreta Ave, MD  Almedia Balls. Ranell Patrick, M.D.  Charlcie Cradle Delford Field, MD, FCCP  Gloris Manchester. Lazarus Salines, M.D.  Wilson Singer, M.D.  Janetta Hora. Darrick Penna, MD  Marolyn Hammock. Thad Ranger, M.D.  Leighton Roach. Truett Perna, M.D.

## 2011-01-24 IMAGING — PT NM PET TUM IMG INITIAL (PI) SKULL BASE T - THIGH
1 of 6 series · 1 of 25 positions shown · non-contrast
Comparison: None.

CLINICAL DATA: Initial treatment strategy for endometrial cancer.
Hysterectomy and bilateral salpingo-oophorectomy 06/15/2008.  The
patient also gives history of unspecified thyroid disorder.

NUCLEAR MEDICINE PET CT INITIAL (PI) SKULL BASE TO THIGH
08/23/2008:
TECHNIQUE: 17.0 mCi F-18 FDG was injected intravenously via the
right antecubital vein.  Full-ring PET imaging was performed from
the skull base through the mid-thighs 95  minutes after injection.
CT data was obtained and used for attenuation correction and
anatomic localization only.  (This was not acquired as a diagnostic
CT examination.)
Fasting Blood Glucose:  109

[Series 2: ct images · axial · 3.8mm · 0.98mm/px · 1 of 267 slices shown]
[im 267/267  brain]
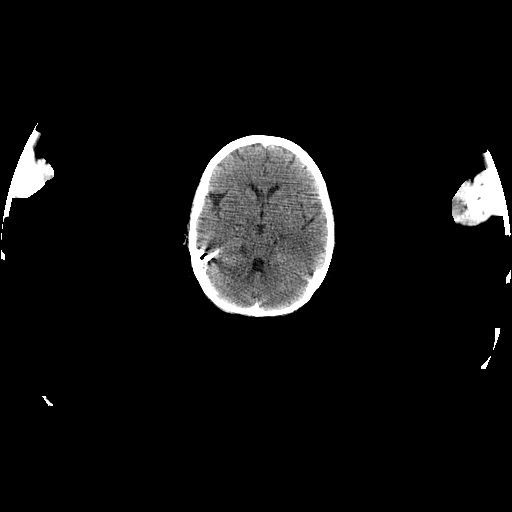

[1 of 25 positions shown; findings below may reference images not displayed]

FINDINGS: No abnormal hypermetabolic activity within the neck,
chest, abdomen or pelvis to suggest metastatic disease.  Mild
diffuse increased metabolic activity throughout the normal sized
thyroid gland, without focal nodularity.  No abnormal
hypermetabolic activity within the skeleton.

Expected activity in the left ventricle.  Expected excretion into
the urinary tract.  Expected activity within the luminal contents
of the large and small bowel.

Unenhanced CT images demonstrating a small pericardial effusion and
a large amount of stool throughout the colon from cecum to rectum.
Extensive opacification of both maxillary sinuses also noted.
IMPRESSION: 1.  No evidence of metastatic disease in the neck, chest, abdomen
or pelvis.
2.  Mild diffuse increased metabolic activity throughout the normal
sized thyroid gland.  The absence of focal nodular activity
suggests that this is not due to malignancy but rather due to
primary thyroid disease.
3.  Unenhanced CT images demonstrating a small pericardial
effusion, severe chronic bilateral maxillary sinusitis, and
possible constipation.

## 2011-10-05 ENCOUNTER — Encounter: Payer: Medicare Other | Admitting: Hematology and Oncology

## 2011-10-05 DIAGNOSIS — C549 Malignant neoplasm of corpus uteri, unspecified: Secondary | ICD-10-CM

## 2011-10-05 DIAGNOSIS — E039 Hypothyroidism, unspecified: Secondary | ICD-10-CM

## 2012-06-05 ENCOUNTER — Emergency Department (HOSPITAL_COMMUNITY)
Admission: EM | Admit: 2012-06-05 | Discharge: 2012-06-05 | Disposition: A | Discharge status: Home / Self Care | Payer: Medicare (Managed Care) | Attending: Emergency Medicine | Admitting: Emergency Medicine

## 2012-06-05 ENCOUNTER — Encounter (HOSPITAL_COMMUNITY): Payer: Self-pay | Admitting: *Deleted

## 2012-06-05 ENCOUNTER — Emergency Department (HOSPITAL_COMMUNITY): Payer: Medicare (Managed Care)

## 2012-06-05 DIAGNOSIS — Z8639 Personal history of other endocrine, nutritional and metabolic disease: Secondary | ICD-10-CM | POA: Insufficient documentation

## 2012-06-05 DIAGNOSIS — I10 Essential (primary) hypertension: Secondary | ICD-10-CM | POA: Insufficient documentation

## 2012-06-05 DIAGNOSIS — Z8679 Personal history of other diseases of the circulatory system: Secondary | ICD-10-CM | POA: Insufficient documentation

## 2012-06-05 DIAGNOSIS — Z862 Personal history of diseases of the blood and blood-forming organs and certain disorders involving the immune mechanism: Secondary | ICD-10-CM | POA: Insufficient documentation

## 2012-06-05 DIAGNOSIS — Y9301 Activity, walking, marching and hiking: Secondary | ICD-10-CM | POA: Insufficient documentation

## 2012-06-05 DIAGNOSIS — S8012XA Contusion of left lower leg, initial encounter: Secondary | ICD-10-CM

## 2012-06-05 DIAGNOSIS — Y929 Unspecified place or not applicable: Secondary | ICD-10-CM | POA: Insufficient documentation

## 2012-06-05 DIAGNOSIS — Z8540 Personal history of malignant neoplasm of unspecified female genital organ: Secondary | ICD-10-CM | POA: Insufficient documentation

## 2012-06-05 DIAGNOSIS — S8010XA Contusion of unspecified lower leg, initial encounter: Secondary | ICD-10-CM | POA: Insufficient documentation

## 2012-06-05 DIAGNOSIS — W010XXA Fall on same level from slipping, tripping and stumbling without subsequent striking against object, initial encounter: Secondary | ICD-10-CM | POA: Insufficient documentation

## 2012-06-05 HISTORY — DX: Migraine, unspecified, not intractable, without status migrainosus: G43.909

## 2012-06-05 HISTORY — DX: Essential (primary) hypertension: I10

## 2012-06-05 HISTORY — DX: Malignant (primary) neoplasm, unspecified: C80.1

## 2012-06-05 HISTORY — DX: Disorder of thyroid, unspecified: E07.9

## 2012-06-05 HISTORY — DX: Malignant neoplasm of endometrium: C54.1

## 2012-06-05 NOTE — ED Notes (Signed)
Pt fell coming down her steps this am after slipping on ice on the step, pt c/o pain to left lower leg, deformity,bruising, swelling noted to left lower leg, cms intact distal denies any other injuries.

## 2012-06-05 NOTE — ED Notes (Signed)
Discharge instructions reviewed with pt, questions answered. Pt verbalized understanding.  

## 2012-06-05 NOTE — ED Notes (Signed)
Ice pack applied to left lower leg,

## 2012-06-05 NOTE — ED Provider Notes (Signed)
History    This chart was scribed for Donnetta Hutching, MD by Marlin Canary. The patient was seen in room APA04/APA04. Patient's care was started at 0959.  CSN: [id number]  Arrival date & time 06/05/12  1191   First MD Initiated Contact with Patient 06/05/12 [phone]      Chief Complaint  Patient presents with  . Fall  . Leg Pain    (Consider location/radiation/quality/duration/timing/severity/associated sxs/prior treatment) The history is provided by the patient. No language interpreter was used.    [name] is a 67 y.o. female who presents to the Emergency Department complaining of constant moderate left lower leg pain onset 1-2 hours ago when she fell while walking down steps. Fell while walking down the steps. Pt reports the ice pack makes the leg pain worse. She denies any other symptoms.     Past Medical History  Diagnosis Date  . Hypertension   . Migraine headache   . Thyroid disease   . Cancer   . Endometrial cancer     Past Surgical History  Procedure Date  . 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 Finger amputation     right index finger at knuckle  . Port-a-cath removal   . Tonsillectomy   . Abdominal hysterectomy     No family history on file.  History  Substance Use Topics  . Smoking status: Never Smoker   . Smokeless tobacco: Not on file  . Alcohol Use: No    OB History    Grav Para Term Preterm Abortions TAB SAB Ect Mult Living                  Review of Systems  Cardiovascular: Positive for leg swelling.  All other systems reviewed and are negative.   A complete 10 system review of systems was obtained and all systems are negative except as noted in the HPI and PMH.   Allergies  Doxycycline; Heparin; and Ivp dye  Home Medications  No current outpatient prescriptions on file.  BP 135/74  Pulse 86  Temp 98 F (36.7 C) (Oral)  Ht 5\' 4"  (1.626 m)  Wt 154 lb (69.854 kg)  BMI 26.43 kg/m2  SpO2 99%  Physical Exam  Nursing note and vitals  reviewed. Constitutional: She is oriented to person, place, and time. She appears well-developed and well-nourished.  HENT:  Head: Normocephalic and atraumatic.  Eyes: Conjunctivae normal and EOM are normal. Pupils are equal, round, and reactive to light.  Neck: Normal range of motion. Neck supple.  Cardiovascular: Normal rate, regular rhythm and normal heart sounds.   Pulmonary/Chest: Effort normal and breath sounds normal.  Abdominal: Soft. Bowel sounds are normal.  Musculoskeletal: Normal range of motion. She exhibits tenderness.       Left mid anterior tibia has an area of echymosis 3/1 cm 3cm area of tenderness   Neurological: She is alert and oriented to person, place, and time.  Skin: Skin is warm and dry.  Psychiatric: She has a normal mood and affect.    ED Course  Procedures (including critical care time)  DIAGNOSTIC STUDIES: Oxygen Saturation is 99% on room air, Normal by my interpretation.    COORDINATION OF CARE:  1025- Ordered xray. Patient informed of clinical course, understand medical decision-making process, and agree with plan.  Labs Reviewed - No data to display No results found.   No diagnosis found.  Dg Tibia/fibula Left  06/05/2012  *RADIOLOGY REPORT*  Clinical Data: Leg pain.  Fall  LEFT TIBIA AND FIBULA - 2 VIEW  Comparison: None  Findings: There is no evidence of fracture or dislocation.  There is no evidence of arthropathy or other focal bone abnormality. There is a focal soft tissue swelling along the ventral aspect of the mid lower leg.  IMPRESSION: 1.  No acute bony abnormality. 2.  Soft tissue swelling.   Original Report Authenticated By: Signa Kell, M.D.     MDM  X-ray left tib-fib negative.    I personally performed the services described in this documentation, which was scribed in my presence. The recorded information has been reviewed and is accurate.      Donnetta Hutching, MD 06/05/12 1139

## 2012-06-05 NOTE — ED Notes (Signed)
Pt out to xray.

## 2014-11-12 ENCOUNTER — Other Ambulatory Visit: Payer: Self-pay | Admitting: Ophthalmology

## 2018-12-15 ENCOUNTER — Ambulatory Visit (INDEPENDENT_AMBULATORY_CARE_PROVIDER_SITE_OTHER): Payer: Medicare Other | Admitting: Neurology

## 2018-12-15 ENCOUNTER — Other Ambulatory Visit: Payer: Self-pay

## 2018-12-15 ENCOUNTER — Encounter: Payer: Self-pay | Admitting: *Deleted

## 2018-12-15 ENCOUNTER — Encounter: Payer: Self-pay | Admitting: Neurology

## 2018-12-15 VITALS — BP 152/87 | HR 78 | Temp 97.3°F | Ht 65.0 in | Wt 129.5 lb

## 2018-12-15 DIAGNOSIS — F5104 Psychophysiologic insomnia: Secondary | ICD-10-CM | POA: Diagnosis not present

## 2018-12-15 DIAGNOSIS — G2581 Restless legs syndrome: Secondary | ICD-10-CM

## 2018-12-15 DIAGNOSIS — R202 Paresthesia of skin: Secondary | ICD-10-CM | POA: Insufficient documentation

## 2018-12-15 MED ORDER — PREGABALIN 25 MG PO CAPS: 100.0000 mg | ORAL_CAPSULE | Freq: Every day | ORAL | 5 refills | Status: DC

## 2018-12-15 NOTE — Progress Notes (Signed)
PATIENT: [name] DOB: 1946-01-09  Chief Complaint  Patient presents with  . Restless Leg Syndrome/Insomnia    She is here with her husband, [name].  Reports having severe RLS that causes her difficulty sleeping.  She often has to get up to pace the floor.  She has tried Requip (nausea), clonazepam (stopped working) and gabapentin (dizziness) in the past.  Insomnia is also a problem for her so she is not getting much rest now.  Marland Kitchen PCP    Courtney Chroman, MD     HISTORICAL  [name] is a 73 year old female, seen in request by her primary care physician Dr. Woody Seller, Rennis Petty B, for evaluation of restless leg, chronic insomnia, she is accompanied by her husband at today's visit on December 15, 2018.  I have reviewed and summarized the referring note from the referring physician.  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  She walks regularly, wearing 1 pound ankle weight,  She complains recent onset bilateral toes paresthesia   REVIEW OF SYSTEMS: Full 14 system review of systems performed and notable only for as above All other review of systems were  negative.  ALLERGIES: Allergies  Allergen Reactions  . Doxycycline Other (See Comments)    Liver problems  . Heparin Other (See Comments)    Causes blood clots.  . Lipitor [Atorvastatin Calcium] Other (See Comments)    Musculoskeletal aches  . Lovastatin Diarrhea  . Requip [Ropinirole Hcl] Nausea Only  . Ivp Dye [Iodinated Diagnostic Agents] Hives and Rash    HOME MEDICATIONS: Current Outpatient Medications  Medication Sig Dispense Refill  . clonazePAM (KLONOPIN) 1 MG tablet Take 1.5 mg by mouth at bedtime. For RLS    . diltiazem (CARDIZEM CD) 120 MG 24 hr capsule Take 120 mg by mouth daily.    . diphenoxylate-atropine (LOMOTIL) 2.5-0.025 MG tablet Take 1 tablet by mouth 3 (three) times daily as needed for diarrhea or loose stools.    Marland Kitchen levothyroxine (SYNTHROID) 75 MCG tablet Take 75 mcg by mouth daily before breakfast.    . lisinopril (ZESTRIL) 10 MG tablet Take 1 tablet by mouth daily.    Marland Kitchen zonisamide (ZONEGRAN) 50 MG capsule Take 50 mg by mouth daily.     No current facility-administered medications for this visit.     PAST MEDICAL HISTORY: Past Medical History:  Diagnosis Date  . Dysphasia   . Endometrial cancer ([place])   . Heel spur   . 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 Hypercholesteremia   . Hypertension   . Hypothyroidism   . Migraine headache   . Osteopenia   . RLS (restless legs syndrome)   . SVC syndrome   .  Thyroid disease     PAST SURGICAL HISTORY: Past Surgical History:  Procedure Laterality Date  . ABDOMINAL HYSTERECTOMY    . ANKLE SURGERY Right   . 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 FINGER AMPUTATION     right index finger at knuckle  . PORT-A-CATH REMOVAL    . TONSILLECTOMY      FAMILY HISTORY: Family History  Problem Relation Age of Onset  . Stroke Mother   . Transient ischemic attack Father   . Dementia Father   . Pneumonia Father   . Diabetes Sister   . Rheum arthritis Sister   . Brain cancer Brother   . Breast cancer Maternal  Grandmother   . Stroke Paternal Grandmother     SOCIAL HISTORY: Social History   Socioeconomic History  . Marital status: Married    Spouse name: Not on file  . Number of children: 3  . Years of education: college  . Highest education level: Not on file  Occupational History  . Occupation: Retired Tour manager  . Financial resource strain: Not on file  . Food insecurity    Worry: Not on file    Inability: Not on file  . Transportation needs    Medical: Not on file    Non-medical: Not on file  Tobacco Use  . Smoking status: Never Smoker  . Smokeless tobacco: Never Used  Substance and Sexual Activity  . Alcohol use: No  . Drug use: No  . Sexual activity: Not on file  Lifestyle  . Physical activity    Days per week: Not on file    Minutes per session: Not on file  . Stress: Not on file  Relationships  . Social Herbalist on phone: Not on file    Gets together: Not on file    Attends religious service: Not on file    Active member of club or organization: Not on file    Attends meetings of clubs or organizations: Not on file    Relationship status: Not on file  . Intimate partner violence    Fear of current or ex partner: Not on file    Emotionally abused: Not on file    Physically abused: Not on file    Forced sexual activity: Not on file  Other Topics Concern  . Not on file  Social History Narrative   Lives at home with   Right-handed.   Caffeine use:  Cups per day.     PHYSICAL EXAM   Vitals:   12/15/18 1349  BP: (!) 152/87  Pulse: 78  Temp: (!) 97.3 F (36.3 C)  Weight: 129 lb 8 oz (58.7 kg)  Height: 5\' 5"  (1.651 m)    Not recorded      Body mass index is 21.55 kg/m.  PHYSICAL EXAMNIATION:  Gen: NAD, conversant, well nourised, obese, well groomed                     Cardiovascular: Regular rate rhythm, no peripheral edema, warm, nontender. Eyes: Conjunctivae clear without exudates or hemorrhage Neck: Supple, no carotid  bruits. Pulmonary: Clear to auscultation bilaterally   NEUROLOGICAL EXAM:  MENTAL STATUS: Speech:    Speech is normal; fluent and spontaneous with normal comprehension.  Cognition:     Orientation to time, place and person     Normal recent and remote memory     Normal Attention span and concentration     Normal Language,  naming, repeating,spontaneous speech     Fund of knowledge   CRANIAL NERVES: CN II: Visual fields are full to confrontation.  Pupils are round equal and briskly reactive to light. CN III, IV, VI: extraocular movement are normal. No ptosis. CN V: Facial sensation is intact to pinprick in all 3 divisions bilaterally. Corneal responses are intact.  CN VII: Face is symmetric with normal eye closure and smile. CN VIII: Hearing is normal to rubbing fingers CN IX, X: Palate elevates symmetrically. Phonation is normal. CN XI: Head turning and shoulder shrug are intact CN XII: Tongue is midline with normal movements and no atrophy.  MOTOR: Left above elbow amputation, right index finger amputation  REFLEXES: Reflexes are 2+ and symmetric at the biceps, triceps, knees, and ankles trace. Plantar responses are flexor.  SENSORY: Intact to light touch, pinprick, positional sensation and vibratory sensation are intact in fingers and toes.  COORDINATION: Rapid alternating movements and fine finger movements are intact. There is no dysmetria on finger-to-nose and heel-knee-shin.    GAIT/STANCE: Posture is normal. Gait is steady with normal steps, base, arm swing, and turning. Heel and toe walking are normal. Tandem gait is normal.  Romberg is absent.   DIAGNOSTIC DATA (LABS, IMAGING, TESTING) - I reviewed patient records, labs, notes, testing and imaging myself where available.   ASSESSMENT AND PLAN  Leatta Alewine Aristizabal is a 73 y.o. female   Bilateral feet paresthesia Restless leg symptoms Chronic insomnia  Laboratory evaluations for etiology  EMG nerve conduction  study  Trial low-dose Lyrica titrating to 25 mg 4 tablets every night   Marcial Pacas, M.D. Ph.D.  [facility] [address], [place], [place] [address] Ph: [phone] Fax: [phone]  CC: Courtney Chroman, MD

## 2018-12-19 LAB — CBC WITH DIFFERENTIAL/PLATELET
Basophils Absolute: 0.1 10*3/uL (ref 0.0–0.2)
Basos: 1 %
EOS (ABSOLUTE): 0.1 10*3/uL (ref 0.0–0.4)
Eos: 1 %
Hematocrit: 40.7 % (ref 34.0–46.6)
Hemoglobin: 13.8 g/dL (ref 11.1–15.9)
Immature Grans (Abs): 0 10*3/uL (ref 0.0–0.1)
Immature Granulocytes: 0 %
Lymphocytes Absolute: 1.6 10*3/uL (ref 0.7–3.1)
Lymphs: 25 %
MCH: 32.8 pg (ref 26.6–33.0)
MCHC: 33.9 g/dL (ref 31.5–35.7)
MCV: 97 fL (ref 79–97)
Monocytes Absolute: 0.4 10*3/uL (ref 0.1–0.9)
Monocytes: 7 %
Neutrophils Absolute: 4.3 10*3/uL (ref 1.4–7.0)
Neutrophils: 66 %
Platelets: 222 10*3/uL (ref 150–450)
RBC: 4.21 x10E6/uL (ref 3.77–5.28)
RDW: 13.1 % (ref 11.7–15.4)
WBC: 6.5 10*3/uL (ref 3.4–10.8)

## 2018-12-19 LAB — COMPREHENSIVE METABOLIC PANEL
ALT: 22 IU/L (ref 0–32)
AST: 14 IU/L (ref 0–40)
Albumin/Globulin Ratio: 2.4 — ABNORMAL HIGH (ref 1.2–2.2)
Albumin: 5 g/dL — ABNORMAL HIGH (ref 3.7–4.7)
Alkaline Phosphatase: 56 IU/L (ref 39–117)
BUN/Creatinine Ratio: 18 (ref 12–28)
BUN: 14 mg/dL (ref 8–27)
Bilirubin Total: 0.7 mg/dL (ref 0.0–1.2)
CO2: 22 mmol/L (ref 20–29)
Calcium: 9.7 mg/dL (ref 8.7–10.3)
Chloride: 91 mmol/L — ABNORMAL LOW (ref 96–106)
Creatinine, Ser: 0.77 mg/dL (ref 0.57–1.00)
GFR calc Af Amer: 89 mL/min/{1.73_m2} (ref >59)
GFR calc non Af Amer: 77 mL/min/{1.73_m2} (ref >59)
Globulin, Total: 2.1 g/dL (ref 1.5–4.5)
Glucose: 88 mg/dL (ref 65–99)
Potassium: 4.4 mmol/L (ref 3.5–5.2)
Sodium: 127 mmol/L — ABNORMAL LOW (ref 134–144)
Total Protein: 7.1 g/dL (ref 6.0–8.5)

## 2018-12-19 LAB — PROTEIN ELECTROPHORESIS
A/G Ratio: 2.1 — ABNORMAL HIGH (ref 0.7–1.7)
Albumin ELP: 4.8 g/dL — ABNORMAL HIGH (ref 2.9–4.4)
Alpha 1: 0.1 g/dL (ref 0.0–0.4)
Alpha 2: 0.6 g/dL (ref 0.4–1.0)
Beta: 1 g/dL (ref 0.7–1.3)
Gamma Globulin: 0.6 g/dL (ref 0.4–1.8)
Globulin, Total: 2.3 g/dL (ref 2.2–3.9)

## 2018-12-19 LAB — HGB A1C W/O EAG: Hgb A1c MFr Bld: 5.5 % (ref 4.8–5.6)

## 2018-12-19 LAB — VITAMIN D 25 HYDROXY (VIT D DEFICIENCY, FRACTURES): Vit D, 25-Hydroxy: 43.8 ng/mL (ref 30.0–100.0)

## 2018-12-19 LAB — TSH: TSH: 1.12 u[IU]/mL (ref 0.450–4.500)

## 2018-12-19 LAB — COPPER, SERUM: Copper: 101 ug/dL (ref 72–166)

## 2018-12-19 LAB — FERRITIN: Ferritin: 185 ng/mL — ABNORMAL HIGH (ref 15–150)

## 2018-12-19 LAB — CK: Total CK: 69 U/L (ref 32–182)

## 2018-12-19 LAB — RPR: RPR Ser Ql: NONREACTIVE

## 2018-12-19 LAB — C-REACTIVE PROTEIN: CRP: 2 mg/L (ref 0–10)

## 2018-12-19 LAB — FOLATE: Folate: 3.9 ng/mL (ref >3.0)

## 2018-12-19 LAB — SEDIMENTATION RATE: Sed Rate: 2 mm/hr (ref 0–40)

## 2018-12-19 LAB — VITAMIN B12: Vitamin B-12: >2000 pg/mL — ABNORMAL HIGH (ref 232–1245)

## 2018-12-19 LAB — ANA W/REFLEX IF POSITIVE: Anti Nuclear Antibody (ANA): NEGATIVE

## 2018-12-20 ENCOUNTER — Telehealth: Payer: Self-pay | Admitting: Neurology

## 2018-12-20 DIAGNOSIS — R899 Unspecified abnormal finding in specimens from other organs, systems and tissues: Secondary | ICD-10-CM

## 2018-12-20 NOTE — Telephone Encounter (Signed)
I have spoken to the patient.  She is aware of her lab results.  She is agreeable to come in for a BMP.

## 2018-12-20 NOTE — Telephone Encounter (Signed)
Please call patient, extensive laboratory evaluation showed mild elevated ferritin, which is a inflammatory marker, setting of normal ESR, C-reactive protein, there is unknown clinical significance, normal hemoglobin, no evidence of iron deficiency,  Evidence of mild decreased sodium, chloride, I have ordered repeat BMP, she may come into clinic for repeat testing,  Rest of the laboratory evaluation showed no significant abnormalities

## 2018-12-22 ENCOUNTER — Other Ambulatory Visit: Payer: Self-pay

## 2018-12-22 ENCOUNTER — Other Ambulatory Visit (INDEPENDENT_AMBULATORY_CARE_PROVIDER_SITE_OTHER): Payer: Self-pay

## 2018-12-22 DIAGNOSIS — Z0289 Encounter for other administrative examinations: Secondary | ICD-10-CM

## 2018-12-23 LAB — BASIC METABOLIC PANEL
BUN/Creatinine Ratio: 17 (ref 12–28)
BUN: 13 mg/dL (ref 8–27)
CO2: 20 mmol/L (ref 20–29)
Calcium: 9 mg/dL (ref 8.7–10.3)
Chloride: 99 mmol/L (ref 96–106)
Creatinine, Ser: 0.76 mg/dL (ref 0.57–1.00)
GFR calc Af Amer: 91 mL/min/{1.73_m2} (ref >59)
GFR calc non Af Amer: 79 mL/min/{1.73_m2} (ref >59)
Glucose: 91 mg/dL (ref 65–99)
Potassium: 4.5 mmol/L (ref 3.5–5.2)
Sodium: 132 mmol/L — ABNORMAL LOW (ref 134–144)

## 2018-12-26 ENCOUNTER — Telehealth: Payer: Self-pay | Admitting: Neurology

## 2018-12-26 NOTE — Telephone Encounter (Signed)
-----   Message from Marcial Pacas, MD sent at 12/26/2018 11:43 AM EDT ----- Please call patient, sodium level is mildly deceased 132 mmol/L, improved compared to previous level of 127,  She should control water intake, mildly increase salt intake and follow up with her PCP

## 2018-12-26 NOTE — Telephone Encounter (Signed)
Called the patient to advise of the lab results. Advised that sodium level was increased from previously before still on the lower then the normal. Advised the patient to increase sodium/salt intake and not to over hydrate which the patient says she doesn't do. Patient advised to follow up with PCP in regards to lab work. Pt verbalized understanding. Pt had no questions at this time but was encouraged to call back if questions arise.

## 2019-02-15 ENCOUNTER — Ambulatory Visit (INDEPENDENT_AMBULATORY_CARE_PROVIDER_SITE_OTHER): Payer: Medicare Other | Admitting: Neurology

## 2019-02-15 ENCOUNTER — Other Ambulatory Visit: Payer: Self-pay

## 2019-02-15 ENCOUNTER — Encounter (INDEPENDENT_AMBULATORY_CARE_PROVIDER_SITE_OTHER): Payer: Medicare Other | Admitting: Neurology

## 2019-02-15 DIAGNOSIS — G2581 Restless legs syndrome: Secondary | ICD-10-CM

## 2019-02-15 DIAGNOSIS — R202 Paresthesia of skin: Secondary | ICD-10-CM | POA: Diagnosis not present

## 2019-02-15 DIAGNOSIS — F5104 Psychophysiologic insomnia: Secondary | ICD-10-CM | POA: Diagnosis not present

## 2019-02-15 DIAGNOSIS — Z0289 Encounter for other administrative examinations: Secondary | ICD-10-CM

## 2019-02-15 MED ORDER — PREGABALIN 75 MG PO CAPS: ORAL_CAPSULE | ORAL | 5 refills | Status: DC

## 2019-02-15 NOTE — Procedures (Signed)
Full Name: [name] Gender: Female MRN #: [id number] Date of Birth: 16-Jun-1945    Visit Date: 02/15/2019 09:41 Age: 73 Years 0 Months Old Examining Physician: Marcial Pacas, MD  Referring Physician: Marcial Pacas, MD History:   73 year old female complains of restless leg symptoms, bilateral lower extremity paresthesia, intermittent right hand paresthesia  Summary of the tests:  Nerve conduction study:  Bilateral sural and superficial peroneal sensory responses showed moderately decreased snap amplitude, bilateral tibial, right peroneal to EDB motor responses were normal.  Right peroneal to EDB motor response showed mildly decreased the C map amplitude.  Right median sensory response showed moderately prolonged peak latency, with mildly decreased snap amplitude.  Right median motor responses showed mildly prolonged distal latency, with normal C map amplitude and conduction velocity.  Right ulnar sensory and motor responses were normal.  Electromyography:  Selective needle examinations were performed at bilateral lower extremity muscles, right upper extremity muscles, there was no significant abnormality noted.  conclusion: This is an abnormal study.  There is electrodiagnostic evidence of mild axonal sensorimotor polyneuropathy, in addition, there is evidence of moderate right median neuropathy across the wrist consistent with moderate carpal tunnel syndrome.    ------------------------------- Marcial Pacas, M.D. PhD  [facility] [place], [place] [address] Tel: [phone] Fax: [phone]        [facility]    Nerve / Sites Muscle Latency Ref. Amplitude Ref. Rel Amp Segments Distance Velocity Ref. Area    ms ms mV mV %  cm m/s m/s mVms  R Median - APB     Wrist APB 4.7 ?4.4 5.4 ?4.0 100 Wrist - APB 7   15.5     Upper arm APB 8.8  5.0  91.6 Upper arm - Wrist 21 52 ?49 15.2  R Ulnar - ADM     Wrist ADM 3.0 ?3.3 11.6 ?6.0 100 Wrist - ADM 7   36.7      B.Elbow ADM 6.3  10.2  87.4 B.Elbow - Wrist 18 55 ?49 34.8     A.Elbow ADM 8.2  10.3  101 A.Elbow - B.Elbow 10 52 ?49 35.4         A.Elbow - Wrist      R Peroneal - EDB     Ankle EDB 5.3 ?6.5 1.5 ?2.0 100 Ankle - EDB 9   5.2     Fib head EDB 12.1  1.1  71.5 Fib head - Ankle 29 43 ?44 4.8     Pop fossa EDB 14.0  1.2  112 Pop fossa - Fib head 10 55 ?44 5.0         Pop fossa - Ankle      L Peroneal - EDB     Ankle EDB 4.9 ?6.5 3.9 ?2.0 100 Ankle - EDB 9   12.2     Fib head EDB 12.4  2.7  68.8 Fib head - Ankle 29 39 ?44 11.0     Pop fossa EDB 14.6  2.6  97.8 Pop fossa - Fib head 10 46 ?44 11.0         Pop fossa - Ankle      R Tibial - AH     Ankle AH 3.8 ?5.8 4.2 ?4.0 100 Ankle - AH 9   8.3     Pop fossa AH 12.8  3.1  74.2 Pop fossa - Ankle 37 41 ?41 8.9  L Tibial - AH     Ankle  AH 4.5 ?5.8 5.9 ?4.0 100 Ankle - AH 9   13.9     Pop fossa AH 13.4  4.1  68.8 Pop fossa - Ankle 37 42 ?41 13.4                  SNC    Nerve / Sites Rec. Site Peak Lat Ref.  Amp Ref. Segments Distance    ms ms V V  cm  R Sural - Ankle (Calf)     Calf Ankle 3.5 ?4.4 3 ?6 Calf - Ankle 14  L Sural - Ankle (Calf)     Calf Ankle 3.8 ?4.4 2 ?6 Calf - Ankle 14  R Superficial peroneal - Ankle     Lat leg Ankle 4.8 ?4.4 2 ?6 Lat leg - Ankle 14  L Superficial peroneal - Ankle     Lat leg Ankle 4.1 ?4.4 3 ?6 Lat leg - Ankle 14  R Median - Orthodromic (Dig II, Mid palm)     Dig II Wrist 4.4 ?3.4 8 ?10 Dig II - Wrist 13  R Ulnar - Orthodromic, (Dig V, Mid palm)     Dig V Wrist 3.0 ?3.1 8 ?5 Dig V - Wrist 68                 F  Wave    Nerve F Lat Ref.   ms ms  R Tibial - AH 55.4 ?56.0  L Tibial - AH 55.6 ?56.0  R Ulnar - ADM 30.6 ?32.0           EMG       EMG Summary Table    Spontaneous MUAP Recruitment  Muscle IA Fib PSW Fasc Other Amp Dur. Poly Pattern  R. Tibialis anterior Normal None None None _______ Normal Normal Normal Normal  R. Tibialis posterior Normal None None None _______ Normal Normal  Normal Normal  R. Peroneus longus Normal None None None _______ Normal Normal Normal Normal  R. Vastus lateralis Normal None None None _______ Normal Normal Normal Normal  L. Tibialis anterior Normal None None None _______ Normal Normal Normal Normal  L. Tibialis posterior Normal None None None _______ Normal Normal Normal Normal  L. Gastrocnemius (Medial head) Normal None None None _______ Normal Normal Normal Normal  L. Vastus lateralis Normal None None None _______ Normal Normal Normal Normal  R. First dorsal interosseous Normal None None None _______ Normal Normal Normal Normal  R. Pronator teres Normal None None None _______ Normal Normal Normal Normal  R. Biceps brachii Normal None None None _______ Normal Normal Normal Normal  R. Deltoid Normal None None None _______ Normal Normal Normal Normal  R. Extensor digitorum communis Normal None None None _______ Normal Normal Normal Normal

## 2019-02-15 NOTE — Addendum Note (Signed)
Addended by: Marcial Pacas on: 02/15/2019 05:50 PM   Modules accepted: Level of Service

## 2019-02-15 NOTE — Progress Notes (Signed)
PATIENT: [name] DOB: 1945-11-20  No chief complaint on file.    HISTORICAL  [name] is a 73 year old female, seen in request by her primary care physician Dr. Woody Seller, Rennis Petty B, for evaluation of restless leg, chronic insomnia, she is accompanied by her husband at today's visit on December 15, 2018.  I have reviewed and summarized the referring note from the referring physician.  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  She walks regularly, wearing 1 pound ankle weight,  She complains recent onset bilateral toes paresthesia  Update February 15, 2019 She return for electrodiagnostic study today, which showed evidence of mild axonal sensorimotor polyneuropathy, and moderate right carpal tunnel syndrome.  Her sleep is much improved with Lyrica 25 mg 4 tablets every night, but she still get up occasionally pacing around walking 500 steps, read books before she can go back to sleep again  Previously she has tried melatonin which did help her sleep  I have suggested her increase Lyrica to 75 mg 2 tablets every night, may take extra 1 if she  needed, may mix with melatonin 1 mg every night for her restless leg symptoms  Laboratory evaluations showed hyponatremia with sodium level of 127, repeat test showed 133, normal CBC, vitamin D 44, ANA, copper level, ferritin was mildly elevated 185, protein electrophoresis showed no significant abnormality, A1c was 5.5, normal ESR, CPK, N82, RPR, folic acid, C-reactive protein, TSH,    REVIEW OF SYSTEMS: Full 14 system review of systems performed and notable only for as above All other review of systems were negative.  ALLERGIES: Allergies  Allergen Reactions  . Doxycycline Other (See Comments)    Liver problems  . Heparin Other (See Comments)    Causes blood clots.  . Lipitor [Atorvastatin Calcium] Other (See Comments)    Musculoskeletal aches  . Lisinopril Other (See Comments)    cough  . Lovastatin Diarrhea  . Requip [Ropinirole Hcl] Nausea Only  . Ivp Dye [Iodinated Diagnostic Agents] Hives and Rash    HOME MEDICATIONS: Current Outpatient Medications  Medication Sig Dispense Refill  . ACETAMINOPHEN PO Take 650 mg by mouth. Two tablets BID.    Marland Kitchen Ascorbic Acid (VITAMIN C) 1000 MG tablet Take 1,000 mg by mouth 2 (two) times a day.    . Biotin 10000 MCG TABS Take 1 tablet by mouth 3 (three) times daily.    Marland Kitchen CALCIUM LACTATE PO Taking 9 tablets daily.    . Cholecalciferol (VITAMIN D3 PO) Take 50 mcg by mouth 2 (two) times a day.    . clonazePAM (KLONOPIN) 1 MG tablet Take 1.5 mg by mouth at bedtime. For RLS    . Coenzyme  Q10 (CO Q 10) 100 MG CAPS Take 1 capsule by mouth daily.    . Cyanocobalamin (B-12 PO) Take 2,500 mcg by mouth 2 (two) times a day.    . diltiazem (CARDIZEM CD) 120 MG 24 hr capsule Take 120 mg by mouth daily.    Marland Kitchen levothyroxine (SYNTHROID) 75 MCG tablet Take 75 mcg by mouth daily before breakfast.    . Magnesium Malate 1250 (141.7 Mg) MG TABS Take 1,250 mg by mouth. Two tablets daily.    . Potassium 99 MG TABS Take 1 tablet by mouth 2 (two) times a day.    .  pregabalin (LYRICA) 75 MG capsule 2 tabs qhs, one extra capsule if needed. 90 capsule 5  . Probiotic Product (ALIGN PO) Take 1 tablet by mouth daily.    . Turmeric (QC TUMERIC COMPLEX) 500 MG CAPS Take 1 capsule by mouth 2 (two) times a day.    Marland Kitchen UNABLE TO FIND Med Name: Lidocaine Hydrochloride 4% nasal spray    . UNABLE TO FIND Take 500 mg by mouth 2 (two) times a day. Med Name: Beet Root    . UNABLE TO FIND Med Name: Cataplex F - Taking 9 tablets daily.    Marland Kitchen zonisamide (ZONEGRAN) 100 MG capsule Take 100 mg by mouth daily.     No current facility-administered medications for this visit.     PAST MEDICAL HISTORY: Past Medical History:  Diagnosis Date  . Dysphasia   . Endometrial cancer ([place])   . Heel spur   . 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 Hypercholesteremia   . Hypertension   . Hypothyroidism   . Migraine headache   . Osteopenia   . RLS (restless legs syndrome)   . SVC syndrome   . Thyroid disease     PAST SURGICAL HISTORY: Past Surgical History:  Procedure Laterality Date  . ABDOMINAL HYSTERECTOMY    . ANKLE SURGERY Right   . 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 FINGER AMPUTATION     right index finger at knuckle  . PORT-A-CATH REMOVAL    . TONSILLECTOMY      FAMILY HISTORY: Family History  Problem Relation Age of Onset  . Stroke Mother   . Transient ischemic attack Father   . Dementia Father   . Pneumonia Father   . Diabetes Sister   . Rheum arthritis Sister   . Brain cancer Brother   . Breast cancer Maternal Grandmother   . Stroke Paternal Grandmother     SOCIAL HISTORY: Social History   Socioeconomic History  . Marital status: Married    Spouse name: Not on file  . Number of children: 3  . Years of education: college  . Highest education level: Not on file  Occupational History  . Occupation: Retired Tour manager  . Financial resource strain: Not on file  . Food insecurity    Worry: Not on file    Inability: Not on file  .  Transportation needs    Medical: Not on file    Non-medical: Not on file  Tobacco Use  . Smoking status: Never Smoker  . Smokeless tobacco: Never Used  Substance and Sexual Activity  . Alcohol use: No  . Drug use: No  . Sexual activity: Not on file  Lifestyle  . Physical activity    Days per week: Not on file    Minutes per session: Not on file  . Stress: Not on file  Relationships  . Social  connections    Talks on phone: Not on file    Gets together: Not on file    Attends religious service: Not on file    Active member of club or organization: Not on file    Attends meetings of clubs or organizations: Not on file    Relationship status: Not on file  . Intimate partner violence    Fear of current or ex partner: Not on file    Emotionally abused: Not on file    Physically abused: Not on file    Forced sexual activity: Not on file  Other Topics Concern  . Not on file  Social History Narrative   Lives at home with husband.   Right-handed.   No caffeine use.     PHYSICAL EXAM   There were no vitals filed for this visit.  Not recorded      There is no height or weight on file to calculate BMI.  PHYSICAL EXAMNIATION:  Awake alert oriented to history taking care of conversation   CRANIAL NERVES: CN II: Visual fields are full to confrontation.  Pupils are round equal and briskly reactive to light. CN III, IV, VI: extraocular movement are normal. No ptosis. CN V: Facial sensation is intact to pinprick in all 3 divisions bilaterally. Corneal responses are intact.  CN VII: Face is symmetric with normal eye closure and smile. CN VIII: Hearing is normal to rubbing fingers CN IX, X: Phonation is normal. CN XI: Head turning and shoulder shrug are intact CN XII: Tongue is midline with normal movements and no atrophy.  MOTOR: Left above elbow amputation, right index finger amputation   There is no dysmetria noted  GAIT/STANCE: Posture is normal.   DIAGNOSTIC DATA  (LABS, IMAGING, TESTING) - I reviewed patient records, labs, notes, testing and imaging myself where available.   ASSESSMENT AND PLAN  [name] is a 73 y.o. female   Bilateral feet paresthesia Restless leg symptoms Chronic insomnia  EMG nerve conduction study showed evidence of mild length dependent axonal peripheral neuropathy, moderate right carpal tunnel syndromes.  She reported 40% improvement with current Lyrica 25 mg 4 tablets treatment, tolerating it well without significant side effect, will increase dose to Lyrica 75 mg 2 tablets every night, 1 extra 1 if needed may combine it with low-dose melatonin 1 mg every night  Laboratory evaluation showed hyponatremia  She is to continue follow-up with her primary care physician  Marcial Pacas, M.D. Ph.D.  [facility] [address], [place], [place] [address] Ph: [phone] Fax: [phone]  CC: Glenda Chroman, MD

## 2019-02-16 ENCOUNTER — Other Ambulatory Visit: Payer: Self-pay

## 2019-02-23 DIAGNOSIS — S52601A Unspecified fracture of lower end of right ulna, initial encounter for closed fracture: Secondary | ICD-10-CM | POA: Insufficient documentation

## 2019-05-10 NOTE — Progress Notes (Signed)
PATIENT: [name] DOB: 1946-02-24  REASON FOR VISIT: follow up HISTORY FROM: patient  HISTORY OF PRESENT ILLNESS: Today 05/11/19  HISTORY  [name] is a 73 year old female, seen in request by her primary care physician Dr. Woody Seller, Rennis Petty B, for evaluation of restless leg, chronic insomnia, she is accompanied by her husband at today's visit on December 15, 2018.  I have reviewed and summarized the referring note from the referring physician.  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  She walks regularly, wearing 1 pound ankle weight,  She complains recent onset bilateral toes paresthesia  Update February 15, 2019 She return for electrodiagnostic study today, which showed evidence of mild axonal sensorimotor polyneuropathy, and moderate right carpal tunnel syndrome.  Her sleep is much improved with Lyrica 25 mg 4 tablets every night, but she still get up occasionally pacing around walking 500 steps, read books before she can go back to sleep again  Previously she has tried melatonin which did help her sleep  I have suggested her  increase Lyrica to 75 mg 2 tablets every night, may take extra 1 if she needed, may mix with melatonin 1 mg every night for her restless leg symptoms  Laboratory evaluations showed hyponatremia with sodium level of 127, repeat test showed 133, normal CBC, vitamin D 44, ANA, copper level, ferritin was mildly elevated 185, protein electrophoresis showed no significant abnormality, A1c was 5.5, normal ESR, CPK, E52, RPR, folic acid, C-reactive protein, TSH,  Update May 11, 2019 SS: [name] presents for follow-up accompanied by her husband.  She is taking Lyrica 75 mg tablet, 1 at 10 pm, 2 at 11:15.  She indicates her sleep, and restless leg symptoms are under much better control, with Lyrica.  She says her symptoms are 80% better.  She is now sleeping 5 to 6 hours continuously.  She says she sometimes wears earplugs at night.  She is tolerating medication without side effect.  She does follow-up with her primary doctor, to discuss hyponatremia.   REVIEW OF SYSTEMS: Out of a complete 14 system review of symptoms, the patient complains only of the following symptoms, and all other reviewed systems are negative.  Insomnia, restless legs  ALLERGIES: Allergies  Allergen Reactions  . Iodinated Diagnostic Agents Hives and Rash  . Atorvastatin Calcium Other (See Comments)    Musculoskeletal aches Other reaction(s): Other (See Comments) Musculoskeletal aches  . Doxycycline Other (See Comments)    Liver problems Other reaction(s): Other (See Comments) Liver problems  . Heparin Other (See Comments)    Causes blood clots. Other reaction(s): Other (See Comments) Causes blood clots.  . Lisinopril Other (See Comments)    cough  Other reaction(s): Other (See Comments) cough  . Lovastatin Diarrhea  . Ropinirole Hcl Nausea Only    HOME MEDICATIONS: Outpatient Medications Prior to Visit  Medication Sig Dispense Refill  . ACETAMINOPHEN PO Take 650 mg by mouth. Two tablets BID.    Marland Kitchen Ascorbic  Acid (VITAMIN C) 1000 MG tablet Take 1,000 mg by mouth 2 (two) times a day.    . Biotin 10000 MCG TABS Take 1 tablet by mouth 3 (three) times daily.    . Cholecalciferol (VITAMIN D3 PO) Take 50 mcg by mouth 2 (two) times a day.    . clonazePAM (KLONOPIN) 1 MG tablet Take 1.5 mg by mouth at bedtime. For RLS    . Coenzyme Q10 (CO Q 10) 100 MG CAPS Take 1 capsule by mouth daily.    . Cyanocobalamin (B-12 PO) Take 2,500 mcg by mouth 2 (two) times a day.    . diltiazem (CARDIZEM CD) 120 MG 24 hr capsule Take 120 mg by mouth daily.    Marland Kitchen levothyroxine (SYNTHROID) 75 MCG tablet Take 75 mcg by mouth daily before breakfast.    . Magnesium Malate 1250 (141.7 Mg) MG TABS Take 1,250 mg by mouth. Two tablets daily.    . Potassium 99 MG TABS Take 1 tablet by mouth 2 (two) times a day.    . pregabalin (LYRICA) 75 MG capsule 2 tabs qhs, one extra capsule if needed. 90 capsule 5  . Probiotic Product (ALIGN PO) Take 1 tablet by mouth daily.    Marland Kitchen UNABLE TO FIND Med Name: Lidocaine Hydrochloride 4% nasal spray    . UNABLE TO FIND Take 500 mg by mouth 2 (two) times a day. Med Name: Beet Root    . zonisamide (ZONEGRAN) 100 MG capsule Take 100 mg by mouth daily.    Marland Kitchen CALCIUM LACTATE PO Taking 9 tablets daily.    . Turmeric (QC TUMERIC COMPLEX) 500 MG CAPS Take 1 capsule by mouth 2 (two) times a day.    Marland Kitchen UNABLE TO FIND Med Name: Cataplex F - Taking 9 tablets daily.     No facility-administered medications prior to visit.    PAST MEDICAL HISTORY: Past Medical History:  Diagnosis Date  . Dysphasia   . Endometrial cancer ([place])   . Heel spur   . 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 Hypercholesteremia   . Hypertension   . Hypothyroidism   . Migraine headache   . Osteopenia   . RLS (restless legs syndrome)   . SVC syndrome   . Thyroid disease     PAST SURGICAL HISTORY: Past Surgical History:  Procedure Laterality Date  . ABDOMINAL HYSTERECTOMY    . ANKLE SURGERY Right   . ARM AMPUTATION AT  HUMERUS    . FINGER AMPUTATION     right index finger at knuckle  . PORT-A-CATH REMOVAL    . TONSILLECTOMY      FAMILY HISTORY: Family History  Problem Relation Age of Onset  . Stroke Mother   . Transient ischemic attack Father   . Dementia Father   . Pneumonia Father   . Diabetes Sister   . Rheum arthritis Sister   . Brain cancer Brother   . Breast cancer Maternal Grandmother   . Stroke Paternal Grandmother     SOCIAL HISTORY: Social History   Socioeconomic History  . Marital status: Married    Spouse name: Not on file  . Number of children: 3  . Years of education: college  . Highest education  level: Not on file  Occupational History  . Occupation: Retired Pharmacist, hospital  Tobacco Use  . Smoking status: Never Smoker  . Smokeless tobacco: Never Used  Substance and Sexual Activity  . Alcohol use: No  . Drug use: No  . Sexual activity: Not on file  Other Topics Concern  . Not on file  Social History Narrative   Lives at home with husband.   Right-handed.   No caffeine use.   Social Determinants of Health   Financial Resource Strain:   . Difficulty of Paying Living Expenses: Not on file  Food Insecurity:   . Worried About Charity fundraiser in the Last Year: Not on file  . Ran Out of Food in the Last Year: Not on file  Transportation Needs:   . Lack of Transportation (Medical): Not on file  . Lack of Transportation (Non-Medical): Not on file  Physical Activity:   . Days of Exercise per Week: Not on file  . Minutes of Exercise per Session: Not on file  Stress:   . Feeling of Stress : Not on file  Social Connections:   . Frequency of Communication with Friends and Family: Not on file  . Frequency of Social Gatherings with Friends and Family: Not on file  . Attends Religious Services: Not on file  . Active Member of Clubs or Organizations: Not on file  . Attends Archivist Meetings: Not on file  . Marital Status: Not on file  Intimate Partner  Violence:   . Fear of Current or Ex-Partner: Not on file  . Emotionally Abused: Not on file  . Physically Abused: Not on file  . Sexually Abused: Not on file    PHYSICAL EXAM  Vitals:   05/11/19 1513  BP: (!) 164/88  Pulse: (!) 59  Temp: 97.6 F (36.4 C)  Weight: 137 lb 9.6 oz (62.4 kg)  Height: '5\' 4"'$  (1.626 m)   Body mass index is 23.62 kg/m.  Generalized: Well developed, in no acute distress   Neurological examination  Mentation: Alert oriented to time, place, history taking. Follows all commands speech and language fluent Cranial nerve II-XII: Pupils were equal round reactive to light. Extraocular movements were full, visual field were full on confrontational test. Facial sensation and strength were normal.  Head turning and shoulder shrug  were normal and symmetric. Motor: The motor testing reveals 5 over 5 strength of all 4 extremities. Good symmetric motor tone is noted throughout.  Sensory: Sensory testing is intact to soft touch on all 4 extremities. No evidence of extinction is noted.  Coordination: Cerebellar testing reveals good finger-nose-finger and heel-to-shin bilaterally.  Gait and station: Gait is normal. Tandem gait is mildly unsteady. Reflexes: Deep tendon reflexes are symmetric and normal bilaterally.   DIAGNOSTIC DATA (LABS, IMAGING, TESTING) - I reviewed patient records, labs, notes, testing and imaging myself where available.  Lab Results  Component Value Date   WBC 6.5 12/15/2018   HGB 13.8 12/15/2018   HCT 40.7 12/15/2018   MCV 97 12/15/2018   PLT 222 12/15/2018      Component Value Date/Time   NA 132 (L) 12/22/2018 1436   K 4.5 12/22/2018 1436   CL 99 12/22/2018 1436   CO2 20 12/22/2018 1436   GLUCOSE 91 12/22/2018 1436   GLUCOSE 106 (H) 11/08/2008 1130   BUN 13 12/22/2018 1436   CREATININE 0.76 12/22/2018 1436   CALCIUM 9.0 12/22/2018 1436   PROT 7.1 12/15/2018 1520   ALBUMIN 5.0 (  H) 12/15/2018 1520   AST 14 12/15/2018 1520   ALT 22  12/15/2018 1520   ALKPHOS 56 12/15/2018 1520   BILITOT 0.7 12/15/2018 1520   GFRNONAA 79 12/22/2018 1436   GFRAA 91 12/22/2018 1436   No results found for: CHOL, HDL, LDLCALC, LDLDIRECT, TRIG, CHOLHDL Lab Results  Component Value Date   HGBA1C 5.5 12/15/2018   Lab Results  Component Value Date   VITAMINB12 >2000 (H) 12/15/2018   Lab Results  Component Value Date   TSH 1.120 12/15/2018      ASSESSMENT AND PLAN 73 y.o. 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 here with:  1.  Restless leg syndrome 2.  Chronic insomnia -EMG nerve conduction evaluation showed evidence of mild length dependent axonal peripheral neuropathy, moderate right carpal tunnel syndrome -Overall, symptoms under better control, sleeping 5 to 6 hours continuously at night, reports she is 80% better with Lyrica -Continue Lyrica 75 mg, 2 tablets every night at 11:15 pm, take 1 tablet at 10 pm -Follow-up in 8 months or sooner if needed  I spent 15 minutes with the patient. 50% of this time was spent discussing her plan of care.   Butler Denmark, AGNP-C, DNP 05/11/2019, 4:25 PM [facility] [address], [place], [place] [address] [phone]

## 2019-05-11 ENCOUNTER — Ambulatory Visit: Payer: Medicare Other | Admitting: Neurology

## 2019-05-11 ENCOUNTER — Other Ambulatory Visit: Payer: Self-pay

## 2019-05-11 ENCOUNTER — Encounter: Payer: Self-pay | Admitting: Neurology

## 2019-05-11 VITALS — BP 164/88 | HR 59 | Temp 97.6°F | Ht 64.0 in | Wt 137.6 lb

## 2019-05-11 DIAGNOSIS — G2581 Restless legs syndrome: Secondary | ICD-10-CM | POA: Diagnosis not present

## 2019-05-11 DIAGNOSIS — F5104 Psychophysiologic insomnia: Secondary | ICD-10-CM

## 2019-05-11 DIAGNOSIS — R202 Paresthesia of skin: Secondary | ICD-10-CM

## 2019-05-11 NOTE — Patient Instructions (Signed)
Continue current dose of Lyrica  I am glad your symptoms are better  Return in 8 months or sooner if needed

## 2019-08-21 ENCOUNTER — Other Ambulatory Visit: Payer: Self-pay | Admitting: Neurology

## 2019-08-29 ENCOUNTER — Other Ambulatory Visit: Payer: Self-pay

## 2019-08-29 ENCOUNTER — Ambulatory Visit (INDEPENDENT_AMBULATORY_CARE_PROVIDER_SITE_OTHER): Payer: Medicare PPO

## 2019-08-29 ENCOUNTER — Encounter: Payer: Self-pay | Admitting: Podiatry

## 2019-08-29 ENCOUNTER — Ambulatory Visit: Payer: Medicare Other | Admitting: Podiatry

## 2019-08-29 DIAGNOSIS — M2041 Other hammer toe(s) (acquired), right foot: Secondary | ICD-10-CM | POA: Diagnosis not present

## 2019-08-29 DIAGNOSIS — Q828 Other specified congenital malformations of skin: Secondary | ICD-10-CM

## 2019-08-29 DIAGNOSIS — M2012 Hallux valgus (acquired), left foot: Secondary | ICD-10-CM

## 2019-08-29 DIAGNOSIS — M2011 Hallux valgus (acquired), right foot: Secondary | ICD-10-CM

## 2019-08-29 DIAGNOSIS — M2042 Other hammer toe(s) (acquired), left foot: Secondary | ICD-10-CM

## 2019-08-29 NOTE — Progress Notes (Signed)
Subjective:  Patient ID: [name], female    DOB: 11-02-1945,  MRN: [id number] HPI Chief Complaint  Patient presents with  . Foot Pain    1st MPJ bilateral (R>L) - aching x few months, deformity x years, 5th MPJ right -medial callus, shaves down, shoes get uncomfortable, redness at times  . New Patient (Initial Visit)    74 y.o. female presents with the above complaint.   ROS: Denies fever chills nausea vomiting muscle aches pains calf pain back pain chest pain shortness of breath.  Past Medical History:  Diagnosis Date  . Dysphasia   . Endometrial cancer ([place])   . Heel spur   . 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 Hypercholesteremia   . Hypertension   . Hypothyroidism   . Migraine headache   . Osteopenia   . RLS (restless legs syndrome)   . SVC syndrome   . Thyroid disease    Past Surgical History:  Procedure Laterality Date  . ABDOMINAL HYSTERECTOMY    . ANKLE SURGERY Right   . 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 FINGER AMPUTATION     right index finger at knuckle  . PORT-A-CATH REMOVAL    . TONSILLECTOMY      Current Outpatient Medications:  .  pregabalin (LYRICA) 75 MG capsule, TAKE 2 CAPSULES AT BEDTIME. MAY TAKE 1 EXTRA CAPSULE IF NEEDED., Disp: 90 capsule, Rfl: 5 .  ACETAMINOPHEN PO, Take 650 mg by mouth. Two tablets BID., Disp: , Rfl:  .  Ascorbic Acid (VITAMIN C) 1000 MG tablet, Take 1,000 mg by mouth 2 (two) times a day., Disp: , Rfl:  .  Biotin 10000 MCG TABS, Take 1 tablet by mouth 3 (three) times daily., Disp: , Rfl:  .  CALCIUM LACTATE PO, Taking 9 tablets daily., Disp: , Rfl:  .  Cholecalciferol (VITAMIN D3 PO), Take 50 mcg by mouth 2 (two) times a day., Disp: , Rfl:  .  clonazePAM (KLONOPIN) 1 MG tablet, Take 1.5 mg by mouth at bedtime. For RLS, Disp: , Rfl:  .  Coenzyme Q10 (CO Q 10) 100 MG CAPS, Take 1 capsule by mouth daily., Disp: , Rfl:  .  Cyanocobalamin (B-12 PO), Take 2,500 mcg by mouth 2 (two) times a day., Disp: , Rfl:  .  diltiazem  (CARDIZEM CD) 120 MG 24 hr capsule, Take 120 mg by mouth daily., Disp: , Rfl:  .  levothyroxine (SYNTHROID) 75 MCG tablet, Take 75 mcg by mouth daily before breakfast., Disp: , Rfl:  .  lisinopril (ZESTRIL) 10 MG tablet, , Disp: , Rfl:  .  Magnesium Malate 1250 (141.7 Mg) MG TABS, Take 1,250 mg by mouth. Two tablets daily., Disp: , Rfl:  .  Potassium 99 MG TABS, Take 1 tablet by mouth 2 (two) times a day., Disp: , Rfl:  .  Probiotic Product (ALIGN PO), Take 1 tablet by mouth daily., Disp: , Rfl:  .  Turmeric (QC TUMERIC COMPLEX) 500 MG CAPS, Take 1 capsule by mouth 2 (two) times a day., Disp: , Rfl:  .  UNABLE TO FIND, Med Name: Lidocaine Hydrochloride 4% nasal spray, Disp: , Rfl:  .  UNABLE TO FIND, Take 500 mg by mouth 2 (two) times a day. Med Name: Beet Root, Disp: , Rfl:  .  UNABLE TO FIND, Med Name: Cataplex F - Taking 9 tablets daily., Disp: , Rfl:  .  zonisamide (ZONEGRAN) 100 MG capsule, Take 100 mg by mouth daily., Disp: , Rfl:  Allergies  Allergen Reactions  . Iodinated Diagnostic Agents Hives and Rash  . Atorvastatin Calcium Other (See Comments)    Musculoskeletal aches Other reaction(s): Other (See Comments) Musculoskeletal aches  . Doxycycline Other (See Comments)    Liver problems Other reaction(s): Other (See Comments) Liver problems  . Heparin Other (See Comments)    Causes blood clots. Other reaction(s): Other (See Comments) Causes blood clots.  . Lisinopril Other (See Comments)    cough Other reaction(s): Other (See Comments) cough  . Lovastatin Diarrhea  . Ropinirole Hcl Nausea Only   Review of Systems Objective:  There were no vitals filed for this visit.  General: Well developed, nourished, in no acute distress, alert and oriented x3   Dermatological: Skin is warm, dry and supple bilateral. Nails x 10 are well maintained; remaining integument appears unremarkable at this time. There are no open sores, no preulcerative lesions, no rash or signs of  infection present.  Porokeratotic lesion lateral aspect of the fifth metatarsal right foot.   Vascular: Dorsalis Pedis artery and Posterior Tibial artery pedal pulses are 2/4 bilateral with immedate capillary fill time. Pedal hair growth present. No varicosities and no lower extremity edema present bilateral.   Neruologic: Grossly intact via light touch bilateral. Vibratory intact via tuning fork bilateral. Protective threshold with Semmes Wienstein monofilament intact to all pedal sites bilateral. Patellar and Achilles deep tendon reflexes 2+ bilateral. No Babinski or clonus noted bilateral.   Musculoskeletal: No gross boney pedal deformities bilateral. No pain, crepitus, or limitation noted with foot and ankle range of motion bilateral. Muscular strength 5/5 in all groups tested bilateral.  Increase in the first intermetatarsal angle greater than normal value is consistent with bunion deformities.  She has a prominence that is nontender and reducible.  She has good range of motion of the first metatarsophalangeal joint.  No limitation no crepitation.  Gait: Unassisted, Nonantalgic.    Radiographs:  Radiographs taken today demonstrate an osseously mature individual with mild osteopenia.  No osteoarthritic changes.  Increase in the first intermetatarsal angle greater than normal value with hallux abductus angle greater than normal value consistent with bunion deformities.  Lateral view does demonstrate mild hammertoe deformities.  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  None painful bunion deformities bilateral.  Plan: At this point I simply debrided the reactive hyperkeratotic lesion discussed the need for surgical intervention regarding the bunion deformities and hammertoes she states that at this time she is going the hammertoes and bunions as they are until the become more painful.     [name] T. [place], [place]

## 2019-09-27 NOTE — Progress Notes (Signed)
PATIENT: [name] DOB: 20-Sep-1945  REASON FOR VISIT: follow up HISTORY FROM: patient  HISTORY OF PRESENT ILLNESS: Today 09/28/19  HISTORY [name] a 74 year old female, seen in request by her primary care physician Dr. Woody Seller, Rennis Petty B, for evaluation of restless leg, chronic insomnia, she is accompanied by her husband at today's visit on December 15, 2018.  I have reviewed and summarized the referring note from the referring physician. 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  She walks regularly, wearing 1 pound ankle weight,  She complains recent onset bilateral toes paresthesia  Update February 15, 2019 She return for electrodiagnostic study today, which showed evidence of mild axonal sensorimotor polyneuropathy, and moderate right carpal tunnel syndrome.  Her sleep is much improved with Lyrica 25 mg 4 tablets every night, but she still get up occasionally pacing around walking 500 steps, read books before she can go back to sleep again  Previously she has tried melatonin which did help her sleep  I have suggested her  increase Lyrica to 75 mg 2 tablets every night, may take extra 1 if she needed, may mix with melatonin 1 mg every night for her restless leg symptoms  Laboratory evaluations showed hyponatremia with sodium level of 127, repeat test showed 133, normal CBC, vitamin D 44, ANA, copper level, ferritin was mildlyelevated 185, protein electrophoresis showed no significant abnormality,A1c was 5.5, normal ESR, CPK, H67, RPR, folic acid, C-reactive protein, TSH,  Update May 11, 2019 SS: [name] presents for follow-up accompanied by her husband.  She is taking Lyrica 75 mg tablet, 1 at 10 pm, 2 at 11:15.  She indicates her sleep, and restless leg symptoms are under much better control, with Lyrica.  She says her symptoms are 80% better.  She is now sleeping 5 to 6 hours continuously.  She says she sometimes wears earplugs at night.  She is tolerating medication without side effect.  She does follow-up with her primary doctor, to discuss hyponatremia.    Update Sep 28, 2019 SS: Here today for follow-up accompanied by her husband, complains of Lyrica wearing off, having more symptoms of RLS, trouble sleeping.  Is currently taking Lyrica 75 mg, 3 at bedtime, she walks 2 miles a day, walks 2000 steps before going to bed to make herself tired, she falls asleep around 11, then, up during the night, walking around.  Feels constant urge to move her feet, want to be in constant motion.  Has had this problem since her 61s, has briefly tried gabapentin, only 1 tablet, cause dizziness, clonazepam, Requip.  She is gradually tapering off Zonegran for headache, and insomnia. Wants to try higher dose of Lyrica.  REVIEW OF SYSTEMS: Out of a complete 14 system review of symptoms, the patient complains only of the following symptoms, and all other reviewed systems are negative.  Restless legs  ALLERGIES: Allergies  Allergen Reactions  . Iodinated Diagnostic Agents Hives and Rash  . Atorvastatin Calcium Other (See  Comments)    Musculoskeletal aches Other reaction(s): Other (See Comments) Musculoskeletal aches  . Doxycycline Other (See Comments)    Liver problems Other reaction(s): Other (See Comments) Liver problems  . Heparin Other (See Comments)    Causes blood clots. Other reaction(s): Other (See Comments) Causes blood clots.  . Lisinopril Other (See Comments)    cough Other reaction(s): Other (See Comments) cough  . Lovastatin Diarrhea  . Ropinirole Hcl Nausea Only    HOME MEDICATIONS: Outpatient Medications Prior to Visit  Medication Sig Dispense Refill  . ACETAMINOPHEN PO Take 325 mg by mouth. Two tablets BID.     Marland Kitchen Ascorbic Acid (VITAMIN C) 1000 MG tablet Take 1,000 mg by mouth 2 (two) times a day.    . b complex vitamins tablet Take 2 tablets by mouth daily.    . Biotin 10000 MCG TABS Take 1 tablet by mouth 3 (three) times daily.    Marland Kitchen CALCIUM LACTATE PO Taking 9 tablets daily.    . Cholecalciferol (VITAMIN D3 PO) Take 50 mcg by mouth 2 (two) times a day.    . Coenzyme Q10 (CO Q 10) 100 MG CAPS Take 1 capsule by mouth daily.    Marland Kitchen diltiazem (CARDIZEM CD) 120 MG 24 hr capsule Take 120 mg by mouth daily.    Marland Kitchen levothyroxine (SYNTHROID) 75 MCG tablet Take 75 mcg by mouth daily before breakfast.    . Magnesium Malate 1250 (141.7 Mg) MG TABS Take 1,250 mg by mouth. Two tablets daily.    . Potassium 99 MG TABS Take 1 tablet by mouth 2 (two) times a day.    . Probiotic Product (ALIGN PO) Take 1 tablet by mouth daily.    Marland Kitchen UNABLE TO FIND Med Name: Lidocaine Hydrochloride 4% nasal spray    . UNABLE TO FIND Take 500 mg by mouth 2 (two) times a day. Med Name: Beet Root    . zonisamide (ZONEGRAN) 100 MG capsule Take 50 mg by mouth daily.     . pregabalin (LYRICA) 75 MG capsule TAKE 2 CAPSULES AT BEDTIME. MAY TAKE 1 EXTRA CAPSULE IF NEEDED. 90 capsule 5  . clonazePAM (KLONOPIN) 1 MG tablet Take 1.5 mg by mouth at bedtime. For RLS    . Cyanocobalamin (B-12 PO) Take 2,500 mcg by mouth 2 (two)  times a day.    . lisinopril (ZESTRIL) 10 MG tablet     . Turmeric (QC TUMERIC COMPLEX) 500 MG CAPS Take 1 capsule by mouth 2 (two) times a day.    Marland Kitchen UNABLE TO FIND Med Name: Cataplex F - Taking 9 tablets daily.     No facility-administered medications prior to visit.    PAST MEDICAL HISTORY: Past Medical History:  Diagnosis Date  . Dysphasia   . Endometrial cancer ([place])   . Heel spur   . 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 Hypercholesteremia   . Hypertension   . Hypothyroidism   . Migraine headache   . Osteopenia   . RLS (restless legs syndrome)   . SVC syndrome   . Thyroid disease     PAST SURGICAL HISTORY: Past Surgical History:  Procedure Laterality Date  . ABDOMINAL HYSTERECTOMY    .  ANKLE SURGERY Right   . 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 FINGER AMPUTATION     right index finger at knuckle  . PORT-A-CATH REMOVAL    . TONSILLECTOMY      FAMILY HISTORY: Family History  Problem Relation Age of Onset  . Stroke Mother   . Transient ischemic attack Father   . Dementia Father   . Pneumonia Father   . Diabetes Sister   . Rheum arthritis Sister   . Brain cancer Brother   . Breast cancer Maternal Grandmother   . Stroke Paternal Grandmother     SOCIAL HISTORY: Social History   Socioeconomic History  . Marital status: Married    Spouse name: Not on file  . Number of children: 3  . Years of education: college  . Highest education level: Not on file  Occupational History  . Occupation: Retired Pharmacist, hospital  Tobacco Use  . Smoking status: Never Smoker  . Smokeless tobacco: Never Used  Substance and Sexual Activity  . Alcohol use: No  . Drug use: No  . Sexual activity: Not on file  Other Topics Concern  . Not on file  Social History Narrative   Lives at home with husband.   Right-handed.   No caffeine use.   Social Determinants of Health   Financial Resource Strain:   . Difficulty of Paying Living Expenses:   Food Insecurity:   . Worried About  Charity fundraiser in the Last Year:   . Arboriculturist in the Last Year:   Transportation Needs:   . Film/video editor (Medical):   Marland Kitchen Lack of Transportation (Non-Medical):   Physical Activity:   . Days of Exercise per Week:   . Minutes of Exercise per Session:   Stress:   . Feeling of Stress :   Social Connections:   . Frequency of Communication with Friends and Family:   . Frequency of Social Gatherings with Friends and Family:   . Attends Religious Services:   . Active Member of Clubs or Organizations:   . Attends Archivist Meetings:   Marland Kitchen Marital Status:   Intimate Partner Violence:   . Fear of Current or Ex-Partner:   . Emotionally Abused:   Marland Kitchen Physically Abused:   . Sexually Abused:    PHYSICAL EXAM  Vitals:   09/28/19 1517  BP: (!) 165/83  Pulse: 69  Temp: 97.8 F (36.6 C)  Weight: 142 lb (64.4 kg)  Height: '5\' 4"'$  (1.626 m)   Body mass index is 24.37 kg/m.  Generalized: Well developed, in no acute distress   Neurological examination  Mentation: Alert oriented to time, place, history taking. Follows all commands speech and language fluent Cranial nerve II-XII: Pupils were equal round reactive to light. Extraocular movements were full, visual field were full on confrontational test. Facial sensation and strength were normal. Head turning and shoulder shrug  were normal and symmetric. Motor: Left above elbow amputation Sensory: Sensory testing is intact to soft touch on all 4 extremities. No evidence of extinction is noted.  Coordination: Cerebellar testing reveals good finger-nose-finger and heel-to-shin bilaterally.  Gait and station: Uses cane, gait is slightly wide-based, but steady Reflexes: Deep tendon reflexes are symmetric and normal bilaterally.   DIAGNOSTIC DATA (LABS, IMAGING, TESTING) - I reviewed patient records, labs, notes, testing and imaging myself where available.  Lab Results  Component Value Date   WBC 6.5 12/15/2018   HGB  13.8 12/15/2018   HCT 40.7  12/15/2018   MCV 97 12/15/2018   PLT 222 12/15/2018      Component Value Date/Time   NA 132 (L) 12/22/2018 1436   K 4.5 12/22/2018 1436   CL 99 12/22/2018 1436   CO2 20 12/22/2018 1436   GLUCOSE 91 12/22/2018 1436   GLUCOSE 106 (H) 11/08/2008 1130   BUN 13 12/22/2018 1436   CREATININE 0.76 12/22/2018 1436   CALCIUM 9.0 12/22/2018 1436   PROT 7.1 12/15/2018 1520   ALBUMIN 5.0 (H) 12/15/2018 1520   AST 14 12/15/2018 1520   ALT 22 12/15/2018 1520   ALKPHOS 56 12/15/2018 1520   BILITOT 0.7 12/15/2018 1520   GFRNONAA 79 12/22/2018 1436   GFRAA 91 12/22/2018 1436   No results found for: CHOL, HDL, LDLCALC, LDLDIRECT, TRIG, CHOLHDL Lab Results  Component Value Date   HGBA1C 5.5 12/15/2018   Lab Results  Component Value Date   VITAMINB12 >2000 (H) 12/15/2018   Lab Results  Component Value Date   TSH 1.120 12/15/2018      ASSESSMENT AND PLAN 74 y.o. 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 here with:  1.  Restless leg syndrome 2.  Chronic insomnia -EMG nerve conduction evaluation showed evidence of mild length dependent axonal peripheral neuropathy, moderate right carpal tunnel syndrome -Laboratory evaluation has shown hyponatremia, following with PCP -Continued symptoms of RLS, even with taking Lyrica 75 mg, 3 at bedtime, was well controlled, but has increased of recent  -We will increase to Lyrica 100 mg, take 2 at bedtime, may take an extra if needed, discussed side effects with higher dose, Lyrica has helped her most -Follow-up in 6 months or sooner if needed  I spent 20 minutes of face-to-face and non-face-to-face time with patient.  This included previsit chart review, lab review, study review, order  entry, electronic health record documentation, patient education.  Butler Denmark, AGNP-C, DNP 09/28/2019, 4:13 PM [facility] [address] [place], [place] [address] [phone]

## 2019-09-28 ENCOUNTER — Other Ambulatory Visit: Payer: Self-pay

## 2019-09-28 ENCOUNTER — Ambulatory Visit: Payer: Medicare PPO | Admitting: Neurology

## 2019-09-28 ENCOUNTER — Encounter: Payer: Self-pay | Admitting: Neurology

## 2019-09-28 VITALS — BP 165/83 | HR 69 | Temp 97.8°F | Ht 64.0 in | Wt 142.0 lb

## 2019-09-28 DIAGNOSIS — G2581 Restless legs syndrome: Secondary | ICD-10-CM | POA: Diagnosis not present

## 2019-09-28 DIAGNOSIS — F5104 Psychophysiologic insomnia: Secondary | ICD-10-CM

## 2019-09-28 MED ORDER — PREGABALIN 100 MG PO CAPS
ORAL_CAPSULE | ORAL | 5 refills | Status: DC
Start: 2019-09-28 — End: 2020-02-26

## 2019-09-28 NOTE — Patient Instructions (Signed)
Increase Lyrica to 100 mg capsules, take 2 at bedtime, may take extra capsule if needed See you back in 6 months or sooner if needed  Pregabalin capsules What is this medicine? PREGABALIN (pre GAB a lin) is used to treat nerve pain from diabetes, shingles, spinal cord injury, and fibromyalgia. It is also used to control seizures in epilepsy. This medicine may be used for other purposes; ask your health care provider or pharmacist if you have questions. COMMON BRAND NAME(S): Lyrica What should I tell my health care provider before I take this medicine? They need to know if you have any of these conditions:  heart disease  history of drug abuse or alcohol abuse problem  kidney disease  lung or breathing disease  suicidal thoughts, plans, or attempt; a previous suicide attempt by you or a family member  an unusual or allergic reaction to pregabalin, gabapentin, other medicines, foods, dyes, or preservatives  pregnant or trying to get pregnant  breast-feeding How should I use this medicine? Take this medicine by mouth with a glass of water. Follow the directions on the prescription label. You can take it with or without food. If it upsets your stomach, take it with food. Take your medicine at regular intervals. Do not take it more often than directed. Do not stop taking except on your doctor's advice. A special MedGuide will be given to you by the pharmacist with each prescription and refill. Be sure to read this information carefully each time. Talk to your pediatrician regarding the use of this medicine in children. While this drug may be prescribed for children as young as 1 month for selected conditions, precautions do apply. Overdosage: If you think you have taken too much of this medicine contact a poison control center or emergency room at once. NOTE: This medicine is only for you. Do not share this medicine with others. What if I miss a dose? If you miss a dose, take it as soon  as you can. If it is almost time for your next dose, take only that dose. Do not take double or extra doses. What may interact with this medicine? This medicine may interact with the following medications:  alcohol  antihistamines for allergy, cough, and cold  certain medicines for anxiety or sleep  certain medicines for depression like amitriptyline, fluoxetine, sertraline  certain medicines for diabetes  certain medicines for seizures like phenobarbital, primidone  general anesthetics like halothane, isoflurane, methoxyflurane, propofol  local anesthetics like lidocaine, pramoxine, tetracaine  medicines that relax muscles for surgery  narcotic medicines for pain  phenothiazines like chlorpromazine, mesoridazine, prochlorperazine, thioridazine This list may not describe all possible interactions. Give your health care provider a list of all the medicines, herbs, non-prescription drugs, or dietary supplements you use. Also tell them if you smoke, drink alcohol, or use illegal drugs. Some items may interact with your medicine. What should I watch for while using this medicine? Tell your doctor or healthcare professional if your symptoms do not start to get better or if they get worse. Visit your doctor or health care professional for regular checks on your progress. Do not stop taking except on your doctor's advice. You may develop a severe reaction. Your doctor will tell you how much medicine to take. Wear a medical identification bracelet or chain if you are taking this medicine for seizures, and carry a card that describes your disease and details of your medicine and dosage times. You may get drowsy or dizzy. Do not  drive, use machinery, or do anything that needs mental alertness until you know how this medicine affects you. Do not stand or sit up quickly, especially if you are an older patient. This reduces the risk of dizzy or fainting spells. Alcohol may interfere with the effect  of this medicine. Avoid alcoholic drinks. If you have a heart condition, like congestive heart failure, and notice that you are retaining water and have swelling in your hands or feet, contact your health care provider immediately. The use of this medicine may increase the chance of suicidal thoughts or actions. Pay special attention to how you are responding while on this medicine. Any worsening of mood, or thoughts of suicide or dying should be reported to your health care professional right away. This medicine has caused reduced sperm counts in some men. This may interfere with the ability to father a child. You should talk to your doctor or health care professional if you are concerned about your fertility. Women who become pregnant while using this medicine for seizures may enroll in the [place] Pregnancy Registry by calling [phone]. This registry collects information about the safety of antiepileptic drug use during pregnancy. What side effects may I notice from receiving this medicine? Side effects that you should report to your doctor or health care professional as soon as possible:  allergic reactions like skin rash, itching or hives, swelling of the face, lips, or tongue  breathing problems  changes in vision  chest pain  confusion  jerking or unusual movements of any part of your body  loss of memory  muscle pain, tenderness, or weakness  suicidal thoughts or other mood changes  swelling of the ankles, feet, hands  unusual bruising or bleeding Side effects that usually do not require medical attention (report to your doctor or health care professional if they continue or are bothersome):  dizziness  drowsiness  dry mouth  headache  nausea  tremors  trouble sleeping  weight gain This list may not describe all possible side effects. Call your doctor for medical advice about side effects. You may report side effects to FDA at  1-800-FDA-1088. Where should I keep my medicine? Keep out of the reach of children. This medicine can be abused. Keep your medicine in a safe place to protect it from theft. Do not share this medicine with anyone. Selling or giving away this medicine is dangerous and against the law. This medicine may cause accidental overdose and death if it taken by other adults, children, or pets. Mix any unused medicine with a substance like cat litter or coffee grounds. Then throw the medicine away in a sealed container like a sealed bag or a coffee can with a lid. Do not use the medicine after the expiration date. Store at room temperature between 15 and 30 degrees C (59 and 86 degrees F). NOTE: This sheet is a summary. It may not cover all possible information. If you have questions about this medicine, talk to your doctor, pharmacist, or health care provider.  2020 Elsevier/Gold Standard (2018-05-13 13:15:55)

## 2019-10-03 NOTE — Progress Notes (Signed)
I have reviewed and agreed above plan. 

## 2019-12-07 DIAGNOSIS — G43019 Migraine without aura, intractable, without status migrainosus: Secondary | ICD-10-CM | POA: Diagnosis not present

## 2019-12-07 DIAGNOSIS — G43719 Chronic migraine without aura, intractable, without status migrainosus: Secondary | ICD-10-CM | POA: Diagnosis not present

## 2019-12-19 DIAGNOSIS — Z79899 Other long term (current) drug therapy: Secondary | ICD-10-CM | POA: Diagnosis not present

## 2019-12-19 DIAGNOSIS — Z1331 Encounter for screening for depression: Secondary | ICD-10-CM | POA: Diagnosis not present

## 2019-12-19 DIAGNOSIS — E039 Hypothyroidism, unspecified: Secondary | ICD-10-CM | POA: Diagnosis not present

## 2019-12-19 DIAGNOSIS — S58119A Complete traumatic amputation at level between elbow and wrist, unspecified arm, initial encounter: Secondary | ICD-10-CM | POA: Diagnosis not present

## 2019-12-19 DIAGNOSIS — Z0000 Encounter for general adult medical examination without abnormal findings: Secondary | ICD-10-CM | POA: Diagnosis not present

## 2019-12-19 DIAGNOSIS — Z299 Encounter for prophylactic measures, unspecified: Secondary | ICD-10-CM | POA: Diagnosis not present

## 2019-12-19 DIAGNOSIS — Z1339 Encounter for screening examination for other mental health and behavioral disorders: Secondary | ICD-10-CM | POA: Diagnosis not present

## 2019-12-19 DIAGNOSIS — R5383 Other fatigue: Secondary | ICD-10-CM | POA: Diagnosis not present

## 2019-12-19 DIAGNOSIS — E7800 Pure hypercholesterolemia, unspecified: Secondary | ICD-10-CM | POA: Diagnosis not present

## 2019-12-19 DIAGNOSIS — Z7189 Other specified counseling: Secondary | ICD-10-CM | POA: Diagnosis not present

## 2019-12-19 DIAGNOSIS — Z1211 Encounter for screening for malignant neoplasm of colon: Secondary | ICD-10-CM | POA: Diagnosis not present

## 2019-12-19 DIAGNOSIS — I10 Essential (primary) hypertension: Secondary | ICD-10-CM | POA: Diagnosis not present

## 2019-12-19 DIAGNOSIS — Z6823 Body mass index (BMI) 23.0-23.9, adult: Secondary | ICD-10-CM | POA: Diagnosis not present

## 2020-01-09 ENCOUNTER — Ambulatory Visit: Payer: Medicare Other | Admitting: Neurology

## 2020-02-23 ENCOUNTER — Other Ambulatory Visit: Payer: Self-pay | Admitting: Neurology

## 2020-02-26 ENCOUNTER — Telehealth: Payer: Self-pay | Admitting: Neurology

## 2020-02-26 NOTE — Telephone Encounter (Signed)
I called pt and she is taking 100mg  2-3 daily at bedtime.  Should have enough until her appt 04-03-20 (of may need another prescription depending how many she has left prior to picking up new prescription).

## 2020-02-26 NOTE — Telephone Encounter (Signed)
Pt called stating she is needing her pregabalin (LYRICA) 100 MG capsule called in to the [facility] states she takes 3 75mg  caps a day. Please advise.

## 2020-03-18 DIAGNOSIS — Z299 Encounter for prophylactic measures, unspecified: Secondary | ICD-10-CM | POA: Diagnosis not present

## 2020-03-18 DIAGNOSIS — I10 Essential (primary) hypertension: Secondary | ICD-10-CM | POA: Diagnosis not present

## 2020-03-18 DIAGNOSIS — E039 Hypothyroidism, unspecified: Secondary | ICD-10-CM | POA: Diagnosis not present

## 2020-03-25 ENCOUNTER — Other Ambulatory Visit: Payer: Self-pay | Admitting: Neurology

## 2020-04-02 DIAGNOSIS — E2839 Other primary ovarian failure: Secondary | ICD-10-CM | POA: Diagnosis not present

## 2020-04-03 ENCOUNTER — Encounter: Payer: Self-pay | Admitting: Neurology

## 2020-04-03 ENCOUNTER — Ambulatory Visit: Payer: Medicare PPO | Admitting: Neurology

## 2020-04-03 VITALS — BP 128/82 | Ht 64.0 in | Wt 143.0 lb

## 2020-04-03 DIAGNOSIS — G2581 Restless legs syndrome: Secondary | ICD-10-CM | POA: Diagnosis not present

## 2020-04-03 DIAGNOSIS — F5104 Psychophysiologic insomnia: Secondary | ICD-10-CM

## 2020-04-03 MED ORDER — PREGABALIN 100 MG PO CAPS: ORAL_CAPSULE | ORAL | 3 refills | Status: DC

## 2020-04-03 NOTE — Patient Instructions (Signed)
Continue the Lyrica at current dosing  If symptoms worsen let me know See you back in 6 months

## 2020-04-03 NOTE — Progress Notes (Signed)
PATIENT: [name] DOB: 04/06/1946  REASON FOR VISIT: follow up HISTORY FROM: patient  HISTORY OF PRESENT ILLNESS: Today 04/03/20  HISTORY  [name] a 74 year old female, seen in request by her primary care physician Dr. Woody Weaver, [name], for evaluation of restless leg, chronic insomnia, she is accompanied by her husband at today's visit on December 15, 2018.  I have reviewed and summarized the referring note from the referring physician. 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  She walks regularly, wearing 1 pound ankle weight,  She complains recent onset bilateral toes paresthesia  Update February 15, 2019 She return for electrodiagnostic study today, which showed evidence of mild axonal sensorimotor polyneuropathy, and moderate right carpal tunnel syndrome.  Her sleep is much improved with Lyrica 25 mg 4 tablets every night, but she still get up occasionally pacing around walking 500 steps, read books before she can go back to sleep again  Previously she has tried melatonin which did help her sleep  I have suggested her  increase Lyrica to 75 mg 2 tablets every night, may take extra 1 if she needed, may mix with melatonin 1 mg every night for her restless leg symptoms  Laboratory evaluations showed hyponatremia with sodium level of 127, repeat test showed 133, normal CBC, vitamin D 44, ANA, copper level, ferritin was mildlyelevated 185, protein electrophoresis showed no significant abnormality,A1c was 5.5, normal ESR, CPK, T51, RPR, folic acid, C-reactive protein, TSH,  Update December 17, 2020SS:Ms. Ewingpresents for follow-up accompanied by her husband. She is taking Lyrica 75 mg tablet, 1 at 10 pm, 2 at 11:15.She indicates her sleep, andrestless leg symptoms are under much better control, with Lyrica. She says her symptoms are 80% better. She is now sleeping 5 to 6 hours continuously. She says she sometimes wears earplugs at night. She is tolerating medication without side effect. She does follow-up with her primary doctor, to discusshyponatremia.   Update Sep 28, 2019 SS: Here today for follow-up accompanied by her husband, complains of Lyrica wearing off, having more symptoms of RLS, trouble sleeping.  Is currently taking Lyrica 75 mg, 3 at bedtime, she walks 2 miles a day, walks 2000 steps before going to bed to make herself tired, she falls asleep around 11, then, up during the night, walking around.  Feels constant urge to move her feet, want to be in constant motion.  Has had this problem since her 77s, has briefly tried gabapentin, only 1 tablet, cause dizziness, clonazepam, Requip.  She is gradually tapering off Zonegran for headache, and insomnia. Wants to try higher dose of Lyrica.   Update April 03, 2020 SS: Here today with her husband, currently taking  Lyrica 100 mg, 3 capsules at 8:00 pm, then going to bed at 11:30.  She does activities on her [place] before bed, walks at least 1500 steps to make sure her legs are tired.  Lyrica has been most effective, will sleep for 4 hours, and wake up  to use the bathroom, usually go back to sleep for another 1 to 2 hours.  This is good for her.  Tapering off Zonegran, currently at 25 mg daily for headache.  No headache in 1-2 years.  Tolerating Lyrica well.  REVIEW OF SYSTEMS: Out of a complete 14 system review of symptoms, the patient complains only of the following symptoms, and all other reviewed systems are negative.  Restless legs  ALLERGIES: Allergies  Allergen Reactions  . Iodinated Diagnostic Agents Hives and Rash  . Atorvastatin Calcium Other (See Comments)    Musculoskeletal aches Other reaction(s): Other (See Comments) Musculoskeletal aches  . Doxycycline Other (See Comments)    Liver problems Other reaction(s): Other (See Comments) Liver problems  . Heparin Other (See Comments)    Causes blood clots. Other reaction(s): Other (See Comments) Causes blood clots.  . Lisinopril Other (See Comments)    cough Other reaction(s): Other (See Comments) cough  . Lovastatin Diarrhea  . Ropinirole Hcl Nausea Only    HOME MEDICATIONS: Outpatient Medications Prior to Visit  Medication Sig Dispense Refill  . ACETAMINOPHEN PO Take 325 mg by mouth. One tablet BID    . Ascorbic Acid (VITAMIN C) 1000 MG tablet Take 1,000 mg by mouth 2 (two) times a day.    . Weaver complex vitamins tablet Take 2 tablets by mouth daily.    . Biotin 10000 MCG TABS Take 1 tablet by mouth 3 (three) times daily.    . Coenzyme Q10 (CO Q 10) 100 MG CAPS Take 1 capsule by mouth daily.    Marland Kitchen diltiazem (CARDIZEM CD) 120 MG 24 hr capsule Take 120 mg by mouth daily.    Marland Kitchen levothyroxine (SYNTHROID) 75 MCG tablet Take 75 mcg by mouth daily before breakfast.    . Magnesium Malate 1250 (141.7 Mg) MG TABS Take 1,250 mg by mouth. Two tablets daily.    . Potassium 99 MG TABS Take 1 tablet by mouth 2 (two) times a day.    . Probiotic Product (ALIGN PO) Take 1 tablet by mouth daily.    Marland Kitchen UNABLE TO FIND Med Name: Lidocaine Hydrochloride 4% nasal spray    . UNABLE TO FIND  Take 500 mg by mouth 2 (two) times a day. Med Name: Beet Root    . zonisamide (ZONEGRAN) 25 MG capsule Take 25 mg by mouth daily.    . pregabalin (LYRICA) 100 MG capsule TAKE 2 CAPSULES AT BEDTIME, MAY TAKE 1 EXTRA IF NEEDED. 90 capsule 0  . CALCIUM LACTATE PO Taking 9 tablets daily.    . Cholecalciferol (VITAMIN D3 PO) Take 50 mcg by mouth 2 (two) times a day.    . zonisamide (ZONEGRAN) 100 MG capsule Take 50 mg by mouth daily.      No facility-administered medications prior to visit.    PAST MEDICAL HISTORY: Past Medical History:  Diagnosis Date  . Dysphasia   . Endometrial cancer ([place])   . Heel spur   . 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 Hypercholesteremia   . Hypertension   . Hypothyroidism   . Migraine headache   . Osteopenia   . RLS (restless legs syndrome)   . SVC syndrome   . Thyroid  disease     PAST SURGICAL HISTORY: Past Surgical History:  Procedure Laterality Date  . ABDOMINAL HYSTERECTOMY    . ANKLE SURGERY Right   . 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 FINGER AMPUTATION     right index finger at knuckle  . PORT-A-CATH REMOVAL    . TONSILLECTOMY      FAMILY HISTORY: Family History  Problem Relation Age of Onset  . Stroke Mother   . Transient ischemic attack Father   . Dementia Father   . Pneumonia Father   . Diabetes Sister   . Rheum arthritis Sister   . Brain cancer Brother   . Breast cancer Maternal Grandmother   . Stroke Paternal Grandmother     SOCIAL HISTORY: Social History   Socioeconomic History  . Marital status: Married    Spouse name: Not on file  . Number of children: 3  . Years of education: college  . Highest education level: Not on file  Occupational History  . Occupation: Retired Pharmacist, hospital  Tobacco Use  . Smoking status: Never Smoker  . Smokeless tobacco: Never Used  Substance and Sexual Activity  . Alcohol use: No  . Drug use: No  . Sexual activity: Not on file  Other Topics Concern  . Not on file  Social History  Narrative   Lives at home with husband.   Right-handed.   No caffeine use.   Social Determinants of Health   Financial Resource Strain:   . Difficulty of Paying Living Expenses: Not on file  Food Insecurity:   . Worried About Charity fundraiser in the Last Year: Not on file  . Ran Out of Food in the Last Year: Not on file  Transportation Needs:   . Lack of Transportation (Medical): Not on file  . Lack of Transportation (Non-Medical): Not on file  Physical Activity:   . Days of Exercise per Week: Not on file  . Minutes of Exercise per Session: Not on file  Stress:   . Feeling of Stress : Not on file  Social Connections:   . Frequency of Communication with Friends and Family: Not on file  . Frequency of Social Gatherings with Friends and Family: Not on file  . Attends Religious Services: Not on file  . Active Member of Clubs or Organizations: Not on file  . Attends Archivist Meetings: Not on file  . Marital Status: Not on file  Intimate Partner Violence:   . Fear of Current or Ex-Partner: Not on file  . Emotionally Abused: Not on file  . Physically Abused: Not on file  . Sexually Abused: Not on file   PHYSICAL EXAM  Vitals:   04/03/20 1412  BP: 128/82  Weight: 143 lb (64.9 kg)  Height: _0  (1.626 m)   Body mass index is 24.55 kg/m.  Generalized: Well developed, in no acute distress   Neurological examination  Mentation: Alert oriented to time, place, history taking. Follows all commands speech and language fluent Cranial nerve II-XII: Pupils were equal round reactive to light. Extraocular movements were full, visual field were full on confrontational test. Facial sensation and strength were normal. Head turning and shoulder shrug were normal and symmetric. Motor: Strength is intact, left upper extremity amputation at the elbow Sensory: Sensory testing is intact to soft touch on all 4 extremities. No evidence of extinction is noted.  Coordination:  Cerebellar testing reveals good finger-nose-finger with the left Gait and station: Gait is normal. Tandem gait  is normal. Romberg is negative. No drift is seen.  Reflexes: Deep tendon reflexes are symmetric and normal bilaterally.   DIAGNOSTIC DATA (LABS, IMAGING, TESTING) - I reviewed patient records, labs, notes, testing and imaging myself where available.  Lab Results  Component Value Date   WBC 6.5 12/15/2018   HGB 13.8 12/15/2018   HCT 40.7 12/15/2018   MCV 97 12/15/2018   PLT 222 12/15/2018      Component Value Date/Time   NA 132 (L) 12/22/2018 1436   K 4.5 12/22/2018 1436   CL 99 12/22/2018 1436   CO2 20 12/22/2018 1436   GLUCOSE 91 12/22/2018 1436   GLUCOSE 106 (H) 11/08/2008 1130   BUN 13 12/22/2018 1436   CREATININE 0.76 12/22/2018 1436   CALCIUM 9.0 12/22/2018 1436   PROT 7.1 12/15/2018 1520   ALBUMIN 5.0 (H) 12/15/2018 1520   AST 14 12/15/2018 1520   ALT 22 12/15/2018 1520   ALKPHOS 56 12/15/2018 1520   BILITOT 0.7 12/15/2018 1520   GFRNONAA 79 12/22/2018 1436   GFRAA 91 12/22/2018 1436   No results found for: CHOL, HDL, LDLCALC, LDLDIRECT, TRIG, CHOLHDL Lab Results  Component Value Date   HGBA1C 5.5 12/15/2018   Lab Results  Component Value Date   VITAMINB12 >2000 (H) 12/15/2018   Lab Results  Component Value Date   TSH 1.120 12/15/2018   ASSESSMENT AND PLAN 74 y.o. year old female  has a past medical history of Dysphasia, Endometrial cancer ([place]), Heel spur, Heparin induced thrombocytopenia ([place]), Hypercholesteremia, Hypertension, Hypothyroidism, Migraine headache, Osteopenia, RLS (restless legs syndrome), SVC syndrome, and Thyroid disease. here with:  1.  Restless leg syndrome 2.  Chronic insomnia -EMG nerve conduction showed evidence of mild length dependent axonal peripheral neuropathy, moderate right carpal tunnel syndrome -Labs have shown hyponatremia, followed by PCP -Requip lost benefit, caused nausea, gabapentin caused dizziness, clonazepam  wore off -Continue Lyrica 100 mg, 2 capsules at bedtime, may take an extra if needed, Lyrica has been the most effective, to help sleep as well, refill Lyrica sent in, last fill on 03/26/20 # 90, sent for 3 refills, earliest fill 12/1 -Follow-up in 6 months or sooner if needed  I spent 20 minutes of face-to-face and non-face-to-face time with patient.  This included previsit chart review, lab review, study review, order entry, electronic health record documentation, patient education.  Butler Denmark, AGNP-C, DNP 04/03/2020, 2:44 PM [facility] [address], [place], [place] [address] [phone]

## 2020-04-15 NOTE — Progress Notes (Signed)
I have reviewed and agreed above plan. 

## 2020-04-23 DIAGNOSIS — I10 Essential (primary) hypertension: Secondary | ICD-10-CM | POA: Diagnosis not present

## 2020-05-23 DIAGNOSIS — I10 Essential (primary) hypertension: Secondary | ICD-10-CM | POA: Diagnosis not present

## 2020-06-20 DIAGNOSIS — G43719 Chronic migraine without aura, intractable, without status migrainosus: Secondary | ICD-10-CM | POA: Diagnosis not present

## 2020-06-20 DIAGNOSIS — G43019 Migraine without aura, intractable, without status migrainosus: Secondary | ICD-10-CM | POA: Diagnosis not present

## 2020-06-24 DIAGNOSIS — I10 Essential (primary) hypertension: Secondary | ICD-10-CM | POA: Diagnosis not present

## 2020-07-22 DIAGNOSIS — I10 Essential (primary) hypertension: Secondary | ICD-10-CM | POA: Diagnosis not present

## 2020-08-22 DIAGNOSIS — I10 Essential (primary) hypertension: Secondary | ICD-10-CM | POA: Diagnosis not present

## 2020-08-26 ENCOUNTER — Other Ambulatory Visit: Payer: Self-pay | Admitting: Neurology

## 2020-08-26 NOTE — Telephone Encounter (Signed)
Lyrica 100mg  #90 last filled on 07/24/20. Pt last OV with Judson Roch, NP on 04/03/20 and next OV is scheduled for 10/02/20.

## 2020-09-20 DIAGNOSIS — I10 Essential (primary) hypertension: Secondary | ICD-10-CM | POA: Diagnosis not present

## 2020-10-02 ENCOUNTER — Encounter: Payer: Self-pay | Admitting: Neurology

## 2020-10-02 ENCOUNTER — Other Ambulatory Visit: Payer: Self-pay

## 2020-10-02 ENCOUNTER — Ambulatory Visit: Payer: Medicare PPO | Admitting: Neurology

## 2020-10-02 VITALS — BP 169/81 | HR 60 | Ht 64.0 in | Wt 146.0 lb

## 2020-10-02 DIAGNOSIS — G2581 Restless legs syndrome: Secondary | ICD-10-CM | POA: Diagnosis not present

## 2020-10-02 MED ORDER — NEUPRO 1 MG/24HR TD PT24: 1.0000 mg | MEDICATED_PATCH | Freq: Every day | TRANSDERMAL | 3 refills | Status: DC

## 2020-10-02 NOTE — Progress Notes (Signed)
PATIENT: [name] DOB: Nov 04, 1945  REASON FOR VISIT: follow up HISTORY FROM: patient  HISTORY OF PRESENT ILLNESS: Today 10/02/20  HISTORY  [name] a 75 year old female, seen in request by her primary care physician Dr. Woody Seller, Rennis Petty B, for evaluation of restless leg, chronic insomnia, she is accompanied by her husband at today's visit on December 15, 2018.  I have reviewed and summarized the referring note from the referring physician. 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  She walks regularly, wearing 1 pound ankle weight,  She complains recent onset bilateral toes paresthesia  Update February 15, 2019 She return for electrodiagnostic study today, which showed evidence of mild axonal sensorimotor polyneuropathy, and moderate right carpal tunnel syndrome.  Her sleep is much improved with Lyrica 25 mg 4 tablets every night, but she still get up occasionally pacing around walking 500 steps, read books before she can go back to sleep again  Previously she has tried melatonin which did help her sleep  I have suggested her  increase Lyrica to 75 mg 2 tablets every night, may take extra 1 if she needed, may mix with melatonin 1 mg every night for her restless leg symptoms  Laboratory evaluations showed hyponatremia with sodium level of 127, repeat test showed 133, normal CBC, vitamin D 44, ANA, copper level, ferritin was mildlyelevated 185, protein electrophoresis showed no significant abnormality,A1c was 5.5, normal ESR, CPK, P59, RPR, folic acid, C-reactive protein, TSH,  Update December 17, 2020SS:Ms. Ewingpresents for follow-up accompanied by her husband. She is taking Lyrica 75 mg tablet, 1 at 10 pm, 2 at 11:15.She indicates her sleep, andrestless leg symptoms are under much better control, with Lyrica. She says her symptoms are 80% better. She is now sleeping 5 to 6 hours continuously. She says she sometimes wears earplugs at night. She is tolerating medication without side effect. She does follow-up with her primary doctor, to discusshyponatremia.   Update Sep 28, 2019 SS: Here today for follow-up accompanied by her husband, complains of Lyrica wearing off, having more symptoms of RLS, trouble sleeping.  Is currently taking Lyrica 75 mg, 3 at bedtime, she walks 2 miles a day, walks 2000 steps before going to bed to make herself tired, she falls asleep around 11, then, up during the night, walking around.  Feels constant urge to move her feet, want to be in constant motion.  Has had this problem since her 60s, has briefly tried gabapentin, only 1 tablet, cause dizziness, clonazepam, Requip.  She is gradually tapering off Zonegran for headache, and insomnia. Wants to try higher dose of Lyrica.   Update April 03, 2020 SS: Here today with her husband, currently taking  Lyrica 100 mg, 3 capsules at 8:00 pm, then going to bed at 11:30.  She does activities on her [place] before bed, walks at least 1500 steps to make sure her legs are tired.  Lyrica has been most effective, will sleep for 4 hours, and wake up  to use the bathroom, usually go back to sleep for another 1 to 2 hours.  This is good for her.  Tapering off Zonegran, currently at 25 mg daily for headache.  No headache in 1-2 years.  Tolerating Lyrica well.  Update Oct 02, 2020 SS: Here today accompanied by her husband, currently taking Lyrica 300 mg around 8 PM, goes to bed at 1130.  Was working well, up until the last month, is having at least 1 night a week where she does not go to sleep till 3 AM, has to walk around due to RLS symptoms.  Reports symptoms start in mid afternoon, continue to the evening.  Is off Zonegran for headache.  Has previously tried Requip, clonazepam, gabapentin. Claims pattern has been for medications to wear off after about 1 year.   REVIEW OF SYSTEMS: Out of a complete 14 system review of symptoms, the patient complains only of the following symptoms, and all other reviewed systems are negative.  Restless legs  ALLERGIES: Allergies  Allergen Reactions  . Iodinated Diagnostic Agents Hives and Rash  . Atorvastatin Calcium Other (See Comments)    Musculoskeletal aches Other reaction(s): Other (See Comments) Musculoskeletal aches  . Doxycycline Other (See Comments)    Liver problems Other reaction(s): Other (See Comments) Liver problems  . Heparin Other (See Comments)    Causes blood clots. Other reaction(s): Other (See Comments) Causes blood clots.  . Lisinopril Other (See Comments)    cough Other reaction(s): Other (See Comments) cough  . Lovastatin Diarrhea  . Ropinirole Hcl Nausea Only    HOME MEDICATIONS: Outpatient Medications Prior to Visit  Medication Sig Dispense Refill  . ACETAMINOPHEN PO Take 325 mg by mouth. One tablet BID    . Ascorbic Acid (VITAMIN C) 1000 MG tablet Take 1,000 mg by mouth 2 (two) times a day.    . b complex vitamins tablet Take 2 tablets by mouth daily.    . Biotin 10000 MCG TABS Take 1 tablet by mouth 3 (three) times daily.    . Coenzyme Q10 (CO Q 10) 100 MG CAPS Take  1 capsule by mouth daily.    Marland Kitchen diltiazem (CARDIZEM CD) 180 MG 24 hr capsule Take 180 mg by mouth daily.    Marland Kitchen levothyroxine (SYNTHROID) 75 MCG tablet Take 75 mcg by mouth daily before breakfast.    . Magnesium Malate 1250 (141.7 Mg) MG TABS Take 1,250 mg by mouth. Two tablets daily.    . Potassium 99 MG TABS Take 1 tablet by mouth 2 (two) times a day.    . pregabalin (LYRICA) 100 MG capsule TAKE 2 CAPSULES AT BEDTIME. MAY TAKE AN EXTRA ONE IF NEEDED. 90 capsule 1  . Probiotic Product (ALIGN PO) Take 1 tablet by mouth daily.    Marland Kitchen UNABLE TO FIND Med Name: Lidocaine Hydrochloride 4% nasal spray    . UNABLE TO FIND Take 500 mg by mouth 2 (two) times a day. Med Name: Beet Root    . zonisamide (ZONEGRAN) 25 MG capsule Take 25 mg by mouth daily.     No facility-administered medications prior to visit.    PAST MEDICAL HISTORY: Past Medical History:  Diagnosis Date  . Dysphasia   .  Endometrial cancer ([place])   . Heel spur   . 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 Hypercholesteremia   . Hypertension   . Hypothyroidism   . Migraine headache   . Osteopenia   . RLS (restless legs syndrome)   . SVC syndrome   . Thyroid disease     PAST SURGICAL HISTORY: Past Surgical History:  Procedure Laterality Date  . ABDOMINAL HYSTERECTOMY    . ANKLE SURGERY Right   . 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 FINGER AMPUTATION     right index finger at knuckle  . PORT-A-CATH REMOVAL    . TONSILLECTOMY      FAMILY HISTORY: Family History  Problem Relation Age of Onset  . Stroke Mother   . Transient ischemic attack Father   . Dementia Father   . Pneumonia Father   . Diabetes Sister   . Rheum arthritis Sister   . Brain cancer Brother   . Breast cancer Maternal Grandmother   . Stroke Paternal Grandmother     SOCIAL HISTORY: Social History   Socioeconomic History  . Marital status: Married    Spouse name: Not on file  . Number of children: 3  . Years of education: college  . Highest  education level: Not on file  Occupational History  . Occupation: Retired Pharmacist, hospital  Tobacco Use  . Smoking status: Never Smoker  . Smokeless tobacco: Never Used  Substance and Sexual Activity  . Alcohol use: No  . Drug use: No  . Sexual activity: Not on file  Other Topics Concern  . Not on file  Social History Narrative   Lives at home with husband.   Right-handed.   No caffeine use.   Social Determinants of Health   Financial Resource Strain: Not on file  Food Insecurity: Not on file  Transportation Needs: Not on file  Physical Activity: Not on file  Stress: Not on file  Social Connections: Not on file  Intimate Partner Violence: Not on file   PHYSICAL EXAM  Vitals:   10/02/20 1445  BP: (!) 169/81  Pulse: 60  Weight: 146 lb (66.2 kg)  Height: $Remove'5\' 4"'SUVZQwu$  (1.626 m)   Body mass index is 25.06 kg/m.  Generalized: Well developed, in no acute distress   Neurological examination  Mentation: Alert oriented to time, place, history taking. Follows all commands speech and language fluent Cranial nerve II-XII: Pupils were equal round reactive to light. Extraocular movements were full, visual field were full on confrontational test. Facial sensation and strength were normal. Head turning and shoulder shrug were normal and symmetric. Motor: Strength is intact, left upper extremity amputation at the elbow Sensory: Sensory testing is intact to soft touch on all 4 extremities. No evidence of extinction is noted.  Coordination: Cerebellar testing reveals good finger-nose-finger with the left Gait and station: Gait is normal. Reflexes: Deep tendon reflexes are symmetric and normal bilaterally.   DIAGNOSTIC DATA (LABS, IMAGING, TESTING) - I reviewed patient records, labs, notes, testing and imaging myself where available.  Lab Results  Component Value Date   WBC 6.5 12/15/2018   HGB 13.8 12/15/2018   HCT 40.7 12/15/2018   MCV 97 12/15/2018   PLT 222 12/15/2018      Component  Value Date/Time   NA 132 (L) 12/22/2018 1436   K 4.5 12/22/2018 1436   CL 99 12/22/2018 1436   CO2 20 12/22/2018 1436   GLUCOSE 91 12/22/2018 1436   GLUCOSE 106 (H) 11/08/2008 1130  BUN 13 12/22/2018 1436   CREATININE 0.76 12/22/2018 1436   CALCIUM 9.0 12/22/2018 1436   PROT 7.1 12/15/2018 1520   ALBUMIN 5.0 (H) 12/15/2018 1520   AST 14 12/15/2018 1520   ALT 22 12/15/2018 1520   ALKPHOS 56 12/15/2018 1520   BILITOT 0.7 12/15/2018 1520   GFRNONAA 79 12/22/2018 1436   GFRAA 91 12/22/2018 1436   No results found for: CHOL, HDL, LDLCALC, LDLDIRECT, TRIG, CHOLHDL Lab Results  Component Value Date   HGBA1C 5.5 12/15/2018   Lab Results  Component Value Date   VITAMINB12 >2000 (H) 12/15/2018   Lab Results  Component Value Date   TSH 1.120 12/15/2018   ASSESSMENT AND PLAN 75 y.o. year old female  has a past medical history of Dysphasia, Endometrial cancer ([place]), Heel spur, Heparin induced thrombocytopenia ([place]), Hypercholesteremia, Hypertension, Hypothyroidism, Migraine headache, Osteopenia, RLS (restless legs syndrome), SVC syndrome, and Thyroid disease. here with:  1.  Restless leg syndrome 2.  Chronic insomnia -Add-on Neupro patch 1 mg daily to help with RLS symptoms -We will continue Lyrica 300 mg in the evening, I don't want to increase single dose any further -Other options may be Sinemet -EMG nerve conduction showed evidence of mild length dependent axonal peripheral neuropathy, moderate right carpal tunnel syndrome -Labs have shown hyponatremia, followed by PCP; ferritin level was 185 in July 2020 -Requip lost benefit, caused nausea, gabapentin caused dizziness, clonazepam wore off -Follow-up in 6 months or sooner if needed  Butler Denmark, AGNP-C, DNP 10/02/2020, 3:20 PM [facility] [address] [place], [place] [address] [phone]

## 2020-10-02 NOTE — Patient Instructions (Addendum)
Try Neupro patch 1 mg daily for restless leg symptoms Continue the Lyrica  Call for dose adjustment  See you back in 6 months   Rotigotine Transdermal Skin Patch What is this medicine? ROTIGOTINE (roe TIG [place]) is used to treat symptoms of Parkinson's disease. It can also be used to treat restless legs syndrome. This medicine may be used for other purposes; ask your health care provider or pharmacist if you have questions. COMMON BRAND NAME(S): Neupro What should I tell my health care provider before I take this medicine? They need to know if you have any of these conditions:  heart disease  high blood pressure  low blood pressure  kidney disease  mental illness  narcolepsy  sleep apnea  an unusual or allergic reaction to rotigotine, sulfites, other medicines, foods, dyes, or preservatives  pregnant or trying to get pregnant  breast-feeding How should I use this medicine? This medicine is for external use only. Use it as directed on the prescription label. Change the patch each day at the same time. Always remove the old patch before you apply a new one. Wash hands after removing and applying this medicine. Keep using it unless your health care provider tells you to stop. This medicine comes with INSTRUCTIONS FOR USE. Ask your pharmacist for directions on how to use this medicine. Read the information carefully. Talk to your pharmacist or health care provider if you have questions. Talk to your health care provider about the use of this medicine in children. Special care may be needed. Overdosage: If you think you have taken too much of this medicine contact a poison control center or emergency room at once. NOTE: This medicine is only for you. Do not share this medicine with others. What if I miss a dose? If you miss a dose, take it as soon as you can. If it is almost time for your next dose, take only that dose. Do not take double or extra doses. What may interact with  this medicine?  alcohol  antihistamines for allergy, cough and cold  certain medicines for sleep  medicines for depression, anxiety, or psychotic disturbances  metoclopramide  narcotic medicines for pain This list may not describe all possible interactions. Give your health care provider a list of all the medicines, herbs, non-prescription drugs, or dietary supplements you use. Also tell them if you smoke, drink alcohol, or use illegal drugs. Some items may interact with your medicine. What should I watch for while using this medicine? Visit your health care provider for regular checks on your progress. Tell your health care provider if your symptoms do not start to get better or if they get worse. Do not suddenly stop taking this medicine. You may develop a severe reaction. Your health care provider will tell you how much medicine to take. If your health care provider wants you to stop the medicine, the dose may be slowly lowered over time to avoid any side effects. You may get drowsy or dizzy. Do not drive, use machinery, or do anything that needs mental alertness until you know how this medicine affects you. Do not stand or sit up quickly, especially if you are an older patient. This reduces the risk of dizzy or fainting spells. Alcohol may interfere with the effect of this medicine. Avoid alcoholic drinks. When taking this medicine, you may fall asleep without notice. You may be doing activities like driving a car, talking, or eating. You may not feel drowsy before it happens.  Contact your health care provider right away if this happens to you. There have been reports of increased sexual urges or other strong urges such as gambling while taking this medicine. If you experience any of these while taking this medicine, you should report this to your health care provider as soon as possible. This medicine patch is sensitive to certain body heat changes. If your skin gets too hot, more medicine  will come out of the patch. Call your health care provider if you get a fever. Do not take hot baths. Do not sunbathe. Do not use hot tubs, saunas, hairdryers, heating pads, electric blankets, heated waterbeds, or tanning lamps. Do not do exercise that increases your body temperature. If you are going to have a magnetic resonance imaging (MRI) procedure, tell your MRI technician if you have this patch on your body. It must be removed before an MRI. What side effects may I notice from receiving this medicine? Side effects that you should report to your doctor or health care professional as soon as possible:  allergic reactions like skin rash, itching or hives, swelling of the face, lips, or tongue  changes in emotions or moods  confusion  depressed mood  dizziness  falling asleep during normal activities like driving  fast, irregular, or slow heartbeat  feeling faint or lightheaded, falls  hallucinations  new or increased gambling urges, sexual urges, uncontrolled spending, binge or compulsive eating, or other urges  skin irritation, redness, or swelling  trouble breathing  uncontrollable movements of the arms, face, head, mouth, neck, or upper body Side effects that usually do not require medical attention (report to your doctor or health care professional if they continue or are bothersome):  constipation  headache  loss of appetite  nausea  trouble sleeping  weight gain This list may not describe all possible side effects. Call your doctor for medical advice about side effects. You may report side effects to FDA at 1-800-FDA-1088. Where should I keep my medicine? Keep out of the reach of children and pets. Store at room temperature between 20 and 25 degrees C (68 and 77 degrees F). Store in original pouch until just before use. Get rid of any unused medicine after the expiration date. To get rid of medicines that are no longer needed or have expired:  Take the  medicine to a medicine take-back program. Check with your pharmacy or law enforcement to find a location.  If you cannot return the medicine, ask your pharmacist or health care provider how to get rid of this medicine safely. NOTE: This sheet is a summary. It may not cover all possible information. If you have questions about this medicine, talk to your doctor, pharmacist, or health care provider.  2021 Elsevier/Gold Standard (2019-12-07 09:53:21)

## 2020-10-08 DIAGNOSIS — Z1231 Encounter for screening mammogram for malignant neoplasm of breast: Secondary | ICD-10-CM | POA: Diagnosis not present

## 2020-10-21 ENCOUNTER — Other Ambulatory Visit: Payer: Self-pay | Admitting: Neurology

## 2020-10-22 DIAGNOSIS — I10 Essential (primary) hypertension: Secondary | ICD-10-CM | POA: Diagnosis not present

## 2020-11-21 DIAGNOSIS — I10 Essential (primary) hypertension: Secondary | ICD-10-CM | POA: Diagnosis not present

## 2020-11-29 DIAGNOSIS — Z713 Dietary counseling and surveillance: Secondary | ICD-10-CM | POA: Diagnosis not present

## 2020-11-29 DIAGNOSIS — I10 Essential (primary) hypertension: Secondary | ICD-10-CM | POA: Diagnosis not present

## 2020-11-29 DIAGNOSIS — R42 Dizziness and giddiness: Secondary | ICD-10-CM | POA: Diagnosis not present

## 2020-11-29 DIAGNOSIS — Z299 Encounter for prophylactic measures, unspecified: Secondary | ICD-10-CM | POA: Diagnosis not present

## 2020-11-29 DIAGNOSIS — Z6823 Body mass index (BMI) 23.0-23.9, adult: Secondary | ICD-10-CM | POA: Diagnosis not present

## 2020-12-09 DIAGNOSIS — R42 Dizziness and giddiness: Secondary | ICD-10-CM | POA: Diagnosis not present

## 2020-12-19 ENCOUNTER — Other Ambulatory Visit: Payer: Self-pay | Admitting: Neurology

## 2020-12-20 DIAGNOSIS — Z7189 Other specified counseling: Secondary | ICD-10-CM | POA: Diagnosis not present

## 2020-12-20 DIAGNOSIS — Z0000 Encounter for general adult medical examination without abnormal findings: Secondary | ICD-10-CM | POA: Diagnosis not present

## 2020-12-20 DIAGNOSIS — Z6823 Body mass index (BMI) 23.0-23.9, adult: Secondary | ICD-10-CM | POA: Diagnosis not present

## 2020-12-20 DIAGNOSIS — Z299 Encounter for prophylactic measures, unspecified: Secondary | ICD-10-CM | POA: Diagnosis not present

## 2020-12-20 DIAGNOSIS — Z1331 Encounter for screening for depression: Secondary | ICD-10-CM | POA: Diagnosis not present

## 2020-12-20 DIAGNOSIS — E039 Hypothyroidism, unspecified: Secondary | ICD-10-CM | POA: Diagnosis not present

## 2020-12-20 DIAGNOSIS — R5383 Other fatigue: Secondary | ICD-10-CM | POA: Diagnosis not present

## 2020-12-20 DIAGNOSIS — I10 Essential (primary) hypertension: Secondary | ICD-10-CM | POA: Diagnosis not present

## 2020-12-20 DIAGNOSIS — E7800 Pure hypercholesterolemia, unspecified: Secondary | ICD-10-CM | POA: Diagnosis not present

## 2020-12-20 DIAGNOSIS — Z79899 Other long term (current) drug therapy: Secondary | ICD-10-CM | POA: Diagnosis not present

## 2020-12-20 DIAGNOSIS — Z1339 Encounter for screening examination for other mental health and behavioral disorders: Secondary | ICD-10-CM | POA: Diagnosis not present

## 2020-12-26 ENCOUNTER — Other Ambulatory Visit: Payer: Self-pay | Admitting: *Deleted

## 2020-12-26 MED ORDER — NEUPRO 1 MG/24HR TD PT24: 1.0000 mg | MEDICATED_PATCH | Freq: Every day | TRANSDERMAL | 0 refills | Status: DC

## 2021-01-13 ENCOUNTER — Other Ambulatory Visit: Payer: Self-pay | Admitting: Neurology

## 2021-01-13 ENCOUNTER — Other Ambulatory Visit: Payer: Self-pay | Admitting: Emergency Medicine

## 2021-01-14 ENCOUNTER — Other Ambulatory Visit: Payer: Self-pay | Admitting: Emergency Medicine

## 2021-01-15 ENCOUNTER — Telehealth: Payer: Self-pay | Admitting: Neurology

## 2021-01-15 ENCOUNTER — Other Ambulatory Visit: Payer: Self-pay | Admitting: Neurology

## 2021-01-15 NOTE — Telephone Encounter (Signed)
Returned patient's call.  She said [name] pharmacy told her that they had not received the refill from the [facility].   I called [facility] and they had received it but had some glitch, was able to override it.  Patient has 30 day prescription with 5 refills available at [facility].

## 2021-01-15 NOTE — Telephone Encounter (Signed)
Pt called needing a refill on her pregabalin (LYRICA) 100 MG capsule sent to the [place]

## 2021-01-22 DIAGNOSIS — I10 Essential (primary) hypertension: Secondary | ICD-10-CM | POA: Diagnosis not present

## 2021-01-29 LAB — COLOGUARD

## 2021-02-05 NOTE — Progress Notes (Signed)
Chart reviewed, agree above plan ?

## 2021-02-07 DIAGNOSIS — Z299 Encounter for prophylactic measures, unspecified: Secondary | ICD-10-CM | POA: Diagnosis not present

## 2021-02-07 DIAGNOSIS — I10 Essential (primary) hypertension: Secondary | ICD-10-CM | POA: Diagnosis not present

## 2021-02-19 LAB — COLOGUARD

## 2021-02-21 DIAGNOSIS — I10 Essential (primary) hypertension: Secondary | ICD-10-CM | POA: Diagnosis not present

## 2021-02-28 ENCOUNTER — Other Ambulatory Visit: Payer: Self-pay | Admitting: Neurology

## 2021-02-28 DIAGNOSIS — Z823 Family history of stroke: Secondary | ICD-10-CM | POA: Diagnosis not present

## 2021-02-28 DIAGNOSIS — M1990 Unspecified osteoarthritis, unspecified site: Secondary | ICD-10-CM | POA: Diagnosis not present

## 2021-02-28 DIAGNOSIS — G8929 Other chronic pain: Secondary | ICD-10-CM | POA: Diagnosis not present

## 2021-02-28 DIAGNOSIS — E039 Hypothyroidism, unspecified: Secondary | ICD-10-CM | POA: Diagnosis not present

## 2021-02-28 DIAGNOSIS — E785 Hyperlipidemia, unspecified: Secondary | ICD-10-CM | POA: Diagnosis not present

## 2021-02-28 DIAGNOSIS — Z809 Family history of malignant neoplasm, unspecified: Secondary | ICD-10-CM | POA: Diagnosis not present

## 2021-02-28 DIAGNOSIS — M810 Age-related osteoporosis without current pathological fracture: Secondary | ICD-10-CM | POA: Diagnosis not present

## 2021-02-28 DIAGNOSIS — I10 Essential (primary) hypertension: Secondary | ICD-10-CM | POA: Diagnosis not present

## 2021-02-28 DIAGNOSIS — G2581 Restless legs syndrome: Secondary | ICD-10-CM | POA: Diagnosis not present

## 2021-03-24 DIAGNOSIS — I10 Essential (primary) hypertension: Secondary | ICD-10-CM | POA: Diagnosis not present

## 2021-04-21 ENCOUNTER — Ambulatory Visit: Payer: Medicare PPO | Admitting: Neurology

## 2021-04-23 DIAGNOSIS — I10 Essential (primary) hypertension: Secondary | ICD-10-CM | POA: Diagnosis not present

## 2021-05-23 DIAGNOSIS — I10 Essential (primary) hypertension: Secondary | ICD-10-CM | POA: Diagnosis not present

## 2021-07-12 DIAGNOSIS — N3000 Acute cystitis without hematuria: Secondary | ICD-10-CM | POA: Insufficient documentation

## 2021-07-12 DIAGNOSIS — R410 Disorientation, unspecified: Secondary | ICD-10-CM | POA: Diagnosis not present

## 2021-07-12 DIAGNOSIS — R4182 Altered mental status, unspecified: Secondary | ICD-10-CM | POA: Insufficient documentation

## 2021-07-12 DIAGNOSIS — R531 Weakness: Secondary | ICD-10-CM | POA: Diagnosis not present

## 2021-07-12 DIAGNOSIS — E871 Hypo-osmolality and hyponatremia: Secondary | ICD-10-CM | POA: Diagnosis not present

## 2021-07-12 DIAGNOSIS — Z79899 Other long term (current) drug therapy: Secondary | ICD-10-CM | POA: Diagnosis not present

## 2021-07-12 DIAGNOSIS — R6889 Other general symptoms and signs: Secondary | ICD-10-CM | POA: Diagnosis not present

## 2021-07-12 DIAGNOSIS — Z20822 Contact with and (suspected) exposure to covid-19: Secondary | ICD-10-CM | POA: Diagnosis not present

## 2021-07-12 DIAGNOSIS — Z743 Need for continuous supervision: Secondary | ICD-10-CM | POA: Diagnosis not present

## 2021-07-12 DIAGNOSIS — E876 Hypokalemia: Secondary | ICD-10-CM | POA: Insufficient documentation

## 2021-07-12 DIAGNOSIS — E878 Other disorders of electrolyte and fluid balance, not elsewhere classified: Secondary | ICD-10-CM | POA: Diagnosis not present

## 2021-07-12 DIAGNOSIS — I6782 Cerebral ischemia: Secondary | ICD-10-CM | POA: Diagnosis not present

## 2021-07-12 DIAGNOSIS — Z792 Long term (current) use of antibiotics: Secondary | ICD-10-CM | POA: Diagnosis not present

## 2021-07-12 DIAGNOSIS — J320 Chronic maxillary sinusitis: Secondary | ICD-10-CM | POA: Diagnosis not present

## 2021-07-12 DIAGNOSIS — R404 Transient alteration of awareness: Secondary | ICD-10-CM | POA: Diagnosis not present

## 2021-07-13 DIAGNOSIS — E876 Hypokalemia: Secondary | ICD-10-CM | POA: Diagnosis not present

## 2021-07-13 DIAGNOSIS — N3000 Acute cystitis without hematuria: Secondary | ICD-10-CM | POA: Diagnosis not present

## 2021-07-13 DIAGNOSIS — Z792 Long term (current) use of antibiotics: Secondary | ICD-10-CM | POA: Diagnosis not present

## 2021-07-13 DIAGNOSIS — E878 Other disorders of electrolyte and fluid balance, not elsewhere classified: Secondary | ICD-10-CM | POA: Diagnosis not present

## 2021-07-13 DIAGNOSIS — E871 Hypo-osmolality and hyponatremia: Secondary | ICD-10-CM | POA: Diagnosis not present

## 2021-07-13 DIAGNOSIS — R531 Weakness: Secondary | ICD-10-CM | POA: Diagnosis not present

## 2021-07-13 DIAGNOSIS — R4182 Altered mental status, unspecified: Secondary | ICD-10-CM | POA: Diagnosis not present

## 2021-07-14 DIAGNOSIS — E876 Hypokalemia: Secondary | ICD-10-CM | POA: Diagnosis not present

## 2021-07-14 DIAGNOSIS — E871 Hypo-osmolality and hyponatremia: Secondary | ICD-10-CM | POA: Diagnosis not present

## 2021-07-14 DIAGNOSIS — N3000 Acute cystitis without hematuria: Secondary | ICD-10-CM | POA: Diagnosis not present

## 2021-07-14 DIAGNOSIS — R531 Weakness: Secondary | ICD-10-CM | POA: Diagnosis not present

## 2021-07-15 DIAGNOSIS — E871 Hypo-osmolality and hyponatremia: Secondary | ICD-10-CM | POA: Diagnosis not present

## 2021-07-15 DIAGNOSIS — N3000 Acute cystitis without hematuria: Secondary | ICD-10-CM | POA: Diagnosis not present

## 2021-07-15 DIAGNOSIS — E876 Hypokalemia: Secondary | ICD-10-CM | POA: Diagnosis not present

## 2021-07-15 DIAGNOSIS — R531 Weakness: Secondary | ICD-10-CM | POA: Diagnosis not present

## 2021-07-19 ENCOUNTER — Other Ambulatory Visit: Payer: Self-pay | Admitting: Neurology

## 2021-07-19 DIAGNOSIS — R202 Paresthesia of skin: Secondary | ICD-10-CM

## 2021-07-19 DIAGNOSIS — G2581 Restless legs syndrome: Secondary | ICD-10-CM

## 2021-07-21 NOTE — Telephone Encounter (Signed)
Controlled Drug Registry checked:  Lyrica 100mg  last filled on 06/18/21 for #90 (30 day supply)  Last visit: 10/02/2020 Next visit: 08/13/2021

## 2021-07-22 DIAGNOSIS — I10 Essential (primary) hypertension: Secondary | ICD-10-CM | POA: Diagnosis not present

## 2021-07-24 DIAGNOSIS — Z09 Encounter for follow-up examination after completed treatment for conditions other than malignant neoplasm: Secondary | ICD-10-CM | POA: Diagnosis not present

## 2021-07-24 DIAGNOSIS — Z299 Encounter for prophylactic measures, unspecified: Secondary | ICD-10-CM | POA: Diagnosis not present

## 2021-07-24 DIAGNOSIS — N390 Urinary tract infection, site not specified: Secondary | ICD-10-CM | POA: Diagnosis not present

## 2021-07-24 DIAGNOSIS — I10 Essential (primary) hypertension: Secondary | ICD-10-CM | POA: Diagnosis not present

## 2021-07-24 DIAGNOSIS — S58119A Complete traumatic amputation at level between elbow and wrist, unspecified arm, initial encounter: Secondary | ICD-10-CM | POA: Diagnosis not present

## 2021-07-30 DIAGNOSIS — Z20822 Contact with and (suspected) exposure to covid-19: Secondary | ICD-10-CM | POA: Diagnosis not present

## 2021-07-30 DIAGNOSIS — I10 Essential (primary) hypertension: Secondary | ICD-10-CM | POA: Diagnosis not present

## 2021-07-30 DIAGNOSIS — E871 Hypo-osmolality and hyponatremia: Secondary | ICD-10-CM | POA: Diagnosis not present

## 2021-07-30 DIAGNOSIS — I700 Atherosclerosis of aorta: Secondary | ICD-10-CM | POA: Diagnosis not present

## 2021-07-30 DIAGNOSIS — N3000 Acute cystitis without hematuria: Secondary | ICD-10-CM | POA: Diagnosis not present

## 2021-07-30 DIAGNOSIS — E876 Hypokalemia: Secondary | ICD-10-CM | POA: Diagnosis not present

## 2021-07-30 DIAGNOSIS — E079 Disorder of thyroid, unspecified: Secondary | ICD-10-CM | POA: Diagnosis not present

## 2021-07-30 DIAGNOSIS — Z87440 Personal history of urinary (tract) infections: Secondary | ICD-10-CM | POA: Diagnosis not present

## 2021-07-30 DIAGNOSIS — R410 Disorientation, unspecified: Secondary | ICD-10-CM | POA: Diagnosis not present

## 2021-07-30 DIAGNOSIS — E785 Hyperlipidemia, unspecified: Secondary | ICD-10-CM | POA: Diagnosis not present

## 2021-07-30 DIAGNOSIS — R531 Weakness: Secondary | ICD-10-CM | POA: Diagnosis not present

## 2021-07-30 DIAGNOSIS — R4182 Altered mental status, unspecified: Secondary | ICD-10-CM | POA: Diagnosis not present

## 2021-07-30 DIAGNOSIS — R079 Chest pain, unspecified: Secondary | ICD-10-CM | POA: Diagnosis not present

## 2021-07-30 DIAGNOSIS — G2581 Restless legs syndrome: Secondary | ICD-10-CM | POA: Diagnosis not present

## 2021-07-31 DIAGNOSIS — R531 Weakness: Secondary | ICD-10-CM | POA: Diagnosis not present

## 2021-07-31 DIAGNOSIS — E871 Hypo-osmolality and hyponatremia: Secondary | ICD-10-CM | POA: Diagnosis not present

## 2021-07-31 DIAGNOSIS — R410 Disorientation, unspecified: Secondary | ICD-10-CM | POA: Diagnosis not present

## 2021-07-31 DIAGNOSIS — Z87440 Personal history of urinary (tract) infections: Secondary | ICD-10-CM | POA: Diagnosis not present

## 2021-08-01 DIAGNOSIS — Z87440 Personal history of urinary (tract) infections: Secondary | ICD-10-CM | POA: Diagnosis not present

## 2021-08-01 DIAGNOSIS — R410 Disorientation, unspecified: Secondary | ICD-10-CM | POA: Diagnosis not present

## 2021-08-01 DIAGNOSIS — R531 Weakness: Secondary | ICD-10-CM | POA: Diagnosis not present

## 2021-08-01 DIAGNOSIS — E871 Hypo-osmolality and hyponatremia: Secondary | ICD-10-CM | POA: Diagnosis not present

## 2021-08-04 DIAGNOSIS — Z09 Encounter for follow-up examination after completed treatment for conditions other than malignant neoplasm: Secondary | ICD-10-CM | POA: Diagnosis not present

## 2021-08-04 DIAGNOSIS — I10 Essential (primary) hypertension: Secondary | ICD-10-CM | POA: Diagnosis not present

## 2021-08-04 DIAGNOSIS — N390 Urinary tract infection, site not specified: Secondary | ICD-10-CM | POA: Diagnosis not present

## 2021-08-04 DIAGNOSIS — E871 Hypo-osmolality and hyponatremia: Secondary | ICD-10-CM | POA: Diagnosis not present

## 2021-08-13 ENCOUNTER — Encounter: Payer: Self-pay | Admitting: Neurology

## 2021-08-13 ENCOUNTER — Ambulatory Visit: Payer: Medicare PPO | Admitting: Neurology

## 2021-08-13 DIAGNOSIS — I10 Essential (primary) hypertension: Secondary | ICD-10-CM | POA: Diagnosis not present

## 2021-08-13 DIAGNOSIS — M8580 Other specified disorders of bone density and structure, unspecified site: Secondary | ICD-10-CM | POA: Diagnosis not present

## 2021-08-13 DIAGNOSIS — R202 Paresthesia of skin: Secondary | ICD-10-CM

## 2021-08-13 DIAGNOSIS — G2581 Restless legs syndrome: Secondary | ICD-10-CM

## 2021-08-13 DIAGNOSIS — G8929 Other chronic pain: Secondary | ICD-10-CM | POA: Diagnosis not present

## 2021-08-13 DIAGNOSIS — N3000 Acute cystitis without hematuria: Secondary | ICD-10-CM | POA: Diagnosis not present

## 2021-08-13 MED ORDER — NEUPRO 1 MG/24HR TD PT24: 1.0000 mg | MEDICATED_PATCH | Freq: Every day | TRANSDERMAL | 3 refills | Status: DC

## 2021-08-13 MED ORDER — PREGABALIN 100 MG PO CAPS: ORAL_CAPSULE | ORAL | 3 refills | Status: DC

## 2021-08-13 NOTE — Progress Notes (Signed)
? ? ?PATIENT: [name] ?DOB: 12/18/1945 ? ?REASON FOR VISIT: follow up for RLS ?HISTORY FROM: patient, husband ?PRIMARY NEUROLOGIST: Dr. Krista Blue  ? ?HISTORY  ?[name] is a 76 year old female, seen in request by her primary care physician Dr. Woody Seller, Rennis Petty B, for evaluation of restless leg, chronic insomnia, she is accompanied by her husband at today's visit on December 15, 2018. ?  ?I have reviewed and summarized the referring note from the referring physician.  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  ?She report history of restless leg symptoms 20s, gradually getting worse, over the years, she has tried different medications, Requip in 2010 initially helped her, use for 1 year, later developed severe nausea, clonazepam since 2011, works at [employer], still taking 1.5 mg every night for sleep, gabapentin caused dizziness, she only tried 1 tablet ?  ?She complains of urge to move, difficult to hold still, especially at nighttime, she has to walk 1000 steps before she goes to bed, still has to toss around, moving her leg constantly, walking up multiple times. ?  ?She walks regularly, wearing 1 pound ankle weight, ?  ?She complains recent onset bilateral toes paresthesia ?  ?Update February 15, 2019 ?She return for electrodiagnostic study today, which showed evidence of mild axonal sensorimotor polyneuropathy, and moderate right carpal tunnel syndrome. ?  ?Her sleep is much improved with Lyrica 25 mg 4 tablets every night, but she still get up occasionally pacing around walking 500 steps, read books before she can go back to sleep again ?  ?Previously she has tried melatonin which did help her sleep ?  ?I have suggested her  increase Lyrica to 75 mg 2 tablets every night, may take extra 1 if she needed, may mix with melatonin 1 mg every night for her restless leg symptoms ?  ?Laboratory evaluations showed hyponatremia with sodium level of 127, repeat test showed 133, normal CBC, vitamin D 44, ANA, copper level, ferritin was mildly elevated 185, protein electrophoresis showed no significant abnormality, A1c was 5.5, normal ESR, CPK, Z61, RPR, folic acid, C-reactive protein, TSH, ?  ?Update May 11, 2019 SS: [name] presents for follow-up accompanied by her husband.  She is taking Lyrica 75 mg tablet, 1 at 10 pm, 2 at 11:15.  She indicates her sleep, and restless leg symptoms are under much better control, with Lyrica.  She says her symptoms are 80% better.  She is now sleeping 5 to 6 hours continuously.  She says she sometimes wears earplugs at night.  She is tolerating medication without side effect.  She does follow-up with her primary doctor, to discuss hyponatremia.   ?  ?Update Sep 28, 2019 SS: Here today for follow-up accompanied by her husband, complains of Lyrica wearing off, having more symptoms of RLS, trouble sleeping.  Is currently taking Lyrica 75 mg, 3 at bedtime, she walks 2 miles a day, walks 2000 steps before going to bed to make herself tired, she falls asleep around 11, then, up during the night, walking around.  Feels constant urge to move her feet, want to be in constant motion.  Has had this problem since her 70s, has briefly tried gabapentin, only 1 tablet, cause dizziness, clonazepam, Requip.  She is gradually tapering off Zonegran for headache, and insomnia. Wants to try higher dose of Lyrica.  ? ?Update April 03, 2020 SS: Here today with her husband, currently taking Lyrica 100 mg, 3 capsules at 8:00 pm, then going to bed at 11:30.  She does activities on her [place] before bed, walks at least 1500 steps to make sure her legs are tired.  Lyrica has been most effective, will sleep for 4 hours, and wake up  to use the bathroom, usually go back to sleep for another 1 to 2 hours.  This is good for her.  Tapering off Zonegran, currently at 25 mg daily for headache.  No headache in 1-2 years.  Tolerating Lyrica well. ? ?Update Oct 02, 2020 SS: Here today accompanied by her husband, currently taking Lyrica 300 mg around 8 PM, goes to bed at 1130.  Was working well, up until the last month, is having at least 1 night a week where she does not go to sleep till 3 AM, has to walk around due to RLS symptoms.  Reports symptoms start in mid afternoon, continue to the evening.  Is off Zonegran for headache.  Has previously tried Requip, clonazepam, gabapentin. Claims pattern has been for medications to wear off after about 1 year.  ? ?Update August 13, 2021 SS: Here today with husband. In hospital [facility] for 3 days July 30, 2021 for AMS, sodium level was 110, increased to 126 with NS, given salt tablets, her chlorthalidone stopped, had UTI was given Rocephin and Ceftin. Now feels good, back to baseline. Right now RLS are doing well, on Neupro 1 mg patch daily, takes Lyrica 300 mg at 7 PM. At the hospital didn't have Neupro patch, her symptoms worsened. Sodium will be rechecked next week at PCP. Tolerates medications well.  ? ?REVIEW OF SYSTEMS: Out of a complete 14 system review of symptoms, the patient complains only of the following symptoms, and all other reviewed systems are negative. ? ?See HPI ? ?ALLERGIES: ?Allergies  ?Allergen Reactions  ? Iodinated Contrast Media Hives and Rash  ? Atorvastatin Calcium Other (See Comments)  ?  Musculoskeletal aches ?Other reaction(s): Other (See Comments) ?Musculoskeletal aches  ? Doxycycline Other (See Comments)  ?  Liver problems ?Other reaction(s): Other (See Comments) ?Liver problems  ? Heparin Other (See Comments)  ?  Causes blood clots. ?Other reaction(s): Other (See Comments) ?Causes blood clots.  ? Lisinopril Other (See Comments)  ?  cough ?Other reaction(s): Other (See  Comments) ?cough  ? Lovastatin Diarrhea  ? Ropinirole Hcl Nausea Only  ? ? ?HOME MEDICATIONS: ?Outpatient Medications Prior to Visit  ?Medication Sig Dispense Refill  ? Ascorbic Acid (VITAMIN C) 1000 MG tablet Take 1,000 mg by mouth 2 (two) times a day.    ? b complex vitamins tablet Take 2 tablets by mouth daily.    ? diltiazem (CARDIZEM CD) 180 MG 24 hr capsule Take 180 mg by mouth daily.    ? levothyroxine (SYNTHROID) 75 MCG tablet Take 75 mcg by mouth daily before breakfast.    ? Magnesium Malate 1250 (141.7 Mg) MG TABS Take 1,250 mg by mouth. Two tablets daily.    ? Potassium 99 MG TABS Take 1 tablet by mouth 2 (two) times a day.    ? pregabalin (LYRICA) 100 MG capsule TAKE 2 CAPSULES AT BEDTIME. MAY TAKE AN EXTRA ONE IF NEEDED. 90 capsule 0  ? Probiotic Product (ALIGN PO) Take 1 tablet by mouth daily.    ?  UNABLE TO FIND Take 500 mg by mouth 2 (two) times a day. Med Name: Beet Root    ? NEUPRO 1 MG/24HR PT24 PLACE 1 PATCH (1 MG TOTAL) ONTO THE SKIN DAILY. 90 patch 0  ? ACETAMINOPHEN PO Take 325 mg by mouth. One tablet BID    ? Biotin 10000 MCG TABS Take 1 tablet by mouth 3 (three) times daily.    ? Coenzyme Q10 (CO Q 10) 100 MG CAPS Take 1 capsule by mouth daily.    ? UNABLE TO FIND Med Name: Lidocaine Hydrochloride 4% nasal spray    ? ?No facility-administered medications prior to visit.  ? ? ?PAST MEDICAL HISTORY: ?Past Medical History:  ?Diagnosis Date  ? Dysphasia   ? Endometrial cancer ([place])   ? Heel spur   ? Heparin induced thrombocytopenia   ? Hypercholesteremia   ? Hypertension   ? Hypothyroidism   ? Migraine headache   ? Osteopenia   ? RLS (restless legs syndrome)   ? SVC syndrome   ? Thyroid disease   ? ? ?PAST SURGICAL HISTORY: ?Past Surgical History:  ?Procedure Laterality Date  ? ABDOMINAL HYSTERECTOMY    ? ANKLE SURGERY Right   ? 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 FINGER AMPUTATION    ? right index finger at knuckle  ? PORT-A-CATH REMOVAL    ? TONSILLECTOMY    ? ? ?FAMILY HISTORY: ?Family History   ?Problem Relation Age of Onset  ? Stroke Mother   ? Transient ischemic attack Father   ? Dementia Father   ? Pneumonia Father   ? Diabetes Sister   ? Rheum arthritis Sister   ? Brain cancer Brother   ? Breast ca

## 2021-08-19 DIAGNOSIS — Z299 Encounter for prophylactic measures, unspecified: Secondary | ICD-10-CM | POA: Diagnosis not present

## 2021-08-19 DIAGNOSIS — E871 Hypo-osmolality and hyponatremia: Secondary | ICD-10-CM | POA: Diagnosis not present

## 2021-08-19 DIAGNOSIS — I10 Essential (primary) hypertension: Secondary | ICD-10-CM | POA: Diagnosis not present

## 2021-08-21 DIAGNOSIS — I10 Essential (primary) hypertension: Secondary | ICD-10-CM | POA: Diagnosis not present

## 2021-08-23 ENCOUNTER — Other Ambulatory Visit: Payer: Self-pay | Admitting: Neurology

## 2021-08-23 DIAGNOSIS — R202 Paresthesia of skin: Secondary | ICD-10-CM

## 2021-08-23 DIAGNOSIS — G2581 Restless legs syndrome: Secondary | ICD-10-CM

## 2021-08-27 NOTE — Progress Notes (Signed)
Chart reviewed, agree above plan ?

## 2021-09-21 DIAGNOSIS — I10 Essential (primary) hypertension: Secondary | ICD-10-CM | POA: Diagnosis not present

## 2021-09-27 ENCOUNTER — Emergency Department (HOSPITAL_COMMUNITY): Payer: Medicare PPO

## 2021-09-27 ENCOUNTER — Encounter (HOSPITAL_COMMUNITY): Payer: Self-pay | Admitting: Emergency Medicine

## 2021-09-27 ENCOUNTER — Other Ambulatory Visit: Payer: Self-pay

## 2021-09-27 ENCOUNTER — Inpatient Hospital Stay (HOSPITAL_COMMUNITY)
Admission: EM | Admit: 2021-09-27 | Discharge: 2021-10-02 | DRG: 193 | Disposition: A | Discharge status: Home Health | Payer: Medicare PPO | Attending: Internal Medicine | Admitting: Internal Medicine

## 2021-09-27 DIAGNOSIS — R846 Abnormal cytological findings in specimens from respiratory organs and thorax: Secondary | ICD-10-CM | POA: Diagnosis not present

## 2021-09-27 DIAGNOSIS — J849 Interstitial pulmonary disease, unspecified: Secondary | ICD-10-CM | POA: Diagnosis not present

## 2021-09-27 DIAGNOSIS — I10 Essential (primary) hypertension: Secondary | ICD-10-CM | POA: Diagnosis not present

## 2021-09-27 DIAGNOSIS — S065XAA Traumatic subdural hemorrhage with loss of consciousness status unknown, initial encounter: Secondary | ICD-10-CM | POA: Diagnosis present

## 2021-09-27 DIAGNOSIS — G2581 Restless legs syndrome: Secondary | ICD-10-CM | POA: Diagnosis present

## 2021-09-27 DIAGNOSIS — J189 Pneumonia, unspecified organism: Principal | ICD-10-CM | POA: Diagnosis present

## 2021-09-27 DIAGNOSIS — J9811 Atelectasis: Secondary | ICD-10-CM | POA: Diagnosis not present

## 2021-09-27 DIAGNOSIS — Z79890 Hormone replacement therapy (postmenopausal): Secondary | ICD-10-CM | POA: Diagnosis not present

## 2021-09-27 DIAGNOSIS — Z8542 Personal history of malignant neoplasm of other parts of uterus: Secondary | ICD-10-CM | POA: Diagnosis not present

## 2021-09-27 DIAGNOSIS — R0902 Hypoxemia: Secondary | ICD-10-CM | POA: Diagnosis present

## 2021-09-27 DIAGNOSIS — R6889 Other general symptoms and signs: Secondary | ICD-10-CM | POA: Diagnosis not present

## 2021-09-27 DIAGNOSIS — E871 Hypo-osmolality and hyponatremia: Secondary | ICD-10-CM | POA: Diagnosis present

## 2021-09-27 DIAGNOSIS — E039 Hypothyroidism, unspecified: Secondary | ICD-10-CM | POA: Diagnosis present

## 2021-09-27 DIAGNOSIS — Z823 Family history of stroke: Secondary | ICD-10-CM

## 2021-09-27 DIAGNOSIS — Z808 Family history of malignant neoplasm of other organs or systems: Secondary | ICD-10-CM

## 2021-09-27 DIAGNOSIS — Z833 Family history of diabetes mellitus: Secondary | ICD-10-CM

## 2021-09-27 DIAGNOSIS — I3139 Other pericardial effusion (noninflammatory): Secondary | ICD-10-CM | POA: Diagnosis not present

## 2021-09-27 DIAGNOSIS — Z743 Need for continuous supervision: Secondary | ICD-10-CM | POA: Diagnosis not present

## 2021-09-27 DIAGNOSIS — J9601 Acute respiratory failure with hypoxia: Secondary | ICD-10-CM | POA: Diagnosis not present

## 2021-09-27 DIAGNOSIS — Z90710 Acquired absence of both cervix and uterus: Secondary | ICD-10-CM

## 2021-09-27 DIAGNOSIS — Z803 Family history of malignant neoplasm of breast: Secondary | ICD-10-CM | POA: Diagnosis not present

## 2021-09-27 DIAGNOSIS — N179 Acute kidney failure, unspecified: Secondary | ICD-10-CM | POA: Diagnosis not present

## 2021-09-27 DIAGNOSIS — I6200 Nontraumatic subdural hemorrhage, unspecified: Secondary | ICD-10-CM | POA: Diagnosis not present

## 2021-09-27 DIAGNOSIS — R0602 Shortness of breath: Secondary | ICD-10-CM | POA: Diagnosis not present

## 2021-09-27 DIAGNOSIS — X58XXXA Exposure to other specified factors, initial encounter: Secondary | ICD-10-CM | POA: Diagnosis present

## 2021-09-27 DIAGNOSIS — Z91041 Radiographic dye allergy status: Secondary | ICD-10-CM

## 2021-09-27 DIAGNOSIS — R55 Syncope and collapse: Secondary | ICD-10-CM | POA: Diagnosis not present

## 2021-09-27 DIAGNOSIS — R404 Transient alteration of awareness: Secondary | ICD-10-CM | POA: Diagnosis not present

## 2021-09-27 DIAGNOSIS — M8580 Other specified disorders of bone density and structure, unspecified site: Secondary | ICD-10-CM | POA: Diagnosis present

## 2021-09-27 DIAGNOSIS — Z89212 Acquired absence of left upper limb below elbow: Secondary | ICD-10-CM

## 2021-09-27 DIAGNOSIS — E7800 Pure hypercholesterolemia, unspecified: Secondary | ICD-10-CM | POA: Diagnosis present

## 2021-09-27 DIAGNOSIS — J90 Pleural effusion, not elsewhere classified: Secondary | ICD-10-CM | POA: Diagnosis not present

## 2021-09-27 DIAGNOSIS — Z888 Allergy status to other drugs, medicaments and biological substances status: Secondary | ICD-10-CM

## 2021-09-27 DIAGNOSIS — E860 Dehydration: Secondary | ICD-10-CM | POA: Diagnosis present

## 2021-09-27 DIAGNOSIS — W19XXXA Unspecified fall, initial encounter: Secondary | ICD-10-CM | POA: Diagnosis not present

## 2021-09-27 DIAGNOSIS — Z79899 Other long term (current) drug therapy: Secondary | ICD-10-CM | POA: Diagnosis not present

## 2021-09-27 DIAGNOSIS — Z8261 Family history of arthritis: Secondary | ICD-10-CM

## 2021-09-27 DIAGNOSIS — R519 Headache, unspecified: Secondary | ICD-10-CM | POA: Diagnosis not present

## 2021-09-27 DIAGNOSIS — R4020 Unspecified coma: Secondary | ICD-10-CM | POA: Diagnosis not present

## 2021-09-27 LAB — URINALYSIS, MICROSCOPIC (REFLEX)

## 2021-09-27 LAB — BASIC METABOLIC PANEL
Anion gap: 6 (ref 5–15)
BUN: 32 mg/dL — ABNORMAL HIGH (ref 8–23)
CO2: 29 mmol/L (ref 22–32)
Calcium: 14.5 mg/dL (ref 8.9–10.3)
Chloride: 99 mmol/L (ref 98–111)
Creatinine, Ser: 2.28 mg/dL — ABNORMAL HIGH (ref 0.44–1.00)
GFR, Estimated: 22 mL/min — ABNORMAL LOW (ref >60)
Glucose, Bld: 119 mg/dL — ABNORMAL HIGH (ref 70–99)
Potassium: 3.1 mmol/L — ABNORMAL LOW (ref 3.5–5.1)
Sodium: 134 mmol/L — ABNORMAL LOW (ref 135–145)

## 2021-09-27 LAB — TROPONIN I (HIGH SENSITIVITY)
Troponin I (High Sensitivity): 25 ng/L — ABNORMAL HIGH (ref <18)
Troponin I (High Sensitivity): 23 ng/L — ABNORMAL HIGH (ref <18)

## 2021-09-27 LAB — HEPATIC FUNCTION PANEL
ALT: 23 U/L (ref 0–44)
AST: 23 U/L (ref 15–41)
Albumin: 3.5 g/dL (ref 3.5–5.0)
Alkaline Phosphatase: 91 U/L (ref 38–126)
Bilirubin, Direct: 0.1 mg/dL (ref 0.0–0.2)
Indirect Bilirubin: 0.8 mg/dL (ref 0.3–0.9)
Total Bilirubin: 0.9 mg/dL (ref 0.3–1.2)
Total Protein: 6.8 g/dL (ref 6.5–8.1)

## 2021-09-27 LAB — D-DIMER, QUANTITATIVE: D-Dimer, Quant: 8.4 ug/mL-FEU — ABNORMAL HIGH (ref 0.00–0.50)

## 2021-09-27 LAB — CBC
HCT: 31.8 % — ABNORMAL LOW (ref 36.0–46.0)
Hemoglobin: 11 g/dL — ABNORMAL LOW (ref 12.0–15.0)
MCH: 32.2 pg (ref 26.0–34.0)
MCHC: 34.6 g/dL (ref 30.0–36.0)
MCV: 93 fL (ref 80.0–100.0)
Platelets: 247 10*3/uL (ref 150–400)
RBC: 3.42 MIL/uL — ABNORMAL LOW (ref 3.87–5.11)
RDW: 13.2 % (ref 11.5–15.5)
WBC: 11.6 10*3/uL — ABNORMAL HIGH (ref 4.0–10.5)
nRBC: 0 % (ref 0.0–0.2)

## 2021-09-27 LAB — URINALYSIS, ROUTINE W REFLEX MICROSCOPIC
Bilirubin Urine: NEGATIVE
Glucose, UA: NEGATIVE mg/dL
Hgb urine dipstick: NEGATIVE
Ketones, ur: NEGATIVE mg/dL
Nitrite: NEGATIVE
Protein, ur: NEGATIVE mg/dL
Specific Gravity, Urine: 1.01 (ref 1.005–1.030)
pH: 7 (ref 5.0–8.0)

## 2021-09-27 LAB — BRAIN NATRIURETIC PEPTIDE: B Natriuretic Peptide: 175 pg/mL — ABNORMAL HIGH (ref 0.0–100.0)

## 2021-09-27 MED ORDER — ENSURE ENLIVE PO LIQD
237.0000 mL | Freq: Two times a day (BID) | ORAL | Status: DC
  Administered 2021-09-28 – 2021-10-02 (×7): 237 mL via ORAL

## 2021-09-27 MED ORDER — SODIUM CHLORIDE 0.9 % IV BOLUS
500.0000 mL | Freq: Once | INTRAVENOUS | Status: AC
  Administered 2021-09-27: 500 mL via INTRAVENOUS

## 2021-09-27 MED ORDER — SODIUM CHLORIDE 0.9 % IV SOLN
2.0000 g | INTRAVENOUS | Status: AC
  Administered 2021-09-28 – 2021-10-01 (×4): 2 g via INTRAVENOUS
  Filled 2021-09-27 (×4): qty 20

## 2021-09-27 MED ORDER — SODIUM CHLORIDE 0.9 % IV SOLN
INTRAVENOUS | Status: DC
  Administered 2021-09-27: 900 mL via INTRAVENOUS

## 2021-09-27 MED ORDER — ONDANSETRON HCL 4 MG/2ML IJ SOLN
4.0000 mg | Freq: Four times a day (QID) | INTRAMUSCULAR | Status: DC | PRN
Start: 2021-09-27 — End: 2021-10-02

## 2021-09-27 MED ORDER — DIPHENHYDRAMINE HCL 50 MG/ML IJ SOLN: 50.0000 mg | Freq: Once | INTRAMUSCULAR | Status: DC

## 2021-09-27 MED ORDER — HYDRALAZINE HCL 20 MG/ML IJ SOLN
10.0000 mg | Freq: Once | INTRAMUSCULAR | Status: AC
  Administered 2021-09-27: 10 mg via INTRAVENOUS
  Filled 2021-09-27: qty 1

## 2021-09-27 MED ORDER — RIVAROXABAN 10 MG PO TABS: 10.0000 mg | ORAL_TABLET | Freq: Every day | ORAL | Status: DC

## 2021-09-27 MED ORDER — SODIUM CHLORIDE 1 G PO TABS
1.0000 g | ORAL_TABLET | Freq: Three times a day (TID) | ORAL | Status: DC
  Administered 2021-09-27 – 2021-10-01 (×12): 1 g via ORAL
  Filled 2021-09-27 (×18): qty 1

## 2021-09-27 MED ORDER — ROTIGOTINE 1 MG/24HR TD PT24
1.0000 mg | MEDICATED_PATCH | Freq: Every day | TRANSDERMAL | Status: DC
Start: 2021-09-27 — End: 2021-09-27

## 2021-09-27 MED ORDER — SODIUM CHLORIDE 0.9 % IV BOLUS
1000.0000 mL | Freq: Once | INTRAVENOUS | Status: AC
Start: 2021-09-27 — End: 2021-09-27
  Administered 2021-09-27: 1000 mL via INTRAVENOUS

## 2021-09-27 MED ORDER — SODIUM CHLORIDE 0.9 % IV SOLN
2.0000 g | Freq: Once | INTRAVENOUS | Status: AC
  Administered 2021-09-27: 2 g via INTRAVENOUS
  Filled 2021-09-27: qty 20

## 2021-09-27 MED ORDER — SODIUM CHLORIDE 0.9 % IV SOLN
500.0000 mg | INTRAVENOUS | Status: DC
  Administered 2021-09-28 – 2021-09-29 (×2): 500 mg via INTRAVENOUS
  Filled 2021-09-27 (×2): qty 5

## 2021-09-27 MED ORDER — ONDANSETRON HCL 4 MG PO TABS: 4.0000 mg | ORAL_TABLET | Freq: Four times a day (QID) | ORAL | Status: DC | PRN

## 2021-09-27 MED ORDER — METHYLPREDNISOLONE SODIUM SUCC 40 MG IJ SOLR
40.0000 mg | Freq: Once | INTRAMUSCULAR | Status: DC
  Filled 2021-09-27: qty 1

## 2021-09-27 MED ORDER — LEVOTHYROXINE SODIUM 75 MCG PO TABS
75.0000 ug | ORAL_TABLET | Freq: Every day | ORAL | Status: DC
  Administered 2021-09-28 – 2021-10-02 (×5): 75 ug via ORAL
  Filled 2021-09-27 (×5): qty 1

## 2021-09-27 MED ORDER — ROTIGOTINE 1 MG/24HR TD PT24
1.0000 mg | MEDICATED_PATCH | Freq: Every day | TRANSDERMAL | Status: DC
  Administered 2021-09-28 – 2021-10-01 (×4): 1 mg via TRANSDERMAL
  Filled 2021-09-27 (×7): qty 1

## 2021-09-27 MED ORDER — DILTIAZEM HCL ER COATED BEADS 180 MG PO CP24
180.0000 mg | ORAL_CAPSULE | Freq: Every day | ORAL | Status: DC
  Administered 2021-09-28 – 2021-10-02 (×5): 180 mg via ORAL
  Filled 2021-09-27 (×5): qty 1

## 2021-09-27 MED ORDER — DIPHENHYDRAMINE HCL 25 MG PO CAPS: 50.0000 mg | ORAL_CAPSULE | Freq: Once | ORAL | Status: DC

## 2021-09-27 MED ORDER — ZOLEDRONIC ACID 4 MG/5ML IV CONC: 4.0000 mg | Freq: Once | INTRAVENOUS | Status: DC

## 2021-09-27 MED ORDER — PREGABALIN 75 MG PO CAPS
200.0000 mg | ORAL_CAPSULE | Freq: Every day | ORAL | Status: DC
  Administered 2021-09-27 – 2021-10-01 (×5): 200 mg via ORAL
  Filled 2021-09-27 (×5): qty 1

## 2021-09-27 MED ORDER — AZITHROMYCIN 500 MG IV SOLR
500.0000 mg | Freq: Once | INTRAVENOUS | Status: AC
Start: 2021-09-27 — End: 2021-09-27
  Administered 2021-09-27: 500 mg via INTRAVENOUS
  Filled 2021-09-27: qty 5

## 2021-09-27 NOTE — H&P (Addendum)
?History and Physical  ? ? ?PatientMarland Kitchen Forever Arechiga Weaver DSK:[id number] DOB: [birth year] ?DOA: 09/27/2021 ?DOS: the patient was seen and examined on 09/27/2021 ?PCP: Glenda Chroman, MD  ?Patient coming from: Home ? ?Chief Complaint:  ?Chief Complaint  ?Patient presents with  ? Loss of Consciousness  ? ?HPI: [name] is a 76 y.o. 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  She also has a history of chronic hyponatremia and she has been admitted to the hospital for this, including on 07/30/21.  During that time she was discharged on sodium tablets.  Patient presents with syncopal episode she was taking a walk on the road.  EMS was called and transported her to the hospital.  It is unknown for how long she was unconscious.  Here, she was noted to be hypoxic and she admits to feeling short of breath.  She had an equivocal cough.  No fevers, chills, nausea, vomiting.  Appetite has been mildly decreased. ? ?She denies pain in her legs, abdominal pain, confusion.  She does admit to being on calcium supplementation daily.  She has been on this for a long time ? ?Review of Systems: As mentioned in the history of present illness. All other systems reviewed and are negative. ?Past Medical History:  ?Diagnosis Date  ? Dysphasia   ? Endometrial cancer ([place])   ? Heel spur   ? Heparin induced thrombocytopenia ([facility])   ? Hypercholesteremia   ? Hypertension   ? Hypothyroidism   ? Migraine headache   ? Osteopenia   ? RLS (restless legs syndrome)   ? SVC syndrome   ? Thyroid disease   ? ?Past Surgical History:  ?Procedure Laterality Date  ? ABDOMINAL HYSTERECTOMY    ? ANKLE SURGERY Right   ? 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 FINGER AMPUTATION    ? right index finger at knuckle  ? PORT-A-CATH REMOVAL    ? TONSILLECTOMY    ? ?Social History:  reports  that she has never smoked. She has never used smokeless tobacco. She reports that she does not drink alcohol and does not use drugs. ? ?Allergies  ?Allergen Reactions  ? Iodinated Contrast Media Hives and Rash  ? Atorvastatin Calcium Other (See Comments)  ?  Musculoskeletal aches ?Other reaction(s): Other (See Comments) ?Musculoskeletal aches  ? Doxycycline Other (See Comments)  ?  Liver problems ?Other reaction(s): Other (See Comments) ?Liver problems  ? Heparin Other (See Comments)  ?  Causes blood clots. ?Other reaction(s): Other (See Comments) ?Causes blood clots.  ? Lisinopril Other (See Comments)  ?  cough ?Other reaction(s): Other (See Comments) ?cough  ? Lovastatin Diarrhea  ? Ropinirole Hcl Nausea Only  ? ? ?Family History  ?Problem Relation Age of Onset  ? Stroke Mother   ? Transient ischemic attack Father   ? Dementia Father   ? Pneumonia Father   ? Diabetes Sister   ? Rheum arthritis Sister   ? Brain cancer Brother   ? Breast cancer Maternal Grandmother   ? Stroke Paternal Grandmother   ? ? ?Prior to Admission medications   ?Medication Sig Start Date End Date Taking? Authorizing Provider  ?Ascorbic Acid (VITAMIN C) 1000 MG tablet Take 1,000 mg by mouth 2 (two) times a day.   Yes [provider]  ?b complex vitamins tablet Take 2 tablets by mouth daily.  Yes [provider]  ?diltiazem (CARDIZEM CD) 180 MG 24 hr capsule Take 180 mg by mouth daily.   Yes [provider]  ?levothyroxine (SYNTHROID) 75 MCG tablet Take 75 mcg by mouth daily before breakfast.   Yes [provider]  ?Magnesium Malate 1250 (141.7 Mg) MG TABS Take 1,250 mg by mouth. Two tablets daily.   Yes [provider]  ?nitrofurantoin (MACRODANTIN) 100 MG capsule Take 100 mg by mouth daily.   Yes [provider]  ?Potassium 99 MG TABS Take 1 tablet by mouth 2 (two) times a day.   Yes [provider]  ?pregabalin (LYRICA) 100 MG capsule TAKE 2 CAPSULES AT BEDTIME. MAY TAKE AN  EXTRA ONE IF NEEDED. 08/13/21  Yes Suzzanne Cloud, NP  ?Probiotic Product (ALIGN PO) Take 1 tablet by mouth daily.   Yes [provider]  ?Rotigotine (NEUPRO) 1 MG/24HR PT24 Place 1 patch (1 mg total) onto the skin daily. 08/13/21  Yes Suzzanne Cloud, NP  ?sodium chloride 1 g tablet Take 1 g by mouth 3 (three) times daily. 08/26/21  Yes [provider]  ?UNABLE TO FIND Take 500 mg by mouth daily. Med Name: Beet Root   Yes [provider]  ? ? ?Physical Exam: ?Vitals:  ? 09/27/21 1830 09/27/21 1900 09/27/21 1915 09/27/21 1940  ?BP: (!) 185/80 (!) 183/78    ?Pulse: 86 85 83   ?Resp: 13 (!) 25 19   ?Temp:      ?TempSrc:      ?SpO2: 93% 94% 94% 94%  ?Weight:      ?Height:      ? ?General: Elderly female. Awake and alert and oriented x3. No acute cardiopulmonary distress.  ?HEENT: Normocephalic atraumatic.  Right and left ears normal in appearance.  Pupils equal, round, reactive to light. Extraocular muscles are intact. Sclerae anicteric and noninjected.  Moist mucosal membranes. No mucosal lesions.  ?Neck: Neck supple without lymphadenopathy. No carotid bruits. No masses palpated.  ?Cardiovascular: Regular rate with normal S1-S2 sounds. No murmurs, rubs, gallops auscultated. No JVD.  ?Respiratory: Good respiratory effort with no wheezes, rales, rhonchi. Lungs clear to auscultation bilaterally.  No accessory muscle use. ?Abdomen: Soft, nontender, nondistended. Active bowel sounds. No masses or hepatosplenomegaly  ?Skin: No rashes, lesions, or ulcerations.  Dry, warm to touch. 2+ dorsalis pedis and radial pulses. ?Musculoskeletal: Status post left upper extremity amputation.  No calf or leg pain. All major joints not erythematous nontender.  No upper or lower joint deformation.  Good ROM.  No contractures  ?Psychiatric: Intact judgment and insight. Pleasant and cooperative. ?Neurologic: No focal neurological deficits. Strength is 5/5 and symmetric in lower extremities.  Cranial nerves II through XII  are grossly intact. ? ? ?Data Reviewed: ?Results for orders placed or performed during the hospital encounter of 09/27/21 (from the past 24 hour(s))  ?Troponin I (High Sensitivity)     Status: Abnormal  ? Collection Time: 09/27/21  4:24 PM  ?Result Value Ref Range  ? Troponin I (High Sensitivity) 25 (H) <18 ng/L  ?Hepatic function panel     Status: None  ? Collection Time: 09/27/21  4:24 PM  ?Result Value Ref Range  ? Total Protein 6.8 6.5 - 8.1 g/dL  ? Albumin 3.5 3.5 - 5.0 g/dL  ? AST 23 15 - 41 U/L  ? ALT 23 0 - 44 U/L  ? Alkaline Phosphatase 91 38 - 126 U/L  ? Total Bilirubin 0.9 0.3 - 1.2 mg/dL  ?  Bilirubin, Direct 0.1 0.0 - 0.2 mg/dL  ? Indirect Bilirubin 0.8 0.3 - 0.9 mg/dL  ?D-dimer, quantitative     Status: Abnormal  ? Collection Time: 09/27/21  4:24 PM  ?Result Value Ref Range  ? D-Dimer, Quant 8.40 (H) 0.00 - 0.50 ug/mL-FEU  ?Brain natriuretic peptide     Status: Abnormal  ? Collection Time: 09/27/21  4:24 PM  ?Result Value Ref Range  ? B Natriuretic Peptide 175.0 (H) 0.0 - 100.0 pg/mL  ?Basic metabolic panel     Status: Abnormal  ? Collection Time: 09/27/21  4:28 PM  ?Result Value Ref Range  ? Sodium 134 (L) 135 - 145 mmol/L  ? Potassium 3.1 (L) 3.5 - 5.1 mmol/L  ? Chloride 99 98 - 111 mmol/L  ? CO2 29 22 - 32 mmol/L  ? Glucose, Bld 119 (H) 70 - 99 mg/dL  ? BUN 32 (H) 8 - 23 mg/dL  ? Creatinine, Ser 2.28 (H) 0.44 - 1.00 mg/dL  ? Calcium 14.5 (HH) 8.9 - 10.3 mg/dL  ? GFR, Estimated 22 (L) >60 mL/min  ? Anion gap 6 5 - 15  ?CBC     Status: Abnormal  ? Collection Time: 09/27/21  4:28 PM  ?Result Value Ref Range  ? WBC 11.6 (H) 4.0 - 10.5 K/uL  ? RBC 3.42 (L) 3.87 - 5.11 MIL/uL  ? Hemoglobin 11.0 (L) 12.0 - 15.0 g/dL  ? HCT 31.8 (L) 36.0 - 46.0 %  ? MCV 93.0 80.0 - 100.0 fL  ? MCH 32.2 26.0 - 34.0 pg  ? MCHC 34.6 30.0 - 36.0 g/dL  ? RDW 13.2 11.5 - 15.5 %  ? Platelets 247 150 - 400 K/uL  ? nRBC 0.0 0.0 - 0.2 %  ?Urinalysis, Routine w reflex microscopic     Status: Abnormal  ? Collection Time: 09/27/21  5:14  PM  ?Result Value Ref Range  ? Color, Urine YELLOW YELLOW  ? APPearance CLEAR CLEAR  ? Specific Gravity, Urine 1.010 1.005 - 1.030  ? pH 7.0 5.0 - 8.0  ? Glucose, UA NEGATIVE NEGATIVE mg/dL  ? Hgb urine d

## 2021-09-27 NOTE — ED Provider Notes (Signed)
?[place] ?Provider Note ? ? ?CSN: [id number] ?Arrival date & time: 09/27/21  1558 ? ?  ? ?History ? ?Chief Complaint  ?Patient presents with  ? Loss of Consciousness  ? ? ?[name] is a 76 y.o. female. ? ?Patient had a syncopal episode.  She has no complaints right now.  Patient has a history of hypertension and endometrial cancer over 10 years ago ? ?The history is provided by the patient and medical records. No language interpreter was used.  ?Loss of Consciousness ?Episode history:  Single ?Most recent episode:  Today ?Timing:  Intermittent ?Progression:  Resolved ?Chronicity:  New ?Witnessed: no   ?Relieved by:  Nothing ?Worsened by:  Nothing ?Ineffective treatments:  None tried ?Associated symptoms: dizziness   ?Associated symptoms: no anxiety, no chest pain, no headaches and no seizures   ?Risk factors: no congenital heart disease   ? ?  ? ?Home Medications ?Prior to Admission medications   ?Medication Sig Start Date End Date Taking? Authorizing Provider  ?Ascorbic Acid (VITAMIN C) 1000 MG tablet Take 1,000 mg by mouth 2 (two) times a day.   Yes [provider]  ?b complex vitamins tablet Take 2 tablets by mouth daily.   Yes [provider]  ?diltiazem (CARDIZEM CD) 180 MG 24 hr capsule Take 180 mg by mouth daily.   Yes [provider]  ?levothyroxine (SYNTHROID) 75 MCG tablet Take 75 mcg by mouth daily before breakfast.   Yes [provider]  ?Magnesium Malate 1250 (141.7 Mg) MG TABS Take 1,250 mg by mouth. Two tablets daily.   Yes [provider]  ?nitrofurantoin (MACRODANTIN) 100 MG capsule Take 100 mg by mouth daily.   Yes [provider]  ?Potassium 99 MG TABS Take 1 tablet by mouth 2 (two) times a day.   Yes [provider]  ?pregabalin (LYRICA) 100 MG capsule TAKE 2 CAPSULES AT BEDTIME. MAY TAKE AN EXTRA ONE IF NEEDED. 08/13/21  Yes Suzzanne Cloud, NP  ?Probiotic Product (ALIGN PO) Take 1 tablet by mouth daily.    Yes [provider]  ?Rotigotine (NEUPRO) 1 MG/24HR PT24 Place 1 patch (1 mg total) onto the skin daily. 08/13/21  Yes Suzzanne Cloud, NP  ?sodium chloride 1 g tablet Take 1 g by mouth 3 (three) times daily. 08/26/21  Yes [provider]  ?UNABLE TO FIND Take 500 mg by mouth daily. Med Name: Beet Root   Yes [provider]  ?   ? ?Allergies    ?Iodinated contrast media, Atorvastatin calcium, Doxycycline, Heparin, Lisinopril, Lovastatin, and Ropinirole hcl   ? ?Review of Systems   ?Review of Systems  ?Constitutional:  Negative for appetite change and fatigue.  ?HENT:  Negative for congestion, ear discharge and sinus pressure.   ?Eyes:  Negative for discharge.  ?Respiratory:  Negative for cough.   ?Cardiovascular:  Positive for syncope. Negative for chest pain.  ?Gastrointestinal:  Negative for abdominal pain and diarrhea.  ?Genitourinary:  Negative for frequency and hematuria.  ?Musculoskeletal:  Negative for back pain.  ?Skin:  Negative for rash.  ?Neurological:  Positive for dizziness. Negative for seizures and headaches.  ?Psychiatric/Behavioral:  Negative for hallucinations.   ? ?Physical Exam ?Updated Vital Signs ?BP (!) 185/80   Pulse 86   Temp 98.1 ?F (36.7 ?C) (Oral)   Resp 13   Ht '5\' 4"'$  (1.626 m)   Wt 62.1 kg   SpO2 93%   BMI 23.52 kg/m?  ?Physical Exam ?Vitals  and nursing note reviewed.  ?Constitutional:   ?   Appearance: She is well-developed.  ?HENT:  ?   Head: Normocephalic.  ?   Nose: Nose normal.  ?Eyes:  ?   General: No scleral icterus. ?   Conjunctiva/sclera: Conjunctivae normal.  ?Neck:  ?   Thyroid: No thyromegaly.  ?Cardiovascular:  ?   Rate and Rhythm: Normal rate and regular rhythm.  ?   Heart sounds: No murmur heard. ?  No friction rub. No gallop.  ?Pulmonary:  ?   Breath sounds: No stridor. No wheezing or rales.  ?Chest:  ?   Chest wall: No tenderness.  ?Abdominal:  ?   General: There is no distension.  ?   Tenderness: There is no abdominal tenderness. There is  no rebound.  ?Musculoskeletal:     ?   General: Normal range of motion.  ?   Cervical back: Neck supple.  ?Lymphadenopathy:  ?   Cervical: No cervical adenopathy.  ?Skin: ?   Findings: No erythema or rash.  ?Neurological:  ?   Mental Status: She is alert and oriented to person, place, and time.  ?   Motor: No abnormal muscle tone.  ?   Coordination: Coordination normal.  ?Psychiatric:     ?   Behavior: Behavior normal.  ? ? ?ED Results / Procedures / Treatments   ?Labs ?(all labs ordered are listed, but only abnormal results are displayed) ?Labs Reviewed  ?BASIC METABOLIC PANEL - Abnormal; Notable for the following components:  ?    Result Value  ? Sodium 134 (*)   ? Potassium 3.1 (*)   ? Glucose, Bld 119 (*)   ? BUN 32 (*)   ? Creatinine, Ser 2.28 (*)   ? Calcium 14.5 (*)   ? GFR, Estimated 22 (*)   ? All other components within normal limits  ?CBC - Abnormal; Notable for the following components:  ? WBC 11.6 (*)   ? RBC 3.42 (*)   ? Hemoglobin 11.0 (*)   ? HCT 31.8 (*)   ? All other components within normal limits  ?URINALYSIS, ROUTINE W REFLEX MICROSCOPIC - Abnormal; Notable for the following components:  ? Leukocytes,Ua TRACE (*)   ? All other components within normal limits  ?D-DIMER, QUANTITATIVE - Abnormal; Notable for the following components:  ? D-Dimer, Quant 8.40 (*)   ? All other components within normal limits  ?BRAIN NATRIURETIC PEPTIDE - Abnormal; Notable for the following components:  ? B Natriuretic Peptide 175.0 (*)   ? All other components within normal limits  ?URINALYSIS, MICROSCOPIC (REFLEX) - Abnormal; Notable for the following components:  ? Bacteria, UA RARE (*)   ? All other components within normal limits  ?TROPONIN I (HIGH SENSITIVITY) - Abnormal; Notable for the following components:  ? Troponin I (High Sensitivity) 25 (*)   ? All other components within normal limits  ?HEPATIC FUNCTION PANEL  ?CALCIUM, IONIZED  ?CALCITRIOL (1,25 DI-OH VIT D)  ?PARATHYROID HORMONE, INTACT (NO CA)   ?PTH-RELATED PEPTIDE  ?TROPONIN I (HIGH SENSITIVITY)  ? ? ?EKG ?None ? ?Radiology ?DG Chest 1 View ? ?Result Date: 09/27/2021 ?CLINICAL DATA:  Syncopal episode EXAM: CHEST  1 VIEW COMPARISON:  Chest x-ray dated July 30, 2021 FINDINGS: Visualized cardiac and mediastinal contours are within normal limits. Small bilateral pleural effusions. Bibasilar opacities. No evidence of pneumothorax. IMPRESSION: 1. Small bilateral pleural effusions. 2. Bibasilar opacities which are likely due to atelectasis, aspiration or infection could appear similar. Electronically Signed  By: Yetta Glassman M.D.   On: 09/27/2021 16:48  ? ?CT Head Wo Contrast ? ?Result Date: 09/27/2021 ?CLINICAL DATA:  Syncopal episode. Fall. Blunt head trauma. Headache. EXAM: CT HEAD WITHOUT CONTRAST TECHNIQUE: Contiguous axial images were obtained from the base of the skull through the vertex without intravenous contrast. RADIATION DOSE REDUCTION: This exam was performed according to the departmental dose-optimization program which includes automated exposure control, adjustment of the mA and/or kV according to patient size and/or use of iterative reconstruction technique. COMPARISON:  07/30/2021 FINDINGS: Brain: Mild acute subdural hematoma is seen along the falx measuring 3 mm in thickness. No evidence of intraparenchymal hemorrhage, acute infarction, hydrocephalus, or mass lesion/mass effect. Mild diffuse cerebral atrophy and chronic small vessel disease again noted Vascular:  No hyperdense vessel or other acute findings. Skull: No evidence of fracture or other significant bone abnormality. Sinuses/Orbits:  No acute findings. Other: None. IMPRESSION: Mild acute subdural hematoma along the falx measuring 3 mm in thickness. No evidence of mass effect or herniation. No evidence of skull fracture. Mild cerebral atrophy and chronic small vessel disease. Critical Value/emergent results were called by telephone at the time of interpretation on 09/27/2021 at 4:58  pm to provider Jamilla Galli , who verbally acknowledged these results. Electronically Signed   By: Marlaine Hind M.D.   On: 09/27/2021 17:00  ? ?CT Chest Wo Contrast ? ?Result Date: 09/27/2021 ?CLINICAL DATA:  Syncope.

## 2021-09-27 NOTE — ED Triage Notes (Signed)
Pt to the ED with [facility] from home after a syncopal episode while taking a walk along the road. ? ?EMS reports she did hit her head and the pt denies being on a blood thinner. ? ?Denies pain. ? ?

## 2021-09-27 NOTE — ED Notes (Signed)
Patient transported to CT 

## 2021-09-27 NOTE — ED Notes (Signed)
Date and time results received: 09/27/21 5:40 PM ? ? ?Test: Ca ?Critical Value:14.5  ? ?Name of Provider Notified: Dr. Roderic Palau ? ?Orders Received? Or Actions Taken?: See orders ?

## 2021-09-28 ENCOUNTER — Inpatient Hospital Stay (HOSPITAL_COMMUNITY): Payer: Medicare PPO

## 2021-09-28 DIAGNOSIS — S065XAA Traumatic subdural hemorrhage with loss of consciousness status unknown, initial encounter: Secondary | ICD-10-CM

## 2021-09-28 DIAGNOSIS — R55 Syncope and collapse: Secondary | ICD-10-CM

## 2021-09-28 DIAGNOSIS — J189 Pneumonia, unspecified organism: Secondary | ICD-10-CM

## 2021-09-28 DIAGNOSIS — N179 Acute kidney failure, unspecified: Secondary | ICD-10-CM | POA: Diagnosis not present

## 2021-09-28 LAB — CBC
HCT: 33.6 % — ABNORMAL LOW (ref 36.0–46.0)
Hemoglobin: 11.3 g/dL — ABNORMAL LOW (ref 12.0–15.0)
MCH: 31.4 pg (ref 26.0–34.0)
MCHC: 33.6 g/dL (ref 30.0–36.0)
MCV: 93.3 fL (ref 80.0–100.0)
Platelets: 254 10*3/uL (ref 150–400)
RBC: 3.6 MIL/uL — ABNORMAL LOW (ref 3.87–5.11)
RDW: 13.2 % (ref 11.5–15.5)
WBC: 10.6 10*3/uL — ABNORMAL HIGH (ref 4.0–10.5)
nRBC: 0 % (ref 0.0–0.2)

## 2021-09-28 LAB — COMPREHENSIVE METABOLIC PANEL
ALT: 20 U/L (ref 0–44)
AST: 19 U/L (ref 15–41)
Albumin: 3 g/dL — ABNORMAL LOW (ref 3.5–5.0)
Alkaline Phosphatase: 80 U/L (ref 38–126)
Anion gap: 8 (ref 5–15)
BUN: 27 mg/dL — ABNORMAL HIGH (ref 8–23)
CO2: 24 mmol/L (ref 22–32)
Calcium: 12.2 mg/dL — ABNORMAL HIGH (ref 8.9–10.3)
Chloride: 106 mmol/L (ref 98–111)
Creatinine, Ser: 1.83 mg/dL — ABNORMAL HIGH (ref 0.44–1.00)
GFR, Estimated: 28 mL/min — ABNORMAL LOW (ref >60)
Glucose, Bld: 99 mg/dL (ref 70–99)
Potassium: 2.8 mmol/L — ABNORMAL LOW (ref 3.5–5.1)
Sodium: 138 mmol/L (ref 135–145)
Total Bilirubin: 0.8 mg/dL (ref 0.3–1.2)
Total Protein: 5.9 g/dL — ABNORMAL LOW (ref 6.5–8.1)

## 2021-09-28 LAB — ECHOCARDIOGRAM COMPLETE
Area-P 1/2: 3.6 cm2
Calc EF: 71.7 %
Height: 64 in
MV VTI: 2.8 cm2
S' Lateral: 2.6 cm
Single Plane A2C EF: 64.4 %
Single Plane A4C EF: 79.5 %
Weight: 2192 oz

## 2021-09-28 LAB — TSH: TSH: 2.416 u[IU]/mL (ref 0.350–4.500)

## 2021-09-28 MED ORDER — HYDRALAZINE HCL 20 MG/ML IJ SOLN
10.0000 mg | Freq: Four times a day (QID) | INTRAMUSCULAR | Status: DC | PRN
  Administered 2021-09-28 – 2021-09-29 (×3): 10 mg via INTRAVENOUS
  Filled 2021-09-28 (×3): qty 1

## 2021-09-28 MED ORDER — IPRATROPIUM-ALBUTEROL 0.5-2.5 (3) MG/3ML IN SOLN
3.0000 mL | Freq: Three times a day (TID) | RESPIRATORY_TRACT | Status: DC
  Administered 2021-09-28: 3 mL via RESPIRATORY_TRACT
  Filled 2021-09-28: qty 3

## 2021-09-28 MED ORDER — POTASSIUM CHLORIDE CRYS ER 20 MEQ PO TBCR
40.0000 meq | EXTENDED_RELEASE_TABLET | ORAL | Status: AC
  Administered 2021-09-28 (×2): 40 meq via ORAL
  Filled 2021-09-28 (×2): qty 2

## 2021-09-28 NOTE — Progress Notes (Signed)
?PROGRESS NOTE ? ? ? ? ?[name], is a 76 y.o. female, DOB - [birth year], OAC:[id number] ? ?Admit date - 09/27/2021   Admitting Physician Truett Mainland, DO ? ?Outpatient Primary MD for the patient is Glenda Chroman, MD ? ?LOS - 1 ? ?Chief Complaint  ?Patient presents with  ? Loss of Consciousness  ?    ? ? ?Brief Narrative:  ? 76 y.o. 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 ?  ?-Assessment and Plan: ? ?1)CAP/Pneumonia with acute hypoxic respiratory failure --- currently requiring 2 L of oxygen via nasal cannula ?-Continue Rocephin/azithromycin bronchodilators and mucolytics ?Continue bronchodilators ? ?2)Syncope----Echo with EF of 72% and grade 1 diastolic dysfunction, no significant wall motion normalities, no significant aortic stenosis or mitral stenosis ?-On telemetry monitored unit no significant arrhythmias at this time ? ?3)Small right-sided subdural hematoma--- stable clinically and per serial CT head ?-Apparently EDP discussed with on-call neurosurgeon who recommends outpatient follow-up ? ?4) hypercalcemia--query dehydration related in the setting of calcium supplementation ?-PTH and vitamin D levels pending ?-If fails to improve with IV fluids consider biphosphonate if fails to improve consider malignancy work-up ? ?5) hyponatremia----sodium normalized with IV fluids ?-This appears to be a recurrent/chronic problem patient previously treated with sodium chloride tablets ? ?6) hypokalemia--- replace and recheck ? ?7)AKI----acute kidney injury due to dehydration ?- creatinine on admission= 2.28 ,  ?baseline creatinine = 0.7 to 0.8   , creatinine is now=  1.83, renally adjust medications, avoid nephrotoxic agents / dehydration   / hypotension ?--Renal function improving with hydration ? ?Disposition/Need for in-Hospital Stay- patient unable to be discharged at this time due to --hypercalcemia requiring further hydration and possible biphosphonate's, intracranial hemorrhage requiring further observation/monitoring ?-Possible discharge home in 24 to 48 hours if improves, may need home health PT ? ?Status is: Inpatient  ? ?Disposition: The patient is from: Home ?             Anticipated d/c is to: Home ?             Anticipated d/c date is: 1 day ?             Patient currently is not medically stable to d/c. ?Barriers: Not Clinically Stable-  ? ?Code Status :  -  Code Status: Full Code  ? ?Family Communication:    (patient is alert, awake and coherent)  ?-Husband at bedside ? ?DVT Prophylaxis  :   - SCDs    ? ? ?Lab Results  ?Component Value Date  ? PLT 254 09/28/2021  ? ? ?Inpatient Medications ? ?Scheduled Meds: ? diltiazem  180 mg Oral Daily  ? feeding supplement  237 mL Oral BID BM  ? levothyroxine  75 mcg Oral QAC breakfast  ? pregabalin  200 mg Oral QHS  ? Rotigotine  1 mg Transdermal Daily  ? sodium chloride  1 g Oral TID  ? ?Continuous Infusions: ? sodium chloride 150 mL/hr at 09/28/21 1040  ? azithromycin    ? cefTRIAXone (ROCEPHIN)  IV    ? ?PRN Meds:.hydrALAZINE, ondansetron **OR** ondansetron (ZOFRAN) IV ? ? ?Anti-infectives (From admission, onward)  ? ? Start     Dose/Rate Route Frequency Ordered Stop  ? 09/28/21 2000  azithromycin (ZITHROMAX) 500 mg in sodium chloride 0.9 % 250  mL IVPB       ? 500 mg ?250 mL/hr over 60 Minutes Intravenous Every 24 hours 09/27/21 1939 10/02/21 1959  ? 09/28/21 1900  cefTRIAXone (ROCEPHIN) 2 g in sodium chloride 0.9 % 100 mL IVPB       ? 2 g ?200 mL/hr over 30 Minutes Intravenous Every 24 hours 09/27/21 1939 10/02/21 1859  ? 09/27/21 1845  cefTRIAXone (ROCEPHIN) 2 g in sodium chloride 0.9 % 100 mL IVPB       ? 2 g ?200 mL/hr over 30 Minutes Intravenous  Once 09/27/21 1834 09/27/21 1946  ?  09/27/21 1845  azithromycin (ZITHROMAX) 500 mg in sodium chloride 0.9 % 250 mL IVPB       ? 500 mg ?250 mL/hr over 60 Minutes Intravenous  Once 09/27/21 1834 09/27/21 2117  ? ?  ? ?  ? ?Subjective: ?[name] today has no fevers, no emesis,  No chest pain, no acute distress ? ? ?Objective: ?Vitals:  ? 09/28/21 0439 09/28/21 0720 09/28/21 0918 09/28/21 1432  ?BP: (!) 198/83 (!) 178/79 (!) 180/75 (!) 196/86  ?Pulse: 89 90 94 77  ?Resp: 18  (!) 24 (!) 24  ?Temp: 97.6 ?F (36.4 ?C)  98.9 ?F (37.2 ?C) 97.9 ?F (36.6 ?C)  ?TempSrc:   Oral Oral  ?SpO2: 94%  95% 94%  ?Weight:      ?Height:      ? ? ?Intake/Output Summary (Last 24 hours) at 09/28/2021 1751 ?Last data filed at 09/28/2021 7628 ?Gross per 24 hour  ?Intake 1486.99 ml  ?Output --  ?Net 1486.99 ml  ? ?Filed Weights  ? 09/27/21 1604  ?Weight: 62.1 kg  ? ? ?Physical Exam ? ?Gen:- Awake Alert, in no acute distress ?HEENT:- [place].AT, No sclera icterus ?Nose- [place] 2L/min ?Neck-Supple Neck,No JVD,.  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 ?Data Reviewed: I have personally reviewed following labs and imaging studies ? ?CBC: ?Recent Labs  ?Lab 09/27/21 ?1628 09/28/21 ?0403  ?WBC 11.6* 10.6*  ?HGB 11.0* 11.3*  ?HCT 31.8* 33.6*  ?MCV 93.0 93.3  ?PLT 247 254  ? ?Basic Metabolic Panel: ?Recent Labs  ?Lab 09/27/21 ?1628 09/28/21 ?0403  ?NA 134* 138  ?K 3.1* 2.8*  ?CL 99 106  ?CO2 29 24  ?GLUCOSE 119* 99  ?BUN 32* 27*  ?CREATININE 2.28* 1.83*  ?CALCIUM 14.5* 12.2*  ? ?GFR: ?Estimated Creatinine Clearance: 22.9 mL/min (A) (by C-G formula based on SCr of 1.83 mg/dL (H)). ?Liver Function Tests: ?Recent Labs  ?Lab 09/27/21 ?1624 09/28/21 ?0403  ?AST 23 19  ?ALT 23  20  ?ALKPHOS 91 80  ?BILITOT 0.9 0.8  ?PROT 6.8 5.9*  ?ALBUMIN 3.5 3.0*  ? ?Cardiac Enzymes: ?No results for input(s): CKTOTAL, CKMB, CKMBINDEX, TROPONINI in the last 168 hours. ?BNP (last 3 results) ?No results for input(s): PROBNP in the last 8760 hours. ?HbA1C: ?No results for input(s): HGBA1C in the last 72 hours. ?Sepsis Labs: ?'@LABRCNTIP'$ (procalcitonin:4,lacticidven:4) ?)No results found for this or any previous visit (from the past 240 hour(s)).  ? ? ?Radiology Studies: ?DG Chest 1 View ? ?Result Date: 09/27/2021 ?CLINICAL DATA:  Syncopal episode EXAM: CHEST  1 VIEW COMPARISON:  Chest x-ray  dated July 30, 2021 FINDINGS: Visualized cardiac and mediastinal contours are within normal limits. Small bilateral pleural effusions. Bibasilar opacities. No evidence of pneumothorax. IMPRESSION: 1. Small bilateral pleural effusions. 2. Bibasilar opacities which are likely due to atelectasis, aspiration or infection could appear similar. Electronically Signed   By: Yetta Glassman M.D.   On: 09/27/2021 16:48  ? ?CT HEAD WO CONTRAST (5MM) ? ?Result Date: 09/28/2021 ?CLINICAL DATA:  Follow-up subdural hematoma. EXAM: CT HEAD WITHOUT CONTRAST TECHNIQUE: Contiguous axial images were obtained from the base of the skull through the vertex without intravenous contrast. RADIATION DOSE REDUCTION: This exam was performed according to the departmental dose-optimization program which includes automated exposure control, adjustment of the mA and/or kV according to patient size and/or use of iterative reconstruction technique. COMPARISON:  CT scan of the head Sep 27, 2021 FINDINGS: Brain: The known subdural hematoma along the falx persists measuring 3 mm on today's study and the previous study. No new subdural, epidural, or subarachnoid hemorrhage. Ventricles and sulci are stable. No blood in the ventricles. Cerebellum, brainstem, and basal cisterns are stable. No mass effect or midline shift. No acute cortical ischemia or infarct.1  Vascular: No hyperdense vessel or unexpected calcification. Skull: Normal. Negative for fracture or focal lesion. Sinuses/Orbits: No acute finding. Other: None. IMPRESSION: 1. No significant interval change in the

## 2021-09-28 NOTE — Progress Notes (Signed)
?  Echocardiogram ?2D Echocardiogram has been performed. ? ?[name] ?09/28/2021, 12:16 PM ?

## 2021-09-29 DIAGNOSIS — R55 Syncope and collapse: Secondary | ICD-10-CM | POA: Diagnosis not present

## 2021-09-29 DIAGNOSIS — S065XAA Traumatic subdural hemorrhage with loss of consciousness status unknown, initial encounter: Secondary | ICD-10-CM | POA: Diagnosis not present

## 2021-09-29 DIAGNOSIS — J189 Pneumonia, unspecified organism: Secondary | ICD-10-CM | POA: Diagnosis not present

## 2021-09-29 LAB — CALCITRIOL (1,25 DI-OH VIT D): Vit D, 1,25-Dihydroxy: 76.9 pg/mL (ref 24.8–81.5)

## 2021-09-29 LAB — MAGNESIUM: Magnesium: 1.6 mg/dL — ABNORMAL LOW (ref 1.7–2.4)

## 2021-09-29 LAB — RENAL FUNCTION PANEL
Albumin: 3 g/dL — ABNORMAL LOW (ref 3.5–5.0)
Anion gap: 7 (ref 5–15)
BUN: 23 mg/dL (ref 8–23)
CO2: 21 mmol/L — ABNORMAL LOW (ref 22–32)
Calcium: 10.4 mg/dL — ABNORMAL HIGH (ref 8.9–10.3)
Chloride: 111 mmol/L (ref 98–111)
Creatinine, Ser: 1.55 mg/dL — ABNORMAL HIGH (ref 0.44–1.00)
GFR, Estimated: 35 mL/min — ABNORMAL LOW (ref >60)
Glucose, Bld: 114 mg/dL — ABNORMAL HIGH (ref 70–99)
Phosphorus: 1.7 mg/dL — ABNORMAL LOW (ref 2.5–4.6)
Potassium: 3.1 mmol/L — ABNORMAL LOW (ref 3.5–5.1)
Sodium: 139 mmol/L (ref 135–145)

## 2021-09-29 LAB — CALCIUM, IONIZED: Calcium, Ionized, Serum: 8.5 mg/dL — ABNORMAL HIGH (ref 4.5–5.6)

## 2021-09-29 LAB — PARATHYROID HORMONE, INTACT (NO CA): PTH: 16 pg/mL (ref 15–65)

## 2021-09-29 MED ORDER — LABETALOL HCL 5 MG/ML IV SOLN
20.0000 mg | Freq: Once | INTRAVENOUS | Status: DC
  Filled 2021-09-29: qty 4

## 2021-09-29 MED ORDER — HYDRALAZINE HCL 25 MG PO TABS
50.0000 mg | ORAL_TABLET | Freq: Three times a day (TID) | ORAL | Status: DC
  Administered 2021-09-29 – 2021-10-02 (×8): 50 mg via ORAL
  Filled 2021-09-29 (×8): qty 2

## 2021-09-29 MED ORDER — ISOSORBIDE MONONITRATE ER 60 MG PO TB24
30.0000 mg | ORAL_TABLET | Freq: Every day | ORAL | Status: DC
  Administered 2021-09-29 – 2021-10-02 (×4): 30 mg via ORAL
  Filled 2021-09-29 (×4): qty 1

## 2021-09-29 MED ORDER — MAGNESIUM SULFATE 2 GM/50ML IV SOLN
2.0000 g | Freq: Once | INTRAVENOUS | Status: AC
  Administered 2021-09-29: 2 g via INTRAVENOUS
  Filled 2021-09-29: qty 50

## 2021-09-29 MED ORDER — POTASSIUM CHLORIDE CRYS ER 20 MEQ PO TBCR
40.0000 meq | EXTENDED_RELEASE_TABLET | Freq: Once | ORAL | Status: AC
  Administered 2021-09-29: 40 meq via ORAL
  Filled 2021-09-29: qty 2

## 2021-09-29 MED ORDER — IPRATROPIUM-ALBUTEROL 0.5-2.5 (3) MG/3ML IN SOLN
3.0000 mL | Freq: Three times a day (TID) | RESPIRATORY_TRACT | Status: DC
  Administered 2021-09-29 – 2021-10-02 (×9): 3 mL via RESPIRATORY_TRACT
  Filled 2021-09-29 (×8): qty 3

## 2021-09-29 MED ORDER — ADULT MULTIVITAMIN W/MINERALS CH
1.0000 | ORAL_TABLET | Freq: Every day | ORAL | Status: DC
  Administered 2021-09-29 – 2021-10-02 (×4): 1 via ORAL
  Filled 2021-09-29 (×4): qty 1

## 2021-09-29 MED ORDER — POTASSIUM CHLORIDE CRYS ER 20 MEQ PO TBCR
40.0000 meq | EXTENDED_RELEASE_TABLET | ORAL | Status: DC
Start: 2021-09-29 — End: 2021-09-29

## 2021-09-29 MED ORDER — HYDRALAZINE HCL 20 MG/ML IJ SOLN
10.0000 mg | Freq: Once | INTRAMUSCULAR | Status: AC
  Administered 2021-09-29: 10 mg via INTRAVENOUS
  Filled 2021-09-29: qty 1

## 2021-09-29 MED ORDER — POTASSIUM PHOSPHATES 15 MMOLE/5ML IV SOLN
30.0000 mmol | Freq: Once | INTRAVENOUS | Status: AC
  Administered 2021-09-29: 30 mmol via INTRAVENOUS
  Filled 2021-09-29: qty 10

## 2021-09-29 MED ORDER — LABETALOL HCL 5 MG/ML IV SOLN
10.0000 mg | Freq: Four times a day (QID) | INTRAVENOUS | Status: DC | PRN
  Administered 2021-09-30: 10 mg via INTRAVENOUS
  Filled 2021-09-29 (×2): qty 4

## 2021-09-29 MED ORDER — SODIUM CHLORIDE 0.9 % IV SOLN
90.0000 mg | Freq: Once | INTRAVENOUS | Status: AC
  Administered 2021-09-29: 90 mg via INTRAVENOUS
  Filled 2021-09-29: qty 30

## 2021-09-29 MED ORDER — BOOST PLUS PO LIQD
237.0000 mL | Freq: Three times a day (TID) | ORAL | Status: DC
  Filled 2021-09-29: qty 237

## 2021-09-29 MED ORDER — HYDRALAZINE HCL 25 MG PO TABS
25.0000 mg | ORAL_TABLET | Freq: Three times a day (TID) | ORAL | Status: DC
  Administered 2021-09-29 (×2): 25 mg via ORAL
  Filled 2021-09-29 (×2): qty 1

## 2021-09-29 MED ORDER — LABETALOL HCL 5 MG/ML IV SOLN
5.0000 mg | Freq: Four times a day (QID) | INTRAVENOUS | Status: DC | PRN
Start: 2021-09-29 — End: 2021-09-29
  Administered 2021-09-29: 5 mg via INTRAVENOUS
  Filled 2021-09-29: qty 4

## 2021-09-29 MED ORDER — IPRATROPIUM-ALBUTEROL 0.5-2.5 (3) MG/3ML IN SOLN: 3.0000 mL | Freq: Four times a day (QID) | RESPIRATORY_TRACT | Status: DC | PRN

## 2021-09-29 NOTE — Progress Notes (Signed)
At [facility], pt requested from this nurse to call  her because she wanted to speak with him before he sleeps. Around 0250, pt husband arrived and at bedside. ?

## 2021-09-29 NOTE — Progress Notes (Signed)
?PROGRESS NOTE ? ? ? ? ?[name], is a 76 y.o. female, DOB - [birth year], OIZ:[id number] ? ?Admit date - 09/27/2021   Admitting Physician Truett Mainland, DO ? ?Outpatient Primary MD for the patient is Glenda Chroman, MD ? ?LOS - 2 ? ?Chief Complaint  ?Patient presents with  ? Loss of Consciousness  ?    ? ? ?Brief Narrative:  ? 76 y.o. 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 ?  ?-Assessment and Plan: ? ?1)CAP/Pneumonia with acute hypoxic respiratory failure --- currently requiring 2 L of oxygen via nasal cannula ?_Pt does Not have Oxygen at home ?-Continue Rocephin/azithromycin bronchodilators and mucolytics ?Continue bronchodilators ? ?2)Syncope----Echo with EF of 72% and grade 1 diastolic dysfunction, no significant wall motion normalities, no significant aortic stenosis or mitral stenosis ?-On telemetry monitored unit no significant arrhythmias at this time ? ?3)Small right-sided subdural hematoma--- stable clinically and per serial CT head ?-Apparently EDP discussed with on-call neurosurgeon who recommends outpatient follow-up ? ?4)Hypercalcemia--query dehydration related in the setting of calcium supplementation ?-PTH WNL at 16 ?- Vitamin D/Calcitriol levels 76.9 (WNL) ?Serum Calcium 14.5 >>> 10.4 (Albumin 3.0) ?C/n IVF  ?09/29/21--Give Pamidronate on 09/29/21 ?- if fails to improve consider malignancy work-up ?-TSH 2.4 ? ?5)HypoNatremia----sodium normalized with IV fluids ?-This appears to be a recurrent/chronic problem patient previously treated with sodium chloride tablets ? ?6)Hypokalemia/Hypophosphatemia/Hypomagnesemia --- replace and recheck ? ?7)AKI----acute kidney injury due to dehydration ?- creatinine on admission= 2.28 ,   ?baseline creatinine = 0.7 to 0.8   , creatinine is now= 1.55,  ?--renally adjust medications, avoid nephrotoxic agents / dehydration  / hypotension ?--Renal function improving with hydration ? ?Disposition/Need for in-Hospital Stay- patient unable to be discharged at this time due to --hypercalcemia requiring further hydration and possible biphosphonate's, intracranial hemorrhage requiring further observation/monitoring ?-Possible discharge home in 24 to 48 hours if improves, may need home health PT ? ?Status is: Inpatient  ? ?Disposition: The patient is from: Home ?             Anticipated d/c is to: Home ?             Anticipated d/c date is: 1 day ?             Patient currently is not medically stable to d/c. ?Barriers: Not Clinically Stable-  ? ?Code Status :  -  Code Status: Full Code  ? ?Family Communication:    (patient is alert, awake and coherent)  ?-Husband at bedside ? ?DVT Prophylaxis  :   - SCDs    ? ?Lab Results  ?Component Value Date  ? PLT 254 09/28/2021  ? ?Inpatient Medications ? ?Scheduled Meds: ? diltiazem  180 mg Oral Daily  ? feeding supplement  237 mL Oral BID BM  ? hydrALAZINE  25 mg Oral TID  ? ipratropium-albuterol  3 mL Nebulization TID  ? isosorbide mononitrate  30 mg Oral Daily  ? labetalol  20 mg Intravenous Once  ? lactose free nutrition  237 mL Oral TID WC  ? levothyroxine  75 mcg Oral QAC breakfast  ? multivitamin with minerals  1 tablet Oral Daily  ? pregabalin  200 mg Oral QHS  ? Rotigotine  1 mg Transdermal Daily  ? sodium chloride  1 g Oral  TID  ? ?Continuous Infusions: ? sodium chloride 100 mL/hr at 09/29/21 0500  ? azithromycin 500 mg (09/28/21 2102)  ? cefTRIAXone (ROCEPHIN)  IV 2 g (09/28/21 1808)  ? potassium PHOSPHATE IVPB (in mmol) 30 mmol (09/29/21 1511)  ? ?PRN Meds:.labetalol, ondansetron **OR** ondansetron (ZOFRAN) IV ? ? ?Anti-infectives (From admission, onward)  ? ? Start     Dose/Rate Route Frequency Ordered Stop  ? 09/28/21 2000  azithromycin (ZITHROMAX) 500 mg  in sodium chloride 0.9 % 250 mL IVPB       ? 500 mg ?250 mL/hr over 60 Minutes Intravenous Every 24 hours 09/27/21 1939 10/02/21 1959  ? 09/28/21 1900  cefTRIAXone (ROCEPHIN) 2 g in sodium chloride 0.9 % 100 mL IVPB       ? 2 g ?200 mL/hr over 30 Minutes Intravenous Every 24 hours 09/27/21 1939 10/02/21 1859  ? 09/27/21 1845  cefTRIAXone (ROCEPHIN) 2 g in sodium chloride 0.9 % 100 mL IVPB       ? 2 g ?200 mL/hr over 30 Minutes Intravenous  Once 09/27/21 1834 09/27/21 1946  ? 09/27/21 1845  azithromycin (ZITHROMAX) 500 mg in sodium chloride 0.9 % 250 mL IVPB       ? 500 mg ?250 mL/hr over 60 Minutes Intravenous  Once 09/27/21 1834 09/27/21 2117  ? ?  ? ?  ?Subjective: ?[name] today has no fevers, no emesis,  No chest pain, no acute distress ?  ?-Her friends are at bedside---(female and female friend)- ?Cough and dyspnea is not worse ?Hypoxia improving ? ? ?Objective: ?Vitals:  ? 09/29/21 0800 09/29/21 0818 09/29/21 1354 09/29/21 1434  ?BP:  (!) 158/92  (!) 158/73  ?Pulse:  89  75  ?Resp:    19  ?Temp:    97.7 ?F (36.5 ?C)  ?TempSrc:    Oral  ?SpO2: 90% 94% 90% 95%  ?Weight:      ?Height:      ? ? ?Intake/Output Summary (Last 24 hours) at 09/29/2021 1513 ?Last data filed at 09/29/2021 0900 ?Gross per 24 hour  ?Intake 2274.63 ml  ?Output 1400 ml  ?Net 874.63 ml  ? ?Filed Weights  ? 09/27/21 1604  ?Weight: 62.1 kg  ? ? ?Physical Exam ? ?Gen:- Awake Alert, in no acute distress ?HEENT:- [place].AT, No sclera icterus ?Nose- [place] 2L/min ?Neck-Supple Neck,No JVD,.  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 ?Data Reviewed: I have personally reviewed following labs and imaging studies ? ?CBC: ?Recent Labs  ?Lab  09/27/21 ?1628 09/28/21 ?0403  ?WBC 11.6* 10.6*  ?HGB 11.0* 11.3*  ?HCT 31.8* 33.6*  ?MCV 93.0 93.3  ?PLT 247 254  ? ?Basic Metabolic Panel: ?Recent Labs  ?Lab 09/27/21 ?1628 09/28/21 ?0403 09/29/21 ?0503  ?NA 134* 138 139  ?K 3.1* 2.8* 3.1*  ?CL 99 106 111  ?CO2 29 24 21*  ?GLUCOSE 119* 99 114*  ?BUN 32* 27* 23  ?CREATININE 2.28* 1.83* 1.55*  ?CALCIUM 14.5* 12.2* 10.4*  ?MG  --   --  1.6*  ?PHOS  --   --  1.7*  ? ?GFR: ?Estimated Creatinine Clearance: 27.1 mL/min (A) (by C-G formula based on SCr of 1.55 mg/dL (H)). ?Liver  Function Tests: ?Recent Labs  ?Lab 09/27/21 ?1624 09/28/21 ?0403 09/29/21 ?0503  ?AST 23 19  --   ?ALT 23 20  --   ?ALKPHOS 91 80  --   ?BILITOT 0.9 0.8  --   ?PROT 6.8 5.9*  --   ?ALBUMIN 3.5 3.0* 3.0*  ? ?Cardiac Enzymes: ?No results for input(s): CKTOTAL, CKMB, CKMBINDEX, TROPONINI in the last 168 hours. ?BNP (last 3 results) ?No results for input(s): PROBNP in the last 8760 hours. ?HbA1C: ?No results for input(s): HGBA1C in the last 72 hours. ?Sepsis Labs: ?'@LABRCNTIP'$ (procalcitonin:4,lacticidven:4) ?)No results found for this or any previous visit (from the past 240 hour(s)).  ? ? ?Radiology Studies: ?DG Chest 1 View ? ?Result Date: 09/27/2021 ?CLINICAL DATA:  Syncopal episode EXAM: CHEST  1 VIEW COMPARISON:  Chest x-ray dated July 30, 2021 FINDINGS: Visualized cardiac and mediastinal contours are within normal limits. Small bilateral pleural effusions. Bibasilar opacities. No evidence of pneumothorax. IMPRESSION: 1. Small bilateral pleural effusions. 2. Bibasilar opacities which are likely due to atelectasis, aspiration or infection could appear similar. Electronically Signed   By: Yetta Glassman M.D.   On: 09/27/2021 16:48  ? ?CT HEAD WO CONTRAST (5MM) ? ?Result Date: 09/28/2021 ?CLINICAL DATA:  Follow-up subdural hematoma. EXAM: CT HEAD WITHOUT CONTRAST TECHNIQUE: Contiguous axial images were obtained from the base of the skull through the vertex without intravenous contrast. RADIATION DOSE  REDUCTION: This exam was performed according to the departmental dose-optimization program which includes automated exposure control, adjustment of the mA and/or kV according to patient size and/or use o

## 2021-09-29 NOTE — Progress Notes (Signed)
Initial Nutrition Assessment ? ?DOCUMENTATION CODES:  ? ?Not applicable ? ?INTERVENTION:  ?- Liberalize diet from a heart healthy to a regular diet to provide widest variety of menu options to enhance nutritional adequacy ? ?- Chopped meat diet modification placed in Health Touch to aid in ease of chewing and swallowing difficulty ? ?- Recommend SLP evaluation ? ?- Boost Plus po TID, each supplement provides 360 kcal and 14 grams of protein. ? ?- MVI with minerals daily ? ?NUTRITION DIAGNOSIS:  ? ?Increased nutrient needs related to acute illness as evidenced by estimated needs. ? ?GOAL:  ? ?Patient will meet greater than or equal to 90% of their needs ? ?MONITOR:  ? ?PO intake, Supplement acceptance, Diet advancement, Labs, Weight trends ? ?REASON FOR ASSESSMENT:  ? ?Malnutrition Screening Tool ?  ? ?ASSESSMENT:  ? ?Pt admitted with [facility] after syncopal episode and found to have PNA. 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 ?  ?Pt sitting in chair with lunch tray on table. She states that she does not typically eat meat or bread d/t ongoing swallowing difficulty which she describes as the food just sits in the back of her throat. She reports that after chewing her meat really well, even after swallowing she has left over residue in her mouth that she is unable to swallow. Reached out to MD for SLP consultation to evaluate. In the meantime, she agrees to chopped meats as this may help with ease of eating meats. At home she was eating 3 meals per day. Breakfast she typically has eggs and oatmeal, lunch consists of leftovers, and dinner is her biggest meal which she reports eating vegetables and pasta. Occasionally she may have peanut butter or [place] roll ups. She has tried Boost during a prior admission and is agreeable to receive them during  admission.  ? ?Meal Completion:  ?05/07: 50%- breakfast and dinner ?05/08: 50%-breakfast ? ?Pt is unsure of her usual weight or if she has had recent weight loss. Reviewed weight history. She has had a gradual weight loss of 6% within the last year which is not clinically significant for time frame. Will continue to monitor throughout admission.  ? ?Medications: synthroid, sodium chloride tablet 1g TID ?IV drips: NaCl @ 157m/hr, zithromax, rocephin ? ?Labs: potassium 3.1 (replacing), Cr 1.55, ionized Ca 8.5, phos 1.7 (replacing), Mg 1.6 (replacing), GFR 35 ?  ?NUTRITION - FOCUSED PHYSICAL EXAM: ? ?Flowsheet Row Most Recent Value  ?Orbital Region No depletion  ?Upper Arm Region No depletion  ?Thoracic and Lumbar Region No depletion  ?Buccal Region No depletion  ?Temple Region No depletion  ?Clavicle Bone Region No depletion  ?Clavicle and Acromion Bone Region No depletion  ?Scapular Bone Region No depletion  ?Dorsal Hand No depletion  [L arm amp]  ?Patellar Region No depletion  ?Anterior Thigh Region No depletion  ?Posterior Calf Region No depletion  ?Edema (RD Assessment) None  ?Hair Reviewed  ?Eyes Reviewed  ?Mouth Reviewed  ?Skin Reviewed  ?Nails Reviewed  ? ?  ? ? ?Diet Order:   ?Diet Order   ? ?       ?  Diet regular Room service appropriate? Yes; Fluid consistency: Thin  Diet effective now       ?  ? ?  ?  ? ?  ? ? ?EDUCATION NEEDS:  ? ?Education needs have been addressed ? ?Skin:  Skin Assessment: Reviewed  RN Assessment ? ?Last BM:  5/6 ? ?Height:  ? ?Ht Readings from Last 1 Encounters:  ?09/27/21 '5\' 4"'$  (1.626 m)  ? ? ?Weight:  ? ?Wt Readings from Last 1 Encounters:  ?09/27/21 62.1 kg  ? ?BMI:  Body mass index is 23.52 kg/m?. ? ?Estimated Nutritional Needs:  ? ?Kcal:  1550-1750 ? ?Protein:  75-90g ? ?Fluid:  >/=1.5L ?Clayborne Dana, RDN, LDN ?Clinical Nutrition ?

## 2021-09-29 NOTE — TOC Progression Note (Signed)
?  Transition of Care (TOC) Screening Note ? ? ?Patient Details  ?Name: [name] ?Date of Birth: 10-27-1945 ? ? ?Transition of Care (TOC) CM/SW Contact:    ?Boneta Lucks, RN ?Phone Number: ?09/29/2021, 9:59 AM ? ? ? ?Transition of Care Department [facility]) has reviewed patient and no TOC needs have been identified at this time. We will continue to monitor patient advancement through interdisciplinary progression rounds. If new patient transition needs arise, please place a TOC consult. ? ? ? ?  ?Barriers to Discharge: Continued Medical Work up ? ?

## 2021-09-30 ENCOUNTER — Inpatient Hospital Stay (HOSPITAL_COMMUNITY): Payer: Medicare PPO

## 2021-09-30 ENCOUNTER — Encounter (HOSPITAL_COMMUNITY): Payer: Self-pay | Admitting: Emergency Medicine

## 2021-09-30 DIAGNOSIS — R55 Syncope and collapse: Secondary | ICD-10-CM | POA: Diagnosis not present

## 2021-09-30 DIAGNOSIS — N179 Acute kidney failure, unspecified: Secondary | ICD-10-CM | POA: Diagnosis not present

## 2021-09-30 DIAGNOSIS — J90 Pleural effusion, not elsewhere classified: Secondary | ICD-10-CM | POA: Diagnosis present

## 2021-09-30 DIAGNOSIS — J189 Pneumonia, unspecified organism: Secondary | ICD-10-CM | POA: Diagnosis not present

## 2021-09-30 LAB — RENAL FUNCTION PANEL
Albumin: 3 g/dL — ABNORMAL LOW (ref 3.5–5.0)
Anion gap: 7 (ref 5–15)
BUN: 23 mg/dL (ref 8–23)
CO2: 21 mmol/L — ABNORMAL LOW (ref 22–32)
Calcium: 9.4 mg/dL (ref 8.9–10.3)
Chloride: 113 mmol/L — ABNORMAL HIGH (ref 98–111)
Creatinine, Ser: 1.45 mg/dL — ABNORMAL HIGH (ref 0.44–1.00)
GFR, Estimated: 38 mL/min — ABNORMAL LOW (ref >60)
Glucose, Bld: 118 mg/dL — ABNORMAL HIGH (ref 70–99)
Phosphorus: 3.3 mg/dL (ref 2.5–4.6)
Potassium: 3.7 mmol/L (ref 3.5–5.1)
Sodium: 141 mmol/L (ref 135–145)

## 2021-09-30 LAB — GRAM STAIN

## 2021-09-30 LAB — BODY FLUID CELL COUNT WITH DIFFERENTIAL
Lymphs, Fluid: 63 %
Monocyte-Macrophage-Serous Fluid: 18 % — ABNORMAL LOW (ref 50–90)
Neutrophil Count, Fluid: 19 % (ref 0–25)
Total Nucleated Cell Count, Fluid: 2086 cu mm — ABNORMAL HIGH (ref 0–1000)

## 2021-09-30 LAB — GLUCOSE, PLEURAL OR PERITONEAL FLUID: Glucose, Fluid: 114 mg/dL

## 2021-09-30 LAB — LACTATE DEHYDROGENASE, PLEURAL OR PERITONEAL FLUID: LD, Fluid: 67 U/L — ABNORMAL HIGH (ref 3–23)

## 2021-09-30 LAB — PROTEIN, PLEURAL OR PERITONEAL FLUID: Total protein, fluid: <3 g/dL

## 2021-09-30 MED ORDER — HYDRALAZINE HCL 20 MG/ML IJ SOLN
10.0000 mg | Freq: Four times a day (QID) | INTRAMUSCULAR | Status: DC | PRN
  Administered 2021-09-30: 10 mg via INTRAVENOUS
  Filled 2021-09-30: qty 1

## 2021-09-30 MED ORDER — AZITHROMYCIN 250 MG PO TABS
500.0000 mg | ORAL_TABLET | Freq: Every day | ORAL | Status: AC
  Administered 2021-09-30 – 2021-10-01 (×2): 500 mg via ORAL
  Filled 2021-09-30 (×2): qty 2

## 2021-09-30 NOTE — Progress Notes (Signed)
Patient's blood pressure remains elevated this shift. Notified MD. New prn medication ordered and given.  ? ?

## 2021-09-30 NOTE — Procedures (Signed)
PreOperative Dx: RIGHT pleural effusion ?Postoperative Dx: RIGHT pleural effusion ?Procedure:   US guided RIGHT thoracentesis ?Radiologist:  Thornton Papas ?Anesthesia:  10 ml of 1% lidocaine ?Specimen:  1,3 L of yellow colored fluid ?EBL:   < 1 ml ?Complications:  None   ?

## 2021-09-30 NOTE — Progress Notes (Signed)
?PROGRESS NOTE ? ? ? ? ?[name], is a 76 y.o. female, DOB - July 15, 1945, JJO:[id number] ? ?Admit date - 09/27/2021   Admitting Physician No admitting provider for patient encounter. ? ?Outpatient Primary MD for the patient is Glenda Chroman, MD ? ?LOS - 3 ? ?Chief Complaint  ?Patient presents with  ? Loss of Consciousness  ?    ? ? ?Brief Narrative:  ? 76 y.o. 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 ?  ?-Assessment and Plan: ? ?1)CAP/Pneumonia with acute hypoxic respiratory failure/pleural effusion --- currently requiring 2 L of oxygen via nasal cannula ?_Pt does Not have Oxygen at home ?-Status post right-sided thoracentesis on 09/30/2021 with removal of 1.2 L of pleural fluid ?-Please follow-up pleural fluid analysis studies, repeat chest x-ray on 10/01/2021 ?-Continue Rocephin/azithromycin bronchodilators and mucolytics ?Continue bronchodilators ? ?2)Syncope----Echo with EF of 72% and grade 1 diastolic dysfunction, no significant wall motion normalities, no significant aortic stenosis or mitral stenosis ?-On telemetry monitored unit no significant arrhythmias at this time ? ?3)Small right-sided subdural hematoma--- stable clinically and per serial CT head ?-Apparently EDP discussed with on-call neurosurgeon who recommends outpatient follow-up ? ?4)Hypercalcemia--query dehydration related in the setting of calcium supplementation prior to admission ?-PTH WNL at 16 ?- Vitamin D/Calcitriol levels 76.9 (WNL) ?Serum Calcium 14.5 >>> 10.4 >>9.4 (Albumin 3.0) ?Stopped IVF  ?09/29/21--received pamidronate on 09/29/21 ?- if fails to improve consider malignancy work-up ?-TSH 2.4 ? ?5)HypoNatremia----sodium normalized with IV fluids ?-This appears to  be a recurrent/chronic problem patient previously treated with sodium chloride tablets ? ?6)Hypokalemia/Hypophosphatemia/Hypomagnesemia --- replace and recheck ? ?7)AKI----acute kidney injury due to dehydration ?- creatinine on admission= 2.28 ,  ?baseline creatinine = 0.7 to 0.8   , creatinine is now= 1.45,  ?--renally adjust medications, avoid nephrotoxic agents / dehydration  / hypotension ?--Renal function improving with hydration ? ?Disposition/Need for in-Hospital Stay- patient unable to be discharged at this time due to --acute hypoxic respiratory failure requiring IV antibiotics and thoracentesis ?-, intracranial hemorrhage requiring further observation/monitoring ?-Possible discharge home in 24 to 48 hours if improves, may need home health PT ?-Repeat chest x-ray pending a.m. ? ?Status is: Inpatient  ? ?Disposition: The patient is from: Home ?             Anticipated d/c is to: Home ?             Anticipated d/c date is: 1 day ?             Patient currently is not medically stable to d/c. ?Barriers: Not Clinically Stable-  ? ?Code Status :  -  Code Status: Full Code  ? ?Family Communication:    (patient is alert, awake and coherent)  ?-Husband at bedside ? ?DVT Prophylaxis  :   - SCDs    ? ?Lab Results  ?Component Value Date  ? PLT 254 09/28/2021  ? ?Inpatient Medications ? ?Scheduled Meds: ? azithromycin  500 mg Oral Daily  ? diltiazem  180 mg Oral Daily  ? feeding supplement  237 mL Oral BID BM  ? hydrALAZINE  50 mg Oral TID  ? ipratropium-albuterol  3 mL Nebulization TID  ? isosorbide mononitrate  30 mg Oral Daily  ? labetalol  20 mg Intravenous Once  ? lactose free nutrition  237 mL Oral TID WC  ? levothyroxine  75 mcg Oral QAC breakfast  ? multivitamin with minerals  1 tablet Oral Daily  ? pregabalin  200 mg Oral QHS  ? Rotigotine  1 mg Transdermal Daily  ? sodium chloride  1 g Oral TID  ? ?Continuous Infusions: ? cefTRIAXone (ROCEPHIN)  IV 200 mL/hr at 09/30/21 1751  ? ?PRN Meds:.hydrALAZINE,  ipratropium-albuterol, labetalol, ondansetron **OR** ondansetron (ZOFRAN) IV ? ?Anti-infectives (From admission, onward)  ? ? Start     Dose/Rate Route Frequency Ordered Stop  ? 09/30/21 2000  azithromycin (ZITHROMAX) tablet 500 mg       ? 500 mg Oral Daily 09/30/21 1003 10/02/21 1959  ? 09/28/21 2000  azithromycin (ZITHROMAX) 500 mg in sodium chloride 0.9 % 250 mL IVPB  Status:  Discontinued       ? 500 mg ?250 mL/hr over 60 Minutes Intravenous Every 24 hours 09/27/21 1939 09/30/21 1003  ? 09/28/21 1900  cefTRIAXone (ROCEPHIN) 2 g in sodium chloride 0.9 % 100 mL IVPB       ? 2 g ?200 mL/hr over 30 Minutes Intravenous Every 24 hours 09/27/21 1939 10/02/21 1859  ? 09/27/21 1845  cefTRIAXone (ROCEPHIN) 2 g in sodium chloride 0.9 % 100 mL IVPB       ? 2 g ?200 mL/hr over 30 Minutes Intravenous  Once 09/27/21 1834 09/27/21 1946  ? 09/27/21 1845  azithromycin (ZITHROMAX) 500 mg in sodium chloride 0.9 % 250 mL IVPB       ? 500 mg ?250 mL/hr over 60 Minutes Intravenous  Once 09/27/21 1834 09/27/21 2117  ? ?  ? ? Subjective: ?Vonetta Rodden today has no fevers,   No chest pain, no acute distress ?  ?-Hypoxia cough and dyspnea improving after right-sided thoracentesis on 09/30/2021 ? ?No Nausea, Vomiting or Diarrhea ? ?Objective: ?Vitals:  ? 09/30/21 1406 09/30/21 1450 09/30/21 1511 09/30/21 1545  ?BP: (!) 156/71 (!) 153/83 136/71 137/69  ?Pulse: 80 90 97 81  ?Resp: '20 20 20   '$ ?Temp: 98.2 ?F (36.8 ?C) 98.2 ?F (36.8 ?C)    ?TempSrc: Oral     ?SpO2: 95% 92% 92% 95%  ?Weight:      ?Height:      ? ? ?Intake/Output Summary (Last 24 hours) at 09/30/2021 1819 ?Last data filed at 09/30/2021 1751 ?Gross per 24 hour  ?Intake 1718.34 ml  ?Output 1250 ml  ?Net 468.34 ml  ? ?Filed Weights  ? 09/27/21 1604  ?Weight: 62.1 kg  ? ? ?Physical Exam ? ?Gen:- Awake Alert, in no acute distress ?HEENT:- [place].AT, No sclera icterus ?Nose- [place] 2L/min ?Neck-Supple Neck,No JVD,.  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 ?Data Reviewed: I have personally reviewed following labs and imaging studies ? ?CBC: ?Recent Labs  ?Lab 09/27/21 ?1628 09/28/21 ?0403  ?WBC 11.6* 10.6*  ?HGB 11.0* 11.3*  ?HCT 31.8* 33.6*  ?MCV 93.0 93.3  ?PLT 247 254  ? ?Basic Metabolic Panel: ?Recent Labs  ?Lab 09/27/21 ?1628 09/28/21 ?0403 09/29/21 ?0503 09/30/21 ?0522  ?NA 134* 138 139 141  ?K 3.1* 2.8* 3.1* 3.7  ?CL 99 106 111 113*  ?  CO2 29 24 21* 21*  ?GLUCOSE 119* 99 114* 118*  ?BUN 32* 27* 23 23  ?CREATININE 2.28* 1.83* 1.55* 1.45*  ?CALCIUM 14.5* 12.2* 10.4* 9.4  ?MG  --   --  1.6*  --   ?PHOS  --   --  1.7* 3.3  ? ?GFR: ?Estimated Creatinine Clearance: 28.9 mL/min (A) (by C-G formula based on SCr of 1.45 mg/dL (H)). ?Liver Function Tests: ?Recent Labs  ?Lab 09/27/21 ?1624 09/28/21 ?0403 09/29/21 ?0503 09/30/21 ?0522  ?AST 23 19  --   --   ?ALT 23 20  --   --   ?ALKPHOS 91 80  --   --   ?BILITOT 0.9 0.8  --   --   ?PROT 6.8 5.9*  --   --   ?ALBUMIN 3.5 3.0* 3.0* 3.0*  ? ?Cardiac Enzymes: ?No results for input(s): CKTOTAL, CKMB, CKMBINDEX, TROPONINI in the last 168 hours. ?BNP (last 3 results) ?No results for input(s): PROBNP in the last 8760 hours. ?HbA1C: ?No results for input(s): HGBA1C in the last 72 hours. ?Sepsis Labs: ?'@LABRCNTIP'$ (procalcitonin:4,lacticidven:4) ?) ?Recent Results (from the past 240 hour(s))  ?Culture, body fluid w Gram Stain-bottle     Status: None (Preliminary result)  ? Collection Time: 09/30/21  3:10 PM  ? Specimen: Pleura  ?Result Value Ref Range Status  ? Specimen Description PLEURAL RIGHT  Final  ? Special Requests   Final  ?  BOTTLES DRAWN AEROBIC AND ANAEROBIC Blood Culture adequate volume ?Performed at  [facility], [address], [place], [place] [address] ?  ? Culture PENDING  Incomplete  ? Report Status PENDING  Incomplete  ?Gram stain     Status: None  ? Collection Time: 09/30/21  3:10 PM  ? Specimen

## 2021-09-30 NOTE — Progress Notes (Signed)
?   09/29/21 2050  ?Assess: MEWS Score  ?Temp 98 ?F (36.7 ?C)  ?BP (!) 176/56  ?Pulse Rate 86  ?Resp (!) 26  ?SpO2 93 %  ?O2 Device Nasal Cannula  ?O2 Flow Rate (L/min) 1 L/min  ?Assess: MEWS Score  ?MEWS Temp 0  ?MEWS Systolic 0  ?MEWS Pulse 0  ?MEWS RR 2  ?MEWS LOC 0  ?MEWS Score 2  ?MEWS Score Color Yellow  ?Assess: if the MEWS score is Yellow or Red  ?Were vital signs taken at a resting state? Yes  ?Focused Assessment No change from prior assessment  ?Early Detection of Sepsis Score *See Row Information* Low  ?MEWS guidelines implemented *See Row Information* Yes  ?Treat  ?MEWS Interventions Consulted Respiratory Therapy  ?Take Vital Signs  ?Increase Vital Sign Frequency  Yellow: Q 2hr X 2 then Q 4hr X 2, if remains yellow, continue Q 4hrs  ?Escalate  ?MEWS: Escalate Yellow: discuss with charge nurse/RN and consider discussing with provider and RRT  ?Notify: Charge Nurse/RN  ?Name of Charge Nurse/RN Notified Deeann Dowse, RN  ?Date Charge Nurse/RN Notified 09/29/21  ?Time Charge Nurse/RN Notified 2050  ? ? ?

## 2021-09-30 NOTE — Evaluation (Signed)
Clinical/Bedside Swallow Evaluation ?Patient Details  ?Name: [name] ?MRN: [id number] ?Date of Birth: 07/17/1945 ? ?Today's Date: 09/30/2021 ?Time: SLP Start Time (ACUTE ONLY): 4132 SLP Stop Time (ACUTE ONLY): 4401 ?SLP Time Calculation (min) (ACUTE ONLY): 14 min ? ?Past Medical History:  ?Past Medical History:  ?Diagnosis Date  ? Dysphasia   ? Endometrial cancer ([address])   ? Heel spur   ? Heparin induced thrombocytopenia (HCC)   ? Hypercholesteremia   ? Hypertension   ? Hypothyroidism   ? Migraine headache   ? Osteopenia   ? RLS (restless legs syndrome)   ? SVC syndrome   ? Thyroid disease   ? ?Past Surgical History:  ?Past Surgical History:  ?Procedure Laterality Date  ? ABDOMINAL HYSTERECTOMY    ? ANKLE SURGERY Right   ? 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 FINGER AMPUTATION    ? right index finger at knuckle  ? PORT-A-CATH REMOVAL    ? TONSILLECTOMY    ? ?HPI:  ?76 y.o. 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  ?Assessment / Plan / Recommendation  ?Clinical Impression ? Clinical swallowing evaluation completed while Pt was sitting upright in chair. Pt reports difficulty with mastication and swallowing meats and breads and reports she just avoids them. Pt consumed regular, puree and thin liquids without overt s/sx of aspiration. SLP reviewed universal aspiration precautions and provided education regarding continuing to avoid tough/problematic textures. Recommend continue with regular diet and thin liquids. Meds are ok whole with liquids. There are no further ST needs at this time, ST will sign off. thank you, ?SLP Visit Diagnosis: Dysphagia, unspecified (R13.10) ?   ?Aspiration Risk ? Mild aspiration risk  ?  ?Diet Recommendation  Regular;Thin liquid  ? ?Liquid Administration via: Cup;Straw ?Medication Administration: Whole meds with liquid ?Supervision: Patient able to self feed;Intermittent supervision to cue for compensatory strategies ?Compensations: Minimize environmental distractions;Slow rate ?Postural Changes: Seated upright at 90 degrees;Remain upright for at least 30 minutes after po intake  ?  ?Other  Recommendations Oral Care Recommendations: Oral care BID   ? ?Recommendations for follow up therapy are one component of a multi-disciplinary discharge planning process, led by the attending physician.  Recommendations may be updated based on patient status, additional functional criteria and insurance authorization. ? ?Follow up Recommendations No SLP follow up  ? ? ?  ? ? ?Swallow Study   ?General Date of Onset: 09/27/21 ?HPI: 76 y.o. 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 and chronic  hyponatremia admitted on 09/27/2021 with ICH after syncopal episode and found to have pneumonia ?Type of Study: Bedside Swallow Evaluation ?Previous Swallow Assessment: none in chart ?Diet Prior to this Study: Regular;Thin liquids ?Temperature Spikes Noted: No ?Respiratory Status: Room air ?History of Recent Intubation: No ?Behavior/Cognition: Alert;Cooperative;Pleasant mood ?Oral Cavity Assessment: Within Functional Limits ?Vision: Functional for self-feeding ?Self-Feeding Abilities: Able to feed self ?Patient Positioning: Upright in bed;Upright in chair ?Baseline Vocal Quality: Normal ?Volitional Cough: Strong ?Volitional Swallow: Able to elicit  ?  ?Oral/Motor/Sensory Function Overall Oral Motor/Sensory Function: Within functional limits   ?Ice Chips Ice chips: Within functional limits   ?Thin Liquid Thin Liquid: Within functional limits  ?  ?  Nectar Thick Nectar Thick  Liquid: Not tested   ?Honey Thick Honey Thick Liquid: Not tested   ?Puree Puree: Within functional limits   ?Solid ? ? ?  Solid: Within functional limits  ? ?  ?Eldin Bonsell H. Izora Ribas MA, CCC-SLP ?Speech Language Pathologist ? ?Wende Bushy ?09/30/2021,10:41 AM ? ? ? ?

## 2021-09-30 NOTE — Progress Notes (Signed)
?   09/30/21 0058  ?Assess: MEWS Score  ?Temp 97.8 ?F (36.6 ?C)  ?BP (!) 213/69  ?Pulse Rate 84  ?Resp (!) 22  ?SpO2 95 %  ?O2 Device Nasal Cannula  ?O2 Flow Rate (L/min) 2.5 L/min  ?Assess: MEWS Score  ?MEWS Temp 0  ?MEWS Systolic 2  ?MEWS Pulse 0  ?MEWS RR 1  ?MEWS LOC 0  ?MEWS Score 3  ?MEWS Score Color Yellow  ?Assess: if the MEWS score is Yellow or Red  ?Were vital signs taken at a resting state? Yes  ?Focused Assessment No change from prior assessment  ?Early Detection of Sepsis Score *See Row Information* Low  ?MEWS guidelines implemented *See Row Information* Yes  ?Treat  ?MEWS Interventions Administered prn meds/treatments  ?Pain Scale 0-10  ?Pain Score 0  ?Notify: Charge Nurse/RN  ?Name of Charge Nurse/RN Notified Deeann Dowse, RN  ?Date Charge Nurse/RN Notified 09/30/21  ?Time Charge Nurse/RN Notified 0050  ? ?PRN Labetalol '10mg'$  given IV for blood pressure ?

## 2021-09-30 NOTE — Progress Notes (Signed)
Patient tolerated right sided thoracentesis procedure well today and 1300 mL of clear yellow fluid removed with labs collected and and sent for processing. Chest xray completed post procedure and read by radiologist at bedside. PT verbalized understanding of post procedure instructions and taken back to inpatient bed assignment at this time via stretcher with no acute distress noted.  ?

## 2021-10-01 ENCOUNTER — Inpatient Hospital Stay (HOSPITAL_COMMUNITY): Payer: Medicare PPO

## 2021-10-01 DIAGNOSIS — E871 Hypo-osmolality and hyponatremia: Secondary | ICD-10-CM | POA: Diagnosis not present

## 2021-10-01 DIAGNOSIS — J189 Pneumonia, unspecified organism: Secondary | ICD-10-CM | POA: Diagnosis not present

## 2021-10-01 DIAGNOSIS — N179 Acute kidney failure, unspecified: Secondary | ICD-10-CM | POA: Diagnosis not present

## 2021-10-01 LAB — RENAL FUNCTION PANEL
Albumin: 2.7 g/dL — ABNORMAL LOW (ref 3.5–5.0)
Anion gap: 7 (ref 5–15)
BUN: 25 mg/dL — ABNORMAL HIGH (ref 8–23)
CO2: 19 mmol/L — ABNORMAL LOW (ref 22–32)
Calcium: 9 mg/dL (ref 8.9–10.3)
Chloride: 113 mmol/L — ABNORMAL HIGH (ref 98–111)
Creatinine, Ser: 1.66 mg/dL — ABNORMAL HIGH (ref 0.44–1.00)
GFR, Estimated: 32 mL/min — ABNORMAL LOW (ref >60)
Glucose, Bld: 96 mg/dL (ref 70–99)
Phosphorus: 2.8 mg/dL (ref 2.5–4.6)
Potassium: 3.3 mmol/L — ABNORMAL LOW (ref 3.5–5.1)
Sodium: 139 mmol/L (ref 135–145)

## 2021-10-01 LAB — CBC
HCT: 29.6 % — ABNORMAL LOW (ref 36.0–46.0)
Hemoglobin: 9.8 g/dL — ABNORMAL LOW (ref 12.0–15.0)
MCH: 31.4 pg (ref 26.0–34.0)
MCHC: 33.1 g/dL (ref 30.0–36.0)
MCV: 94.9 fL (ref 80.0–100.0)
Platelets: 269 10*3/uL (ref 150–400)
RBC: 3.12 MIL/uL — ABNORMAL LOW (ref 3.87–5.11)
RDW: 14.4 % (ref 11.5–15.5)
WBC: 9.8 10*3/uL (ref 4.0–10.5)
nRBC: 0 % (ref 0.0–0.2)

## 2021-10-01 MED ORDER — GUAIFENESIN ER 600 MG PO TB12
600.0000 mg | ORAL_TABLET | Freq: Two times a day (BID) | ORAL | Status: DC
  Administered 2021-10-01 – 2021-10-02 (×2): 600 mg via ORAL
  Filled 2021-10-01 (×2): qty 1

## 2021-10-01 NOTE — Progress Notes (Signed)
?PROGRESS NOTE ? ? ? ? ?[name], is a 76 y.o. female, DOB - 11-13-1945, GEZ:[id number] ? ?Admit date - 09/27/2021   Admitting Physician No admitting provider for patient encounter. ? ?Outpatient Primary MD for the patient is Glenda Chroman, MD ? ?LOS - 4 ? ?Chief Complaint  ?Patient presents with  ? Loss of Consciousness  ?    ? ? ?Brief Narrative:  ? 77 y.o. 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 ?  ?-Assessment and Plan: ? ?1)CAP/Pneumonia with acute hypoxic respiratory failure/pleural effusion --- currently requiring 2 L of oxygen via nasal cannula ?_Pt does Not have Oxygen at home ?-Status post right-sided thoracentesis on 09/30/2021 with removal of 1.2 L of pleural fluid ?-Fluid analysis indicates transudative process, although was noted to have elevated WBC count ?-May be parapneumonic in the setting of underlying infection ?-Cytology currently in process ?-Continue Rocephin/azithromycin bronchodilators and mucolytics ?Continue bronchodilators ?-We will likely need repeat chest x-ray in 3 to 4 weeks to ensure resolution ? ?2)Syncope----Echo with EF of 72% and grade 1 diastolic dysfunction, no significant wall motion normalities, no significant aortic stenosis or mitral stenosis ?-On telemetry monitored unit no significant arrhythmias at this time ?-Suspect this may be related to volume depletion ? ?3)Small right-sided subdural hematoma--- stable clinically and per serial CT head ?-Apparently EDP discussed with on-call neurosurgeon who recommends outpatient follow-up ? ?4)Hypercalcemia--query dehydration related in the setting of calcium supplementation prior to admission ?-PTH WNL at 16 ?-PTH related peptide currently in  process ?- Vitamin D/Calcitriol levels 76.9 (WNL) ?Serum Calcium 14.5 >>> 10.4 >>9.4 (Albumin 3.0) ?Stopped IVF  ?09/29/21--received pamidronate on 09/29/21 ?- if fails to improve consider malignancy work-up ?-TSH 2.4 ? ?5)HypoNatremia----sodium normalized with IV fluids ?-This appears to be a recurrent/chronic problem patient previously treated with sodium chloride tablets ? ?6)Hypokalemia/Hypophosphatemia/Hypomagnesemia --- replace and recheck ? ?7)AKI----acute kidney injury due to dehydration ?- creatinine on admission= 2.28 ,  ?baseline creatinine = 0.7 to 0.8   , creatinine is now= 1.66  ?--renally adjust medications, avoid nephrotoxic agents / dehydration  / hypotension ?--Renal function improving with hydration ? ?Disposition/Need for in-Hospital Stay- patient unable to be discharged at this time due to --acute hypoxic respiratory failure requiring IV antibiotics and thoracentesis ?-, intracranial hemorrhage requiring further observation/monitoring ?-Possible discharge home in 24 to 48 hours if improves, may need home health PT ?-Repeat chest x-ray shows persistent small bilateral pleural effusions ? ?Status is: Inpatient  ? ?Disposition: The patient is from: Home ?             Anticipated d/c is to: Home ?             Anticipated d/c date is: 1 day ?             Patient currently is not medically stable to d/c. ?Barriers: Not Clinically Stable-  ? ?Code Status :  -  Code Status: Full Code  ? ?Family Communication:    (patient is alert, awake and coherent)  ?-Husband at bedside ? ?DVT Prophylaxis  :   - SCDs    ? ?Lab Results  ?Component Value Date  ? PLT 269 10/01/2021  ? ?Inpatient Medications ? ?Scheduled Meds: ? azithromycin  500 mg Oral Daily  ? diltiazem  180 mg Oral Daily  ?  feeding supplement  237 mL Oral BID BM  ? guaiFENesin  600 mg Oral BID  ? hydrALAZINE  50 mg Oral TID  ? ipratropium-albuterol  3 mL Nebulization TID  ? isosorbide mononitrate  30 mg Oral Daily  ? lactose free nutrition  237 mL Oral  TID WC  ? levothyroxine  75 mcg Oral QAC breakfast  ? multivitamin with minerals  1 tablet Oral Daily  ? pregabalin  200 mg Oral QHS  ? Rotigotine  1 mg Transdermal Daily  ? ?Continuous Infusions: ? ? ?PRN Meds:.hydrALAZINE, ipratropium-albuterol, labetalol, ondansetron **OR** ondansetron (ZOFRAN) IV ? ?Anti-infectives (From admission, onward)  ? ? Start     Dose/Rate Route Frequency Ordered Stop  ? 09/30/21 2000  azithromycin (ZITHROMAX) tablet 500 mg       ? 500 mg Oral Daily 09/30/21 1003 10/02/21 1959  ? 09/28/21 2000  azithromycin (ZITHROMAX) 500 mg in sodium chloride 0.9 % 250 mL IVPB  Status:  Discontinued       ? 500 mg ?250 mL/hr over 60 Minutes Intravenous Every 24 hours 09/27/21 1939 09/30/21 1003  ? 09/28/21 1900  cefTRIAXone (ROCEPHIN) 2 g in sodium chloride 0.9 % 100 mL IVPB       ? 2 g ?200 mL/hr over 30 Minutes Intravenous Every 24 hours 09/27/21 1939 10/01/21 1913  ? 09/27/21 1845  cefTRIAXone (ROCEPHIN) 2 g in sodium chloride 0.9 % 100 mL IVPB       ? 2 g ?200 mL/hr over 30 Minutes Intravenous  Once 09/27/21 1834 09/27/21 1946  ? 09/27/21 1845  azithromycin (ZITHROMAX) 500 mg in sodium chloride 0.9 % 250 mL IVPB       ? 500 mg ?250 mL/hr over 60 Minutes Intravenous  Once 09/27/21 1834 09/27/21 2117  ? ?  ? ? Subjective: ?Overall she is feeling better. ?Continues to have cough, but is not productive ?Feels that breathing improved after thoracentesis yesterday ? ?Objective: ?Vitals:  ? 10/01/21 0823 10/01/21 1405 10/01/21 1658 10/01/21 1938  ?BP: (!) 162/64 132/71 138/85   ?Pulse:  76    ?Resp:  18    ?Temp:  98.7 ?F (37.1 ?C)    ?TempSrc:  Oral    ?SpO2:  95%  93%  ?Weight:      ?Height:      ? ? ?Intake/Output Summary (Last 24 hours) at 10/01/2021 1952 ?Last data filed at 10/01/2021 8366 ?Gross per 24 hour  ?Intake 864 ml  ?Output 1200 ml  ?Net -336 ml  ? ?Filed Weights  ? 09/27/21 1604  ?Weight: 62.1 kg  ? ? ?Physical Exam ? ?General exam: Alert, awake, oriented x 3 ?Respiratory system: Coarse  breath sounds at bases. Respiratory effort normal. ?Cardiovascular system:RRR. No murmurs, rubs, gallops. ?Gastrointestinal system: Abdomen is nondistended, soft and nontender. No organomegaly or masses felt. Normal bowel sounds heard. ?Central nervous system: Alert and oriented. No focal neurological deficits. ?Extremities: No C/C/E, +pedal pulses ?Skin: No rashes, lesions or ulcers ?Psychiatry: Judgement and insight appear normal. Mood & affect appropriate.  ? ? ?Data Reviewed: I have personally reviewed following labs and imaging studies ? ?CBC: ?Recent Labs  ?Lab 09/27/21 ?1628 09/28/21 ?0403 10/01/21 ?2947  ?WBC 11.6* 10.6* 9.8  ?HGB 11.0* 11.3* 9.8*  ?HCT 31.8* 33.6* 29.6*  ?MCV 93.0 93.3 94.9  ?PLT 247 254 269  ? ?Basic Metabolic Panel: ?Recent Labs  ?Lab 09/27/21 ?1628 09/28/21 ?0403 09/29/21 ?0503 09/30/21 ?0522 10/01/21 ?6546  ?NA 134* 138 139 141 139  ?K 3.1* 2.8*  3.1* 3.7 3.3*  ?CL 99 106 111 113* 113*  ?CO2 29 24 21* 21* 19*  ?GLUCOSE 119* 99 114* 118* 96  ?BUN 32* 27* 23 23 25*  ?CREATININE 2.28* 1.83* 1.55* 1.45* 1.66*  ?CALCIUM 14.5* 12.2* 10.4* 9.4 9.0  ?MG  --   --  1.6*  --   --   ?PHOS  --   --  1.7* 3.3 2.8  ? ?GFR: ?Estimated Creatinine Clearance: 25.3 mL/min (A) (by C-G formula based on SCr of 1.66 mg/dL (H)). ?Liver Function Tests: ?Recent Labs  ?Lab 09/27/21 ?1624 09/28/21 ?0403 09/29/21 ?0503 09/30/21 ?0522 10/01/21 ?3159  ?AST 23 19  --   --   --   ?ALT 23 20  --   --   --   ?ALKPHOS 91 80  --   --   --   ?BILITOT 0.9 0.8  --   --   --   ?PROT 6.8 5.9*  --   --   --   ?ALBUMIN 3.5 3.0* 3.0* 3.0* 2.7*  ? ?Cardiac Enzymes: ?No results for input(s): CKTOTAL, CKMB, CKMBINDEX, TROPONINI in the last 168 hours. ?BNP (last 3 results) ?No results for input(s): PROBNP in the last 8760 hours. ?HbA1C: ?No results for input(s): HGBA1C in the last 72 hours. ?Sepsis Labs: ?'@LABRCNTIP'$ (procalcitonin:4,lacticidven:4) ?) ?Recent Results (from the past 240 hour(s))  ?Culture, body fluid w Gram Stain-bottle      Status: None (Preliminary result)  ? Collection Time: 09/30/21  3:10 PM  ? Specimen: Pleura  ?Result Value Ref Range Status  ? Specimen Description PLEURAL RIGHT  Final  ? Special Requests   Final  ?  BOTTLES DRAW

## 2021-10-01 NOTE — TOC Progression Note (Addendum)
Transition of Care (TOC) - Progression Note  ? ? ?Patient Details  ?Name: Jezelle Gullick Schnyder ?MRN: [id number] ?Date of Birth: Jan 09, 1946 ? ?Transition of Care (TOC) CM/SW Contact  ?Salome Arnt, LCSW ?Phone Number: ?10/01/2021, 12:51 PM ? ?Clinical Narrative:  PT recommending home health at d/c. Discussed with pt who is agreeable and requests [facility] as she has used them before. Marjory Lies with [facility] accepted referral. Will need HHPT order.    ? ?Addendum: Pt will require home O2. Sat qualification note in. MD notified for orders. Discussed with pt who is agreeable to refer to [facility]. Referral made to [facility]. Anticipate d/c tomorrow per MD.  ? ? ?  ?Barriers to Discharge: Continued Medical Work up ? ?Expected Discharge Plan and Services ?  ?  ?  ?  ?  ?                ?  ?  ?  ?  ?  ?HH Arranged: PT ?[place] Agency: [place] ?Date HH Agency Contacted: 10/01/21 ?Time [place]: 1250 ?Representative spoke with at [place]: Marjory Lies ? ? ?Social Determinants of Health (SDOH) Interventions ?  ? ?Readmission Risk Interventions ?   ? View : No data to display.  ?  ?  ?  ? ? ?

## 2021-10-01 NOTE — Evaluation (Signed)
Physical Therapy Evaluation ?Patient Details ?Name: [name] ?MRN: [id number] ?DOB: [birth year] ?Today's Date: 10/01/2021 ? ?History of Present Illness ? [name] is a 76 y.o. 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  She also has a history of chronic hyponatremia and she has been admitted to the hospital for this, including on 07/30/21.  During that time she was discharged on sodium tablets.  Patient presents with syncopal episode she was taking a walk on the road.  EMS was called and transported her to the hospital.  It is unknown for how long she was unconscious.  Here, she was noted to be hypoxic and she admits to feeling short of breath.  She had an equivocal cough.  No fevers, chills, nausea, vomiting.  Appetite has been mildly decreased. ?  ?Clinical Impression ? Patient functioning near baseline for functional mobility and gait demonstrating good return for bed mobility, transfers, slightly labored movement with decreased right arm swing initially during gait training, but improved after verbal cues and ambulated without loss of balance in room/hallways.  Patient tolerated staying up in chair after therapy.  Patient will benefit from continued skilled physical therapy in hospital and recommended venue below to increase strength, balance, endurance for safe ADLs and gait.  ? ?   ?   ? ?Recommendations for follow up therapy are one component of a multi-disciplinary discharge planning process, led by the attending physician.  Recommendations may be updated based on patient status, additional functional criteria and insurance authorization. ? ?Follow Up Recommendations Home health PT ? ?  ?Assistance Recommended at Discharge Set up Supervision/Assistance  ?Patient can return home with the following ? A little  help with walking and/or transfers;A little help with bathing/dressing/bathroom;Help with stairs or ramp for entrance;Assistance with cooking/housework ? ?  ?Equipment Recommendations Kasandra Knudsen  ?Recommendations for Other Services ?    ?  ?Functional Status Assessment Patient has had a recent decline in their functional status and demonstrates the ability to make significant improvements in function in a reasonable and predictable amount of time.  ? ?  ?Precautions / Restrictions Precautions ?Precautions: Fall ?Restrictions ?Weight Bearing Restrictions: No  ? ?  ? ?Mobility ? Bed Mobility ?Overal bed mobility: Modified Independent ?  ?  ?  ?  ?  ?  ?  ?  ? ?Transfers ?Overall transfer level: Needs assistance ?Equipment used: None ?Transfers: Sit to/from Stand, Bed to chair/wheelchair/BSC ?Sit to Stand: Supervision ?  ?Step pivot transfers: Supervision ?  ?  ?  ?General transfer comment: slightly labored movement ?  ? ?Ambulation/Gait ?Ambulation/Gait assistance: Min guard ?Gait Distance (Feet): 100 Feet ?Assistive device: None, 1 person hand held assist ?Gait Pattern/deviations: Decreased step length - right, Decreased step length - left, Decreased stride length, Drifts right/left ?Gait velocity: decreased ?  ?  ?General Gait Details: slightly labored cadence with decreased right arm swing, occasional drifting lift/right with no loss of balance, limited mostly due to fatigue on room air with SpO2 at 90-95% ? ?Stairs ?  ?  ?  ?  ?  ? ?Wheelchair Mobility ?  ? ?Modified Rankin (Stroke Patients Only) ?  ? ?  ? ?Balance Overall balance assessment: Needs assistance ?Sitting-balance support: Feet supported, No upper extremity supported ?Sitting balance-Leahy Scale: Good ?Sitting balance - Comments: seated at EOB ?  ?Standing balance support: During functional activity, No upper extremity supported ?  Standing balance-Leahy Scale: Fair ?Standing balance comment: without AD ?  ?  ?  ?  ?  ?  ?  ?  ?  ?  ?  ?   ? ? ? ?Pertinent  Vitals/Pain Pain Assessment ?Pain Assessment: No/denies pain  ? ? ?Home Living Family/patient expects to be discharged to:: Private residence ?Living Arrangements: Children ?Available Help at Discharge: Family;Available PRN/intermittently ?Type of Home: House ?Home Access: Ramped entrance ?  ?  ?  ?Home Layout: One level ?Home Equipment: Shower seat;BSC/3in1;Shower seat - built in ?   ?  ?Prior Function Prior Level of Function : Needs assist ?  ?  ?  ?Physical Assist : Mobility (physical);ADLs (physical) ?Mobility (physical): Bed mobility;Transfers;Gait;Stairs ?  ?Mobility Comments: household ambulator usually leaning on furniture, walls ?ADLs Comments: assisted by family ?  ? ? ?Hand Dominance  ? Dominant Hand: Right ? ?  ?Extremity/Trunk Assessment  ? Upper Extremity Assessment ?Upper Extremity Assessment: Overall WFL for tasks assessed ?  ? ?Lower Extremity Assessment ?Lower Extremity Assessment: Generalized weakness ?  ? ?Cervical / Trunk Assessment ?Cervical / Trunk Assessment: Normal  ?Communication  ? Communication: No difficulties  ?Cognition Arousal/Alertness: Awake/alert ?Behavior During Therapy: [facility] for tasks assessed/performed ?Overall Cognitive Status: Within Functional Limits for tasks assessed ?  ?  ?  ?  ?  ?  ?  ?  ?  ?  ?  ?  ?  ?  ?  ?  ?  ?  ?  ? ?  ?General Comments   ? ?  ?Exercises    ? ?Assessment/Plan  ?  ?PT Assessment Patient needs continued PT services  ?PT Problem List Decreased strength;Decreased activity tolerance;Decreased balance;Decreased mobility ? ?   ?  ?PT Treatment Interventions DME instruction;Gait training;Stair training;Functional mobility training;Therapeutic activities;Therapeutic exercise;Patient/family education;Balance training   ? ?PT Goals (Current goals can be found in the Care Plan section)  ?Acute Rehab PT Goals ?Patient Stated Goal: return home with family to assist ?PT Goal Formulation: With patient ?Time For Goal Achievement: 10/04/21 ?Potential to Achieve  Goals: Good ? ?  ?Frequency Min 3X/week ?  ? ? ?Co-evaluation   ?  ?  ?  ?  ? ? ?  ?AM-PAC PT "6 Clicks" Mobility  ?Outcome Measure Help needed turning from your back to your side while in a flat bed without using bedrails?: None ?Help needed moving from lying on your back to sitting on the side of a flat bed without using bedrails?: None ?Help needed moving to and from a bed to a chair (including a wheelchair)?: A Little ?Help needed standing up from a chair using your arms (e.g., wheelchair or bedside chair)?: A Little ?Help needed to walk in hospital room?: A Little ?Help needed climbing 3-5 steps with a railing? : A Little ?6 Click Score: 20 ? ?  ?End of Session   ?Activity Tolerance: Patient tolerated treatment well;Patient limited by fatigue ?Patient left: in chair;with call bell/phone within reach ?Nurse Communication: Mobility status ?PT Visit Diagnosis: Unsteadiness on feet (R26.81);Other abnormalities of gait and mobility (R26.89);Muscle weakness (generalized) (M62.81) ?  ? ?Time: 6301-6010 ?PT Time Calculation (min) (ACUTE ONLY): 21 min ? ? ?Charges:   PT Evaluation ?$PT Eval Moderate Complexity: 1 Mod ?PT Treatments ?$Therapeutic Activity: 8-22 mins ?  ?   ? ? ?12:04 PM, 10/01/21 ?Lonell Grandchild, MPT ?Physical Therapist with [place] ?[facility] ?[phone] office ?[phone] mobile phone ? ? ?

## 2021-10-01 NOTE — Plan of Care (Signed)
?  Problem: Acute Rehab PT Goals(only PT should resolve) ?Goal: Pt Will Go Supine/Side To Sit ?Outcome: Progressing ?Flowsheets (Taken 10/01/2021 1207) ?Pt will go Supine/Side to Sit: ? Independently ? with modified independence ?Goal: Patient Will Transfer Sit To/From Stand ?Outcome: Progressing ?Flowsheets (Taken 10/01/2021 1207) ?Patient will transfer sit to/from stand: ? with modified independence ? with supervision ?Goal: Pt Will Transfer Bed To Chair/Chair To Bed ?Outcome: Progressing ?Flowsheets (Taken 10/01/2021 1207) ?Pt will Transfer Bed to Chair/Chair to Bed: ? with modified independence ? with supervision ?Goal: Pt Will Ambulate ?Outcome: Progressing ?Flowsheets (Taken 10/01/2021 1207) ?Pt will Ambulate: ? > 125 feet ? with modified independence ? with supervision ? with least restrictive assistive device ?  ?12:07 PM, 10/01/21 ?Lonell Grandchild, MPT ?Physical Therapist with [place] ?[facility] ?[phone] office ?[phone] mobile phone ? ?

## 2021-10-01 NOTE — Progress Notes (Signed)
SATURATION QUALIFICATIONS: (This note is used to comply with regulatory documentation for home oxygen) ? ?Patient Saturations on Room Air at Rest = 90% ? ?Patient Saturations on Room Air while Ambulating = 86% ? ?Patient Saturations on 2 Liters of oxygen while Ambulating = 90% ? ?Please briefly explain why patient needs home oxygen: patient's oxygen level dropped to 86% on RA with ambulation. ?

## 2021-10-02 DIAGNOSIS — J189 Pneumonia, unspecified organism: Secondary | ICD-10-CM | POA: Diagnosis not present

## 2021-10-02 DIAGNOSIS — J90 Pleural effusion, not elsewhere classified: Secondary | ICD-10-CM | POA: Diagnosis not present

## 2021-10-02 DIAGNOSIS — E871 Hypo-osmolality and hyponatremia: Secondary | ICD-10-CM | POA: Diagnosis not present

## 2021-10-02 LAB — BASIC METABOLIC PANEL
Anion gap: 5 (ref 5–15)
BUN: 27 mg/dL — ABNORMAL HIGH (ref 8–23)
CO2: 21 mmol/L — ABNORMAL LOW (ref 22–32)
Calcium: 8.9 mg/dL (ref 8.9–10.3)
Chloride: 114 mmol/L — ABNORMAL HIGH (ref 98–111)
Creatinine, Ser: 1.45 mg/dL — ABNORMAL HIGH (ref 0.44–1.00)
GFR, Estimated: 38 mL/min — ABNORMAL LOW (ref >60)
Glucose, Bld: 99 mg/dL (ref 70–99)
Potassium: 3.3 mmol/L — ABNORMAL LOW (ref 3.5–5.1)
Sodium: 140 mmol/L (ref 135–145)

## 2021-10-02 MED ORDER — ALBUTEROL SULFATE HFA 108 (90 BASE) MCG/ACT IN AERS: 2.0000 | INHALATION_SPRAY | Freq: Four times a day (QID) | RESPIRATORY_TRACT | 2 refills | Status: DC | PRN

## 2021-10-02 MED ORDER — ISOSORBIDE MONONITRATE ER 30 MG PO TB24: 30.0000 mg | ORAL_TABLET | Freq: Every day | ORAL | 1 refills | Status: DC

## 2021-10-02 MED ORDER — GUAIFENESIN ER 600 MG PO TB12: 600.0000 mg | ORAL_TABLET | Freq: Two times a day (BID) | ORAL | 0 refills | Status: DC

## 2021-10-02 MED ORDER — CEPHALEXIN 500 MG PO CAPS
500.0000 mg | ORAL_CAPSULE | Freq: Three times a day (TID) | ORAL | 0 refills | Status: AC
Start: 2021-10-02 — End: 2021-10-07

## 2021-10-02 MED ORDER — HYDRALAZINE HCL 50 MG PO TABS: 50.0000 mg | ORAL_TABLET | Freq: Three times a day (TID) | ORAL | 1 refills | Status: DC

## 2021-10-02 MED ORDER — POTASSIUM CHLORIDE CRYS ER 20 MEQ PO TBCR
40.0000 meq | EXTENDED_RELEASE_TABLET | ORAL | Status: AC
  Administered 2021-10-02 (×2): 40 meq via ORAL
  Filled 2021-10-02 (×2): qty 2

## 2021-10-02 MED ORDER — IPRATROPIUM-ALBUTEROL 0.5-2.5 (3) MG/3ML IN SOLN: 3.0000 mL | Freq: Two times a day (BID) | RESPIRATORY_TRACT | Status: DC

## 2021-10-02 NOTE — Discharge Summary (Addendum)
Physician Discharge Summary  ?[name] MBT:[id number] DOB: 06/11/1945 DOA: 09/27/2021 ? ?PCP: Glenda Chroman, MD ? ?Admit date: 09/27/2021 ?Discharge date: 10/02/2021 ? ?Admitted From: Home ?Disposition: Home ? ?Recommendations for Outpatient Follow-up:  ?Follow up with PCP in 1-2 weeks ?Please obtain BMP/CBC in one week ?Cytology from pleural fluid currently pending at the time of discharge ?Parathyroid related peptide in process at the time of discharge ?Patient will need repeat chest x-ray in 3 to 4 weeks ?Follow-up with neurosurgery, Dr. Reatha Armour in the next 2 weeks ? ?Home Health: Home health PT ?Equipment/Devices: Oxygen at 2 L ? ?Discharge Condition: Stable ?CODE STATUS: Full code ?Diet recommendation: Heart healthy ? ?Brief/Interim Summary: ? 76 y.o. 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 ?Discharge Diagnoses:  ?Principal Problem: ?  CAP (community acquired pneumonia) ?Active Problems: ?  Hypercalcemia ?  Subdural hematoma (HCC) ?  Pleural effusion-Rt >> Lt ?  Hyponatremia ?  Acute renal injury ([place]) ?  Syncopal episodes ? ?CAP/Pneumonia with acute hypoxic respiratory failure/pleural effusion --- currently requiring 2 L of oxygen via nasal cannula ?_Pt does Not have Oxygen at home ?-Status post right-sided thoracentesis on 09/30/2021 with removal of 1.2 L of pleural fluid ?-Fluid analysis indicates transudative process, although was noted to have elevated WBC count ?-May be parapneumonic in the setting of underlying infection ?-Cytology currently in process at the time of discharge ?-She was treated with Rocephin/azithromycin bronchodilators and mucolytics ?-Transition to oral antibiotics ?-We will likely need repeat chest x-ray in  3 to 4 weeks to ensure resolution ?  ?2)Syncope----Echo with EF of 72% and grade 1 diastolic dysfunction, no significant wall motion normalities, no significant aortic stenosis or mitral stenosis ?-On telemetry monitored unit no significant arrhythmias at this time ?-Suspect this may have been related to volume depletion ?  ?3)Small right-sided traumatic subdural hematoma--- stable clinically and per serial CT head ?-Apparently EDP discussed with on-call neurosurgeon who recommends outpatient follow-up ?  ?4)Hypercalcemia--query dehydration related in the setting of calcium supplementation prior to admission ?-PTH WNL at 16 ?-PTH related peptide currently in process ?- Vitamin D/Calcitriol levels 76.9 (WNL) ?Serum Calcium 14.5 >>> 10.4 >>9.4 (Albumin 3.0) ?Stopped IVF  ?09/29/21--received pamidronate on 09/29/21 ?-TSH 2.4 ?-Repeat basic metabolic panel in 1 week to ensure stability of calcium and renal function ?  ?5)HypoNatremia----sodium normalized with IV fluids ?-This appears to be a recurrent/chronic problem patient previously treated with sodium chloride tablets ?  ?6)Hypokalemia/Hypophosphatemia/Hypomagnesemia --- replace and recheck ?  ?7)AKI----acute kidney injury due to dehydration ?- creatinine on admission= 2.28 ,  ?baseline creatinine = 0.7 to 0.8   , creatinine is now= 1.45  ?--renally adjust medications, avoid nephrotoxic agents / dehydration  / hypotension ?--Renal function improving with hydration ? ?Discharge Instructions ? ?Discharge Instructions   ? ? Diet - low sodium heart healthy   Complete by: As directed ?  ? Increase activity slowly   Complete by: As directed ?  ? ?  ? ?Allergies as of 10/02/2021   ? ?   Reactions  ? Iodinated Contrast Media Hives, Rash  ? Atorvastatin Calcium Other (See Comments)  ? Musculoskeletal aches ?Other reaction(s): Other (See Comments) ?Musculoskeletal aches  ? Doxycycline Other (See Comments)  ? Liver problems ?Other reaction(s): Other (See Comments) ?Liver problems   ?  Heparin Other (See Comments)  ? Causes blood clots. ?Other reaction(s): Other (See Comments) ?Causes blood clots.  ? Lisinopril Other (See Comments)  ? cough ?Other reaction(s): Other (See Comments) ?cough  ? Lovastatin Diarrhea  ? Ropinirole Hcl Nausea Only  ? ?  ? ?  ?Medication List  ?  ? ?TAKE these medications   ? ?albuterol 108 (90 Base) MCG/ACT inhaler ?Commonly known as: VENTOLIN HFA ?Inhale 2 puffs into the lungs every 6 (six) hours as needed for wheezing or shortness of breath. ?  ?ALIGN PO ?Take 1 tablet by mouth daily. ?  ?b complex vitamins tablet ?Take 2 tablets by mouth daily. ?  ?cephALEXin 500 MG capsule ?Commonly known as: KEFLEX ?Take 1 capsule (500 mg total) by mouth 3 (three) times daily for 5 days. ?  ?diltiazem 180 MG 24 hr capsule ?Commonly known as: CARDIZEM CD ?Take 180 mg by mouth daily. ?  ?guaiFENesin 600 MG 12 hr tablet ?Commonly known as: [place] ?Take 1 tablet (600 mg total) by mouth 2 (two) times daily. ?  ?hydrALAZINE 50 MG tablet ?Commonly known as: APRESOLINE ?Take 1 tablet (50 mg total) by mouth 3 (three) times daily. ?  ?isosorbide mononitrate 30 MG 24 hr tablet ?Commonly known as: IMDUR ?Take 1 tablet (30 mg total) by mouth daily. ?Start taking on: Oct 03, 2021 ?  ?levothyroxine 75 MCG tablet ?Commonly known as: SYNTHROID ?Take 75 mcg by mouth daily before breakfast. ?  ?Magnesium Malate 1250 (141.7 Mg) MG Tabs ?Take 1,250 mg by mouth. Two tablets daily. ?  ?Neupro 1 MG/24HR Pt24 ?Generic drug: Rotigotine ?Place 1 patch (1 mg total) onto the skin daily. ?  ?nitrofurantoin 100 MG capsule ?Commonly known as: MACRODANTIN ?Take 100 mg by mouth daily. ?  ?Potassium 99 MG Tabs ?Take 1 tablet by mouth 2 (two) times a day. ?  ?pregabalin 100 MG capsule ?Commonly known as: LYRICA ?TAKE 2 CAPSULES AT BEDTIME. MAY TAKE AN EXTRA ONE IF NEEDED. ?  ?sodium chloride 1 g tablet ?Take 1 g by mouth 3 (three) times daily. ?  ?UNABLE TO FIND ?Take 500 mg by mouth daily. Med Name: Beet Root ?   ?vitamin C 1000 MG tablet ?Take 1,000 mg by mouth 2 (two) times a day. ?  ? ?  ? ? Follow-up Information   ? ? [facility].   ?Specialty: Home Health Services ?Why: Will contact you to schedule home health visits. ?Contact information: ?[address] ?[address] ?[place] [address] ?[phone] ? ? ?  ?  ? ? Vyas, Dhruv B, MD. Schedule an appointment as soon as possible for a visit in 2 week(s).   ?Specialty: Internal Medicine ?Why: you will need repeat chest xray in 3-4 weeks ?Contact information: ?[address] ?[place] [address] ?[phone] ? ? ?  ?  ? ? [facility] Follow up.   ?Why: Home Oxygen ?Contact information: ?[address] ?[place] [address] ?[phone] ? ? ?  ?  ? ?  ?  ? ?  ? ?Allergies  ?Allergen Reactions  ? Iodinated Contrast Media Hives and Rash  ? Atorvastatin Calcium Other (See Comments)  ?  Musculoskeletal aches ?Other reaction(s): Other (See Comments) ?Musculoskeletal aches  ? Doxycycline Other (See Comments)  ?  Liver problems ?Other reaction(s): Other (See Comments) ?Liver problems  ? Heparin Other (See Comments)  ?  Causes blood clots. ?Other reaction(s): Other (See Comments) ?Causes blood clots.  ? Lisinopril Other (See Comments)  ?  cough ?Other reaction(s): Other (See Comments) ?cough  ? Lovastatin Diarrhea  ? Ropinirole Hcl Nausea Only  ? ? ?Consultations: ? ? ? ?Procedures/Studies: ?DG Chest 1 View ? ?Result Date: 09/30/2021 ?CLINICAL DATA:  RIGHT pleural effusion post thoracentesis EXAM: CHEST  1 VIEW COMPARISON:  Expiratory AP chest radiograph compared to earlier study of 09/30/2021 FINDINGS: Enlargement of cardiac silhouette. Mediastinal contours and pulmonary vascularity normal. Small bibasilar effusions and atelectasis, decreased on RIGHT since previous exam. No pneumothorax. Underlying abnormalities in the lower lungs not excluded. Osseous structures unremarkable. IMPRESSION: No pneumothorax following RIGHT thoracentesis. Electronically  Signed   By: Lavonia Dana M.D.   On: 09/30/2021 15:38  ? ?DG Chest 1 View ? ?Result Date: 09/27/2021 ?CLINICAL DATA:  Syncopal episode EXAM: CHEST  1 VIEW COMPARISON:  Chest x-ray dated July 30, 2021 FINDINGS: V

## 2021-10-02 NOTE — Care Management Important Message (Signed)
Important Message ? ?Patient Details  ?Name: [name] ?MRN: [id number] ?Date of Birth: June 06, 1945 ? ? ?Medicare Important Message Given:  Yes ? ? ? ? ?Tommy Medal ?10/02/2021, 3:04 PM ?

## 2021-10-02 NOTE — TOC Transition Note (Signed)
Transition of Care (TOC) - CM/SW Discharge Note ? ? ?Patient Details  ?Name: [name] ?MRN: [id number] ?Date of Birth: June 15, 1945 ? ?Transition of Care (TOC) CM/SW Contact:  ?Boneta Lucks, RN ?Phone Number: ?10/02/2021, 3:26 PM ? ? ?Clinical Narrative:   Patient discharging home. [employer] delivered home oxygen. Marjory Lies with [facility] updated with discharge plan, orders are in for oxygen and home health.  ? ?Final next level of care: [facility] ?Barriers to Discharge: Barriers Resolved ? ?Patient Goals and CMS Choice ?Patient states their goals for this hospitalization and ongoing recovery are:: to go home. ?CMS Medicare.gov Compare Post Acute Care list provided to:: Patient ?Choice offered to / list presented to : Patient ? ?Discharge Placement ?  ?          ?Patient and family notified of of transfer: 10/02/21 ? ?Discharge Plan and Services ?   ?HH Arranged: PT ?[facility] Agency: [facility] ?Date HH Agency Contacted: 10/01/21 ?Time [place]: 1250 ?Representative spoke with at [place]: Marjory Lies ? ?Readmission Risk Interventions ? ?  10/02/2021  ?  3:25 PM  ?Readmission Risk Prevention Plan  ?Transportation Screening Complete  ?PCP or Specialist Appt within 5-7 Days Complete  ?Home Care Screening Complete  ?Medication Review (RN CM) Complete  ? ? ? ? ? ?

## 2021-10-04 LAB — PTH-RELATED PEPTIDE: PTH-related peptide: <2 pmol/L

## 2021-10-05 LAB — CULTURE, BODY FLUID W GRAM STAIN -BOTTLE
Culture: NO GROWTH
Special Requests: ADEQUATE

## 2021-10-06 LAB — CYTOLOGY - NON PAP

## 2021-10-08 DIAGNOSIS — Z299 Encounter for prophylactic measures, unspecified: Secondary | ICD-10-CM | POA: Diagnosis not present

## 2021-10-08 DIAGNOSIS — I10 Essential (primary) hypertension: Secondary | ICD-10-CM | POA: Diagnosis not present

## 2021-10-08 DIAGNOSIS — J90 Pleural effusion, not elsewhere classified: Secondary | ICD-10-CM | POA: Diagnosis not present

## 2021-10-08 DIAGNOSIS — R600 Localized edema: Secondary | ICD-10-CM | POA: Diagnosis not present

## 2021-10-08 DIAGNOSIS — Z09 Encounter for follow-up examination after completed treatment for conditions other than malignant neoplasm: Secondary | ICD-10-CM | POA: Diagnosis not present

## 2021-10-14 ENCOUNTER — Emergency Department (HOSPITAL_COMMUNITY): Payer: Medicare PPO

## 2021-10-14 ENCOUNTER — Inpatient Hospital Stay (HOSPITAL_COMMUNITY)
Admission: EM | Admit: 2021-10-14 | Discharge: 2021-10-20 | DRG: 252 | Disposition: A | Discharge status: Home Health | Payer: Medicare PPO | Attending: Family Medicine | Admitting: Family Medicine

## 2021-10-14 ENCOUNTER — Other Ambulatory Visit: Payer: Self-pay

## 2021-10-14 ENCOUNTER — Encounter (HOSPITAL_COMMUNITY): Payer: Self-pay | Admitting: *Deleted

## 2021-10-14 DIAGNOSIS — T82898S Other specified complication of vascular prosthetic devices, implants and grafts, sequela: Secondary | ICD-10-CM

## 2021-10-14 DIAGNOSIS — R918 Other nonspecific abnormal finding of lung field: Secondary | ICD-10-CM | POA: Diagnosis not present

## 2021-10-14 DIAGNOSIS — I509 Heart failure, unspecified: Secondary | ICD-10-CM | POA: Diagnosis not present

## 2021-10-14 DIAGNOSIS — E7800 Pure hypercholesterolemia, unspecified: Secondary | ICD-10-CM | POA: Diagnosis not present

## 2021-10-14 DIAGNOSIS — I5033 Acute on chronic diastolic (congestive) heart failure: Secondary | ICD-10-CM | POA: Diagnosis present

## 2021-10-14 DIAGNOSIS — Z89212 Acquired absence of left upper limb below elbow: Secondary | ICD-10-CM

## 2021-10-14 DIAGNOSIS — J90 Pleural effusion, not elsewhere classified: Secondary | ICD-10-CM | POA: Diagnosis present

## 2021-10-14 DIAGNOSIS — Y712 Prosthetic and other implants, materials and accessory cardiovascular devices associated with adverse incidents: Secondary | ICD-10-CM | POA: Diagnosis present

## 2021-10-14 DIAGNOSIS — Z8542 Personal history of malignant neoplasm of other parts of uterus: Secondary | ICD-10-CM

## 2021-10-14 DIAGNOSIS — Z8701 Personal history of pneumonia (recurrent): Secondary | ICD-10-CM

## 2021-10-14 DIAGNOSIS — I700 Atherosclerosis of aorta: Secondary | ICD-10-CM | POA: Diagnosis not present

## 2021-10-14 DIAGNOSIS — Z6822 Body mass index (BMI) 22.0-22.9, adult: Secondary | ICD-10-CM

## 2021-10-14 DIAGNOSIS — I82210 Acute embolism and thrombosis of superior vena cava: Secondary | ICD-10-CM | POA: Diagnosis not present

## 2021-10-14 DIAGNOSIS — E876 Hypokalemia: Secondary | ICD-10-CM | POA: Diagnosis present

## 2021-10-14 DIAGNOSIS — J918 Pleural effusion in other conditions classified elsewhere: Secondary | ICD-10-CM | POA: Diagnosis not present

## 2021-10-14 DIAGNOSIS — I2609 Other pulmonary embolism with acute cor pulmonale: Secondary | ICD-10-CM | POA: Diagnosis not present

## 2021-10-14 DIAGNOSIS — L270 Generalized skin eruption due to drugs and medicaments taken internally: Secondary | ICD-10-CM | POA: Diagnosis not present

## 2021-10-14 DIAGNOSIS — I82443 Acute embolism and thrombosis of tibial vein, bilateral: Secondary | ICD-10-CM | POA: Diagnosis not present

## 2021-10-14 DIAGNOSIS — I871 Compression of vein: Secondary | ICD-10-CM | POA: Diagnosis not present

## 2021-10-14 DIAGNOSIS — Z79890 Hormone replacement therapy (postmenopausal): Secondary | ICD-10-CM | POA: Diagnosis not present

## 2021-10-14 DIAGNOSIS — E039 Hypothyroidism, unspecified: Secondary | ICD-10-CM | POA: Diagnosis present

## 2021-10-14 DIAGNOSIS — I10 Essential (primary) hypertension: Secondary | ICD-10-CM

## 2021-10-14 DIAGNOSIS — I6200 Nontraumatic subdural hemorrhage, unspecified: Secondary | ICD-10-CM | POA: Diagnosis not present

## 2021-10-14 DIAGNOSIS — R0902 Hypoxemia: Secondary | ICD-10-CM

## 2021-10-14 DIAGNOSIS — Z881 Allergy status to other antibiotic agents status: Secondary | ICD-10-CM | POA: Diagnosis not present

## 2021-10-14 DIAGNOSIS — Z79899 Other long term (current) drug therapy: Secondary | ICD-10-CM | POA: Diagnosis not present

## 2021-10-14 DIAGNOSIS — I82A11 Acute embolism and thrombosis of right axillary vein: Secondary | ICD-10-CM | POA: Diagnosis not present

## 2021-10-14 DIAGNOSIS — I5030 Unspecified diastolic (congestive) heart failure: Secondary | ICD-10-CM

## 2021-10-14 DIAGNOSIS — Z9981 Dependence on supplemental oxygen: Secondary | ICD-10-CM

## 2021-10-14 DIAGNOSIS — Z9181 History of falling: Secondary | ICD-10-CM

## 2021-10-14 DIAGNOSIS — Z90710 Acquired absence of both cervix and uterus: Secondary | ICD-10-CM

## 2021-10-14 DIAGNOSIS — S065XAA Traumatic subdural hemorrhage with loss of consciousness status unknown, initial encounter: Secondary | ICD-10-CM | POA: Diagnosis present

## 2021-10-14 DIAGNOSIS — Z888 Allergy status to other drugs, medicaments and biological substances status: Secondary | ICD-10-CM

## 2021-10-14 DIAGNOSIS — E8809 Other disorders of plasma-protein metabolism, not elsewhere classified: Secondary | ICD-10-CM | POA: Diagnosis present

## 2021-10-14 DIAGNOSIS — Z87820 Personal history of traumatic brain injury: Secondary | ICD-10-CM

## 2021-10-14 DIAGNOSIS — I110 Hypertensive heart disease with heart failure: Secondary | ICD-10-CM | POA: Diagnosis present

## 2021-10-14 DIAGNOSIS — I82413 Acute embolism and thrombosis of femoral vein, bilateral: Secondary | ICD-10-CM | POA: Diagnosis not present

## 2021-10-14 DIAGNOSIS — T508X5A Adverse effect of diagnostic agents, initial encounter: Secondary | ICD-10-CM | POA: Diagnosis not present

## 2021-10-14 DIAGNOSIS — I5031 Acute diastolic (congestive) heart failure: Secondary | ICD-10-CM

## 2021-10-14 DIAGNOSIS — I2699 Other pulmonary embolism without acute cor pulmonale: Secondary | ICD-10-CM | POA: Diagnosis present

## 2021-10-14 DIAGNOSIS — E871 Hypo-osmolality and hyponatremia: Secondary | ICD-10-CM | POA: Diagnosis present

## 2021-10-14 DIAGNOSIS — J9601 Acute respiratory failure with hypoxia: Secondary | ICD-10-CM

## 2021-10-14 DIAGNOSIS — Z86718 Personal history of other venous thrombosis and embolism: Secondary | ICD-10-CM

## 2021-10-14 DIAGNOSIS — D6959 Other secondary thrombocytopenia: Secondary | ICD-10-CM | POA: Diagnosis not present

## 2021-10-14 DIAGNOSIS — R9431 Abnormal electrocardiogram [ECG] [EKG]: Secondary | ICD-10-CM | POA: Diagnosis present

## 2021-10-14 DIAGNOSIS — J189 Pneumonia, unspecified organism: Secondary | ICD-10-CM | POA: Diagnosis not present

## 2021-10-14 DIAGNOSIS — Z862 Personal history of diseases of the blood and blood-forming organs and certain disorders involving the immune mechanism: Secondary | ICD-10-CM

## 2021-10-14 DIAGNOSIS — Z86711 Personal history of pulmonary embolism: Secondary | ICD-10-CM | POA: Diagnosis not present

## 2021-10-14 DIAGNOSIS — E441 Mild protein-calorie malnutrition: Secondary | ICD-10-CM | POA: Diagnosis not present

## 2021-10-14 DIAGNOSIS — D638 Anemia in other chronic diseases classified elsewhere: Secondary | ICD-10-CM | POA: Diagnosis not present

## 2021-10-14 DIAGNOSIS — J9811 Atelectasis: Secondary | ICD-10-CM | POA: Diagnosis not present

## 2021-10-14 DIAGNOSIS — Z9889 Other specified postprocedural states: Secondary | ICD-10-CM | POA: Diagnosis not present

## 2021-10-14 DIAGNOSIS — R0602 Shortness of breath: Secondary | ICD-10-CM | POA: Diagnosis not present

## 2021-10-14 LAB — CBC WITH DIFFERENTIAL/PLATELET
Abs Immature Granulocytes: 0.06 10*3/uL (ref 0.00–0.07)
Basophils Absolute: 0 10*3/uL (ref 0.0–0.1)
Basophils Relative: 0 %
Eosinophils Absolute: 0.1 10*3/uL (ref 0.0–0.5)
Eosinophils Relative: 1 %
HCT: 31.7 % — ABNORMAL LOW (ref 36.0–46.0)
Hemoglobin: 10.6 g/dL — ABNORMAL LOW (ref 12.0–15.0)
Immature Granulocytes: 1 %
Lymphocytes Relative: 6 %
Lymphs Abs: 0.6 10*3/uL — ABNORMAL LOW (ref 0.7–4.0)
MCH: 31.5 pg (ref 26.0–34.0)
MCHC: 33.4 g/dL (ref 30.0–36.0)
MCV: 94.3 fL (ref 80.0–100.0)
Monocytes Absolute: 0.5 10*3/uL (ref 0.1–1.0)
Monocytes Relative: 5 %
Neutro Abs: 8.8 10*3/uL — ABNORMAL HIGH (ref 1.7–7.7)
Neutrophils Relative %: 87 %
Platelets: 238 10*3/uL (ref 150–400)
RBC: 3.36 MIL/uL — ABNORMAL LOW (ref 3.87–5.11)
RDW: 14.5 % (ref 11.5–15.5)
WBC: 10.1 10*3/uL (ref 4.0–10.5)
nRBC: 0 % (ref 0.0–0.2)

## 2021-10-14 LAB — COMPREHENSIVE METABOLIC PANEL
ALT: 32 U/L (ref 0–44)
AST: 27 U/L (ref 15–41)
Albumin: 3.3 g/dL — ABNORMAL LOW (ref 3.5–5.0)
Alkaline Phosphatase: 153 U/L — ABNORMAL HIGH (ref 38–126)
Anion gap: 7 (ref 5–15)
BUN: 24 mg/dL — ABNORMAL HIGH (ref 8–23)
CO2: 25 mmol/L (ref 22–32)
Calcium: 8.2 mg/dL — ABNORMAL LOW (ref 8.9–10.3)
Chloride: 106 mmol/L (ref 98–111)
Creatinine, Ser: 1.03 mg/dL — ABNORMAL HIGH (ref 0.44–1.00)
GFR, Estimated: 57 mL/min — ABNORMAL LOW (ref >60)
Glucose, Bld: 107 mg/dL — ABNORMAL HIGH (ref 70–99)
Potassium: 3.5 mmol/L (ref 3.5–5.1)
Sodium: 138 mmol/L (ref 135–145)
Total Bilirubin: 0.6 mg/dL (ref 0.3–1.2)
Total Protein: 6.3 g/dL — ABNORMAL LOW (ref 6.5–8.1)

## 2021-10-14 LAB — BRAIN NATRIURETIC PEPTIDE: B Natriuretic Peptide: 43 pg/mL (ref 0.0–100.0)

## 2021-10-14 MED ORDER — FUROSEMIDE 10 MG/ML IJ SOLN: 20.0000 mg | Freq: Two times a day (BID) | INTRAMUSCULAR | Status: DC

## 2021-10-14 MED ORDER — FUROSEMIDE 10 MG/ML IJ SOLN
INTRAMUSCULAR | Status: AC
  Filled 2021-10-14: qty 2

## 2021-10-14 MED ORDER — ENSURE ENLIVE PO LIQD
237.0000 mL | Freq: Two times a day (BID) | ORAL | Status: DC
  Administered 2021-10-16 – 2021-10-20 (×3): 237 mL via ORAL

## 2021-10-14 MED ORDER — POTASSIUM CHLORIDE CRYS ER 20 MEQ PO TBCR
40.0000 meq | EXTENDED_RELEASE_TABLET | Freq: Once | ORAL | Status: AC
  Administered 2021-10-14: 40 meq via ORAL
  Filled 2021-10-14: qty 2

## 2021-10-14 MED ORDER — FUROSEMIDE 10 MG/ML IJ SOLN
40.0000 mg | Freq: Two times a day (BID) | INTRAMUSCULAR | Status: DC
Start: 2021-10-15 — End: 2021-10-17
  Administered 2021-10-15 – 2021-10-17 (×5): 40 mg via INTRAVENOUS
  Filled 2021-10-14 (×5): qty 4

## 2021-10-14 MED ORDER — LEVOTHYROXINE SODIUM 75 MCG PO TABS
75.0000 ug | ORAL_TABLET | Freq: Every day | ORAL | Status: DC
  Administered 2021-10-15 – 2021-10-20 (×6): 75 ug via ORAL
  Filled 2021-10-14 (×6): qty 1

## 2021-10-14 MED ORDER — FUROSEMIDE 10 MG/ML IJ SOLN
20.0000 mg | Freq: Once | INTRAMUSCULAR | Status: AC
  Administered 2021-10-14: 20 mg via INTRAVENOUS

## 2021-10-14 NOTE — ED Notes (Signed)
Pt returned from xray

## 2021-10-14 NOTE — H&P (Signed)
History and Physical    Patient: [name] LFY:[id number] DOB: Nov 28, 1945 DOA: 10/14/2021 DOS: the patient was seen and examined on 10/14/2021 PCP: Glenda Chroman, MD  Patient coming from: Home  Chief Complaint:  Chief Complaint  Patient presents with   Shortness of Breath   HPI: [name] is a 76 y.o. 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 Patient was admitted from 5/6 through 5/11 due to community acquired pneumonia with acute hypoxic respiratory failure and pleural effusion which required right-sided thoracentesis on 09/30/2021 with removal of 1.2 L of pleural fluid noted to be transudative in nature (considered to be parapneumonic in the setting of underlying infection), during hospitalization she was treated with IV Rocephin/azithromycin mucolytic's and bronchodilators and this was transitioned to oral antibiotics prior to discharge.  She also had syncope on that admission with echo revealing G1 DD. Patient now complains of worsening shortness of breath and increased work of breathing, she was discharged with supplemental oxygen at 2 LPM via [place], but patient has gradually increased this to 4 LPM.  She also complaining of increased leg swelling.  She had a follow-up with her PCP who prescribed furosemide, but there was some confusion regarding the dosing, so there was delay in patient starting the medication which was just started today.  She was seen by home health normal who asked her to go to the ED for further evaluation and management.  ED Course:  In the emergency department, she was tachypneic, BP was 153/89, O2 sat was 94-96% on supplemental oxygen via [place] at 4 LPM and  other vital signs were within normal range.  Work-up in the ED showed normocytic anemia, BMP was normal except for BUN of 24, creatinine was 1.03 (this ranged within 1.5-1.7 in the last 2 weeks). Chest x-ray (left decubitus) showed free-flowing left pleural effusion. Primarily free-flowing right pleural effusion. Portable chest x-ray showed moderate right-greater-than-left pleural effusions, increased in size when compared with prior exam.  Hospitalist was asked to admit patient for further evaluation and management.    Review of Systems: Review of systems as noted in the HPI. All other systems reviewed and are negative.   Past Medical History:  Diagnosis Date   Dysphasia    Endometrial cancer ([place])    Heel spur    Heparin induced thrombocytopenia (HCC)    Hypercholesteremia    Hypertension    Hypothyroidism    Migraine headache    Osteopenia    RLS (restless legs syndrome)    SVC syndrome    Thyroid disease    Past Surgical History:  Procedure Laterality Date   ABDOMINAL HYSTERECTOMY     ANKLE SURGERY Right    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     FINGER AMPUTATION     right index finger at knuckle   PORT-A-CATH REMOVAL     TONSILLECTOMY      Social History:  reports that she has never smoked. She has never used smokeless tobacco. She reports that she does not drink alcohol and does not use drugs.   Allergies  Allergen Reactions   Iodinated Contrast Media Hives and Rash   Atorvastatin Calcium Other (See Comments)    Musculoskeletal aches Other reaction(s): Other (See Comments) Musculoskeletal aches   Doxycycline Other (  See Comments)    Liver problems Other reaction(s): Other (See Comments) Liver problems   Heparin Other (See Comments)    Causes blood clots. Other reaction(s): Other (See Comments) Causes blood clots.   Lisinopril Other (See Comments)    cough Other reaction(s): Other (See Comments) cough   Lovastatin Diarrhea   Ropinirole Hcl Nausea Only    Family  History  Problem Relation Age of Onset   Stroke Mother    Transient ischemic attack Father    Dementia Father    Pneumonia Father    Diabetes Sister    Rheum arthritis Sister    Brain cancer Brother    Breast cancer Maternal Grandmother    Stroke Paternal Grandmother      Prior to Admission medications   Medication Sig Start Date End Date Taking? Authorizing Provider  albuterol (VENTOLIN HFA) 108 (90 Base) MCG/ACT inhaler Inhale 2 puffs into the lungs every 6 (six) hours as needed for wheezing or shortness of breath. 10/02/21   Kathie Dike, MD  Ascorbic Acid (VITAMIN C) 1000 MG tablet Take 1,000 mg by mouth 2 (two) times a day.    [provider]  b complex vitamins tablet Take 2 tablets by mouth daily.    [provider]  diltiazem (CARDIZEM CD) 180 MG 24 hr capsule Take 180 mg by mouth daily.    [provider]  guaiFENesin (MUCINEX) 600 MG 12 hr tablet Take 1 tablet (600 mg total) by mouth 2 (two) times daily. 10/02/21   Kathie Dike, MD  hydrALAZINE (APRESOLINE) 50 MG tablet Take 1 tablet (50 mg total) by mouth 3 (three) times daily. 10/02/21   Kathie Dike, MD  isosorbide mononitrate (IMDUR) 30 MG 24 hr tablet Take 1 tablet (30 mg total) by mouth daily. 10/03/21   Kathie Dike, MD  levothyroxine (SYNTHROID) 75 MCG tablet Take 75 mcg by mouth daily before breakfast.    [provider]  Magnesium Malate 1250 (141.7 Mg) MG TABS Take 1,250 mg by mouth. Two tablets daily.    [provider]  nitrofurantoin (MACRODANTIN) 100 MG capsule Take 100 mg by mouth daily.    [provider]  Potassium 99 MG TABS Take 1 tablet by mouth 2 (two) times a day.    [provider]  pregabalin (LYRICA) 100 MG capsule TAKE 2 CAPSULES AT BEDTIME. MAY TAKE AN EXTRA ONE IF NEEDED. 08/13/21   Suzzanne Cloud, NP  Probiotic Product (ALIGN PO) Take 1 tablet by mouth daily.    [provider]  Rotigotine (NEUPRO) 1 MG/24HR PT24 Place 1  patch (1 mg total) onto the skin daily. 08/13/21   Suzzanne Cloud, NP  sodium chloride 1 g tablet Take 1 g by mouth 3 (three) times daily. 08/26/21   [provider]  UNABLE TO FIND Take 500 mg by mouth daily. Med Name: Beet Root    [provider]    Physical Exam: BP 122/65   Pulse 92   Temp 98.1 F (36.7 C) (Oral)   Resp (!) 40   Ht '5\' 4"'$  (1.626 m)   Wt 68 kg   SpO2 95%   BMI 25.75 kg/m   General: 76 y.o. year-old female ill appearing, but in no acute distress.  Alert and oriented x3. HEENT: NCAT, EOMI Neck: Supple, trachea medial Cardiovascular: Regular rate and rhythm with no rubs or gallops.  No thyromegaly or JVD noted.  No lower extremity edema. 2/4 pulses in all 4 extremities. Respiratory: Bilateral  rales in lower lobes on auscultation.  Patient with increased work of breathing.   Abdomen: Soft, nontender nondistended with normal bowel sounds x4 quadrants. Muskuloskeletal: +3 edema bilaterally to the knees.  No cyanosis or clubbing noted bilaterally Neuro: CN II-XII intact, strength 5/5 x 4, sensation, reflexes intact Skin: No ulcerative lesions noted or rashes Psychiatry: Judgement and insight appear normal. Mood is appropriate for condition and setting          Labs on Admission:  Basic Metabolic Panel: Recent Labs  Lab 10/14/21 1845  NA 138  K 3.5  CL 106  CO2 25  GLUCOSE 107*  BUN 24*  CREATININE 1.03*  CALCIUM 8.2*   Liver Function Tests: Recent Labs  Lab 10/14/21 1845  AST 27  ALT 32  ALKPHOS 153*  BILITOT 0.6  PROT 6.3*  ALBUMIN 3.3*   No results for input(s): LIPASE, AMYLASE in the last 168 hours. No results for input(s): AMMONIA in the last 168 hours. CBC: Recent Labs  Lab 10/14/21 1845  WBC 10.1  NEUTROABS 8.8*  HGB 10.6*  HCT 31.7*  MCV 94.3  PLT 238   Cardiac Enzymes: No results for input(s): CKTOTAL, CKMB, CKMBINDEX, TROPONINI in the last 168 hours.  BNP (last 3 results) Recent Labs    09/27/21 1624  BNP  175.0*    ProBNP (last 3 results) No results for input(s): PROBNP in the last 8760 hours.  CBG: No results for input(s): GLUCAP in the last 168 hours.  Radiological Exams on Admission: DG Chest Left Decubitus  Result Date: 10/14/2021 CLINICAL DATA:  Shortness of breath and pleural effusion. EXAM: CHEST - LEFT DECUBITUS COMPARISON:  10/14/2021 at 4 p.m. FINDINGS: The left pleural effusion is primarily free-flowing, collecting dependently on the left lateral decubitus projection. The right pleural effusion configuration is likewise substantially changed, and likely primarily free-flowing. Atherosclerotic calcification of the aortic arch. Low lung volumes are present, causing crowding of the pulmonary vasculature. Cardiac borders moderately obscured. IMPRESSION: 1. Free-flowing left pleural effusion. Primarily free-flowing right pleural effusion. 2.  Aortic Atherosclerosis (ICD10-I70.0). 3. Low lung volumes. Electronically Signed   By: Van Clines M.D.   On: 10/14/2021 18:30   DG Chest Portable 1 View  Result Date: 10/14/2021 CLINICAL DATA:  Shortness of breath EXAM: PORTABLE CHEST 1 VIEW COMPARISON:  Chest x-ray dated Oct 01, 2021 FINDINGS: Visualized cardiac and mediastinal contours are unchanged. Mild airspace opacities of the lower left lung, likely atelectasis. No evidence of pneumothorax. Moderate right-greater-than-left pleural effusions, increased in size when compared with prior exam. IMPRESSION: Moderate right-greater-than-left pleural effusions, increased in size when compared with prior exam. Electronically Signed   By: Yetta Glassman M.D.   On: 10/14/2021 16:08    EKG: I independently viewed the EKG done and my findings are as followed: Normal sinus rhythm at a rate of 91 bpm with QTc 576 ms  Assessment/Plan Present on Admission:  Bilateral pleural effusion  Subdural hematoma (HCC)  Principal Problem:   Bilateral pleural effusion Active Problems:   Subdural hematoma  (HCC)   Acute respiratory failure with hypoxia (HCC)   Diastolic CHF (HCC)   Prolonged QT interval   Acquired hypothyroidism   Essential hypertension   Acute respiratory failure with hypoxia possibly secondary to bilateral pleural effusion Portable chest x-ray showed moderate right-greater-than-left pleural effusions, increased in size when compared with prior exam. Patient had right-sided thoracentesis with aspiration of 1.2 L of fluid during last admission.  Cytology only reflected reactive mesothelial cells. IV Lasix 20  mg x 1 will be given due to soft BP.  Continue IV Lasix 40 mg twice daily as permitted by BP IR will be consulted for a repeat thoracentesis  Prolonged QTc (576 ms) Avoid QT prolonging drugs Magnesium level will be checked Potassium level will be optimized  Diastolic CHF Echocardiogram done on 09/28/2021 showed LVEF of 72%, but G1 DD Considering that patient stopped taking Lasix until today since 5/11, it is unknown if patient has developed concomitant systolic CHF.  BNP will be checked, Limited echocardiogram will be checked in the morning Continue total input/output, daily weights and fluid restriction Continue Cardiac diet   Hypoalbuminemia possibly secondary to mild protein calorie malnutrition Albumin 3.3, protein supplement to be provided  History of small right-sided traumatic subdural hematoma Stable  Hypothyroidism Continue Synthroid  Essential hypertension BP meds will be held at this time due to soft BP   Echocardiogram (09/28/2021) Impression 1.Left ventricular ejection fraction by 3D volume is 72 %. The left ventricle has hyperdynamic function. The left ventricle has no regional wall motion abnormalities. Left ventricular diastolic parameters are consistent with Grade I diastolic dysfunction (impaired relaxation). 2. Right ventricular systolic function is normal. The right ventricular size is normal. A small pericardial effusion is present. The  pericardial effusion is anterior to the right ventricle. Large pleural effusion in the left lateral region. 3. The mitral valve is normal in structure. Trivial mitral valve regurgitation. No evidence of mitral stenosis. 4. The aortic valve is grossly normal. There is mild calcification of the aortic valve. Aortic valve regurgitation is trivial. No aortic stenosis is present. 5. The inferior vena cava is normal in size with greater than 50% respiratory variability, suggesting right atrial pressure of 3 mmHg.  DVT prophylaxis: SCDs  Code Status: Full code  Consults: IR  Family Communication: At bedside (all questions answered to satisfaction)  Severity of Illness: The appropriate patient status for this patient is INPATIENT. Inpatient status is judged to be reasonable and necessary in order to provide the required intensity of service to ensure the patient's safety. The patient's presenting symptoms, physical exam findings, and initial radiographic and laboratory data in the context of their chronic comorbidities is felt to place them at high risk for further clinical deterioration. Furthermore, it is not anticipated that the patient will be medically stable for discharge from the hospital within 2 midnights of admission.   * I certify that at the point of admission it is my clinical judgment that the patient will require inpatient hospital care spanning beyond 2 midnights from the point of admission due to high intensity of service, high risk for further deterioration and high frequency of surveillance required.*  Author: Bernadette Hoit, DO 10/14/2021 9:39 PM  For on call review [email or web address].

## 2021-10-14 NOTE — ED Provider Notes (Signed)
  Face-to-face evaluation   History: She complains of shortness of breath that started within 2 days of her hospital discharge after treatment with thoracentesis for effusion.  The effusion was transudative.  She denies fever, chills, productive cough or chest pain.  Physical exam: Alert elderly female.  No respiratory distress.  She is lucid.  MDM: Evaluation for  Chief Complaint  Patient presents with   Shortness of Breath     Recurrent pleural effusion, etiology not clear, rapid progression, following recent thoracentesis.  Doubt ACS, pneumonia or metabolic disorders at this time.  Medical screening examination/treatment/procedure(s) were conducted as a shared visit with non-physician practitioner(s) and myself.  I personally evaluated the patient during the encounter    Daleen Bo, MD 10/15/21 [phone]

## 2021-10-14 NOTE — ED Triage Notes (Signed)
Pt with continued SOB since discharge from here about a week or so ago. Pt states she has had to increased her home O2 to 4 L.

## 2021-10-14 NOTE — ED Provider Notes (Signed)
[facility] EMERGENCY DEPARTMENT Provider Note   CSN: [id number] Arrival date & time: 10/14/21  1520     History  Chief Complaint  Patient presents with   Shortness of Breath    [name] is a 76 y.o. female.   Shortness of Breath Associated symptoms: no abdominal pain, no chest pain, no cough, no fever, no headaches, no vomiting and no wheezing        [name] is a 76 y.o. 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  She was discharged on 10/02/2021 and presents to the Emergency Department today complaining of increasing shortness of breath and edema.  Patient's hospital course included treatment for community-acquired pneumonia, she was found to have subdural hematoma secondary to fall and pleural effusions with right greater than left.  She underwent right-sided thoracentesis on 09/30/2021 with removal of 1.2 L of pleural fluid.  Patient reports immediate improvement of her breathing.  She was treated with Rocephin and azithromycin and transitioned to oral antibiotics.  She notes increasing swelling and dyspnea starting about 4 days after her discharge home.  She was discharged home on 2 L of oxygen by nasal cannula.  She has gradually had to increase her oxygen, she is now on 4 L continuously.  She reports having increasing dyspnea on exertion.  She denies any chest pain, fever, chills, or cough.  She was seen by her PCP and prescribed furosemide.  She states there was some confusion with dosing and there was a delay in the PCPs office getting back with her.  She has not started the furosemide until today.  She was seen by home health nurse and advised to come to the hospital for further evaluation.     Home Medications Prior to Admission medications   Medication  Sig Start Date End Date Taking? Authorizing Provider  albuterol (VENTOLIN HFA) 108 (90 Base) MCG/ACT inhaler Inhale 2 puffs into the lungs every 6 (six) hours as needed for wheezing or shortness of breath. 10/02/21   Kathie Dike, MD  Ascorbic Acid (VITAMIN C) 1000 MG tablet Take 1,000 mg by mouth 2 (two) times a day.    [provider]  b complex vitamins tablet Take 2 tablets by mouth daily.    [provider]  diltiazem (CARDIZEM CD) 180 MG 24 hr capsule Take 180 mg by mouth daily.    [provider]  guaiFENesin (MUCINEX) 600 MG 12 hr tablet Take 1 tablet (600 mg total) by mouth 2 (two) times daily. 10/02/21   Kathie Dike, MD  hydrALAZINE (APRESOLINE) 50 MG tablet Take 1 tablet (50 mg total) by mouth 3 (three) times daily. 10/02/21   Kathie Dike, MD  isosorbide mononitrate (IMDUR) 30 MG 24 hr tablet Take 1 tablet (30 mg total) by mouth daily. 10/03/21   Kathie Dike, MD  levothyroxine (SYNTHROID) 75 MCG tablet Take 75 mcg by mouth daily before breakfast.    [provider]  Magnesium Malate 1250 (141.7 Mg) MG TABS Take 1,250 mg by mouth. Two tablets daily.    [provider]  nitrofurantoin (MACRODANTIN) 100 MG capsule Take 100 mg by mouth daily.    [provider]  Potassium 99 MG TABS Take 1 tablet by mouth 2 (two) times a day.    [provider]  pregabalin (LYRICA) 100 MG capsule TAKE  2 CAPSULES AT BEDTIME. MAY TAKE AN EXTRA ONE IF NEEDED. 08/13/21   Suzzanne Cloud, NP  Probiotic Product (ALIGN PO) Take 1 tablet by mouth daily.    [provider]  Rotigotine (NEUPRO) 1 MG/24HR PT24 Place 1 patch (1 mg total) onto the skin daily. 08/13/21   Suzzanne Cloud, NP  sodium chloride 1 g tablet Take 1 g by mouth 3 (three) times daily. 08/26/21   [provider]  UNABLE TO FIND Take 500 mg by mouth daily. Med Name: Beet Root    [provider]      Allergies    Iodinated contrast media, Atorvastatin  calcium, Doxycycline, Heparin, Lisinopril, Lovastatin, and Ropinirole hcl    Review of Systems   Review of Systems  Constitutional:  Negative for appetite change and fever.  Respiratory:  Positive for shortness of breath. Negative for cough and wheezing.   Cardiovascular:  Positive for leg swelling. Negative for chest pain.  Gastrointestinal:  Negative for abdominal pain, diarrhea, nausea and vomiting.  Genitourinary:  Negative for dysuria.  Skin:  Negative for color change and wound.  Neurological:  Negative for dizziness, weakness, numbness and headaches.   Physical Exam Updated Vital Signs BP (!) 160/81   Pulse 92   Temp 98 F (36.7 C) (Oral)   Resp (!) 25   Ht _0  (1.626 m)   Wt 68 kg   SpO2 94%   BMI 25.75 kg/m  Physical Exam Vitals and nursing note reviewed.  Constitutional:      Comments: Patient is uncomfortable appearing  HENT:     Mouth/Throat:     Mouth: Mucous membranes are moist.  Eyes:     Extraocular Movements: Extraocular movements intact.     Conjunctiva/sclera: Conjunctivae normal.     Pupils: Pupils are equal, round, and reactive to light.  Cardiovascular:     Rate and Rhythm: Normal rate and regular rhythm.     Pulses: Normal pulses.  Pulmonary:     Breath sounds: No wheezing.     Comments: Patient on 4 L oxygen by nasal cannula.  She has increased work of breathing during my exam.  Lungs are clear to auscultation bilaterally.  No wheezing or rales. Abdominal:     General: There is no distension.     Palpations: Abdomen is soft.     Tenderness: There is no abdominal tenderness.  Musculoskeletal:     Right lower leg: Edema present.     Left lower leg: Edema present.     Comments: Patient has pitting edema of the bilateral lower extremities to the level of the thigh.  No erythema or excessive warmth.  No calf tenderness on exam  Skin:    General: Skin is warm.     Capillary Refill: Capillary refill takes less than 2 seconds.  Neurological:      General: No focal deficit present.     Mental Status: She is alert.     Sensory: No sensory deficit.     Motor: No weakness.    ED Results / Procedures / Treatments   Labs (all labs ordered are listed, but only abnormal results are displayed) Labs Reviewed  CBC WITH DIFFERENTIAL/PLATELET - Abnormal; Notable for the following components:      Result Value   RBC 3.36 (*)    Hemoglobin 10.6 (*)    HCT 31.7 (*)    Neutro Abs 8.8 (*)    Lymphs Abs 0.6 (*)    All other  components within normal limits  COMPREHENSIVE METABOLIC PANEL - Abnormal; Notable for the following components:   Glucose, Bld 107 (*)    BUN 24 (*)    Creatinine, Ser 1.03 (*)    Calcium 8.2 (*)    Total Protein 6.3 (*)    Albumin 3.3 (*)    Alkaline Phosphatase 153 (*)    GFR, Estimated 57 (*)    All other components within normal limits  BRAIN NATRIURETIC PEPTIDE  COMPREHENSIVE METABOLIC PANEL  CBC  MAGNESIUM  PHOSPHORUS    EKG EKG Interpretation  Date/Time:  Tuesday Oct 14 2021 16:25:20 EDT Ventricular Rate:  91 PR Interval:  150 QRS Duration: 75 QT Interval:  470 QTC Calculation: 576 R Axis:   -20 Text Interpretation: Sinus rhythm Ventricular premature complex Borderline left axis deviation Borderline low voltage, extremity leads Prolonged QT interval Since last tracing QT has lengthened Otherwise no significant change Confirmed by Daleen Bo [phone]) on 10/14/2021 5:48:33 PM  Radiology DG Chest Left Decubitus  Result Date: 10/14/2021 CLINICAL DATA:  Shortness of breath and pleural effusion. EXAM: CHEST - LEFT DECUBITUS COMPARISON:  10/14/2021 at 4 p.m. FINDINGS: The left pleural effusion is primarily free-flowing, collecting dependently on the left lateral decubitus projection. The right pleural effusion configuration is likewise substantially changed, and likely primarily free-flowing. Atherosclerotic calcification of the aortic arch. Low lung volumes are present, causing crowding of the pulmonary  vasculature. Cardiac borders moderately obscured. IMPRESSION: 1. Free-flowing left pleural effusion. Primarily free-flowing right pleural effusion. 2.  Aortic Atherosclerosis (ICD10-I70.0). 3. Low lung volumes. Electronically Signed   By: Van Clines M.D.   On: 10/14/2021 18:30   DG Chest Portable 1 View  Result Date: 10/14/2021 CLINICAL DATA:  Shortness of breath EXAM: PORTABLE CHEST 1 VIEW COMPARISON:  Chest x-ray dated Oct 01, 2021 FINDINGS: Visualized cardiac and mediastinal contours are unchanged. Mild airspace opacities of the lower left lung, likely atelectasis. No evidence of pneumothorax. Moderate right-greater-than-left pleural effusions, increased in size when compared with prior exam. IMPRESSION: Moderate right-greater-than-left pleural effusions, increased in size when compared with prior exam. Electronically Signed   By: Yetta Glassman M.D.   On: 10/14/2021 16:08    Procedures Procedures    Medications Ordered in ED Medications - No data to display  ED Course/ Medical Decision Making/ A&P                           Medical Decision Making Patient here for evaluation of gradually worsening shortness of breath, orthopnea and dyspnea on exertion.  She was admitted into the hospital on 09/27/2021 after a syncopal episode that resulted in a subdural hematoma.  She was also noted to have pneumonia at and bilateral pleural effusions with right greater than left.  She was started on antibiotics and underwent thoracentesis with greater than 1 L of fluid drained.  Cultures and Gram stains were negative for growth.  Patient was transition to oral antibiotics and discharged home.  She was started on 2 L of oxygen by nasal cannula.  She did not require supplemental oxygen prior to her last hospital admission.  Since discharge home, she reports gradually increasing peripheral edema and shortness of breath.  She has increased her oxygen level at home to 4 L.  She still continues to have dyspnea  on exertion.  No fever or cough.  She denies any chest pain.  On my exam, patient has some labored breathing during history.  Appears slightly  anxious.  Lung sounds without wheezing or crackles.  He does have bilateral peripheral edema, pitting to the level of the knee.  Amount and/or Complexity of Data Reviewed External Data Reviewed: notes.    Details: Previous medical records reviewed by me including cytology, Gram stain and body fluid culture from previous thoracentesis. Labs: ordered.    Details: Labs this evening show no evidence of leukocytosis.  Hemoglobin of 10.6.  This is improved from baseline.  Chemistries show mildly elevated BUN and creatinine.  Alk phos of 153.  BNP 43 Radiology: ordered.    Details: Chest x-ray shows moderate right greater than left pleural effusions increased in size compared to prior exam.  Left decubitus shows free-flowing left pleural effusion primarily free-flowing right pleural effusion ECG/medicine tests: ordered.    Details: EKG shows sinus rhythm borderline a axis deviation and prolonged QT, lengthened since prior tracing.  reviewed by Dr. Eulis Foster Discussion of management or test interpretation with external provider(s): Patient here with hypoxia, and increasing bilateral pleural effusions.  No obvious infections.  She has history of CHF.  Echo was performed on recent hospitalization with a EF of 72%.  She will need hospitalization, likely repeat thoracentesis.  Discussed findings with [facility] hospitalist, Dr. Josephine Cables who is agreeable to admit.  Risk Decision regarding hospitalization.           Final Clinical Impression(s) / ED Diagnoses Final diagnoses:  None    Rx / DC Orders ED Discharge Orders     None         Kem Parkinson, PA-C 10/14/21 2351    Daleen Bo, MD 10/15/21 1635

## 2021-10-15 ENCOUNTER — Other Ambulatory Visit (HOSPITAL_COMMUNITY): Payer: Self-pay | Admitting: *Deleted

## 2021-10-15 ENCOUNTER — Inpatient Hospital Stay (HOSPITAL_COMMUNITY): Payer: Medicare PPO

## 2021-10-15 ENCOUNTER — Encounter (HOSPITAL_COMMUNITY): Payer: Self-pay | Admitting: Internal Medicine

## 2021-10-15 DIAGNOSIS — E7800 Pure hypercholesterolemia, unspecified: Secondary | ICD-10-CM | POA: Diagnosis not present

## 2021-10-15 DIAGNOSIS — I5033 Acute on chronic diastolic (congestive) heart failure: Secondary | ICD-10-CM | POA: Diagnosis not present

## 2021-10-15 DIAGNOSIS — J9601 Acute respiratory failure with hypoxia: Secondary | ICD-10-CM | POA: Diagnosis not present

## 2021-10-15 DIAGNOSIS — J189 Pneumonia, unspecified organism: Secondary | ICD-10-CM | POA: Diagnosis not present

## 2021-10-15 DIAGNOSIS — D6959 Other secondary thrombocytopenia: Secondary | ICD-10-CM | POA: Diagnosis not present

## 2021-10-15 DIAGNOSIS — J90 Pleural effusion, not elsewhere classified: Secondary | ICD-10-CM | POA: Diagnosis not present

## 2021-10-15 LAB — ECHOCARDIOGRAM LIMITED
Calc EF: 72.5 %
Height: 64 in
S' Lateral: 2 cm
Single Plane A2C EF: 67.8 %
Single Plane A4C EF: 74.9 %
Weight: 2437.41 oz

## 2021-10-15 LAB — COMPREHENSIVE METABOLIC PANEL
ALT: 27 U/L (ref 0–44)
AST: 21 U/L (ref 15–41)
Albumin: 2.9 g/dL — ABNORMAL LOW (ref 3.5–5.0)
Alkaline Phosphatase: 134 U/L — ABNORMAL HIGH (ref 38–126)
Anion gap: 6 (ref 5–15)
BUN: 21 mg/dL (ref 8–23)
CO2: 27 mmol/L (ref 22–32)
Calcium: 7.9 mg/dL — ABNORMAL LOW (ref 8.9–10.3)
Chloride: 108 mmol/L (ref 98–111)
Creatinine, Ser: 0.95 mg/dL (ref 0.44–1.00)
GFR, Estimated: >60 mL/min (ref >60)
Glucose, Bld: 94 mg/dL (ref 70–99)
Potassium: 3.8 mmol/L (ref 3.5–5.1)
Sodium: 141 mmol/L (ref 135–145)
Total Bilirubin: 0.5 mg/dL (ref 0.3–1.2)
Total Protein: 5.5 g/dL — ABNORMAL LOW (ref 6.5–8.1)

## 2021-10-15 LAB — BODY FLUID CELL COUNT WITH DIFFERENTIAL
Eos, Fluid: 0 %
Lymphs, Fluid: 61 %
Monocyte-Macrophage-Serous Fluid: 8 % — ABNORMAL LOW (ref 50–90)
Neutrophil Count, Fluid: 31 % — ABNORMAL HIGH (ref 0–25)
Total Nucleated Cell Count, Fluid: 1584 cu mm — ABNORMAL HIGH (ref 0–1000)

## 2021-10-15 LAB — CBC
HCT: 28.8 % — ABNORMAL LOW (ref 36.0–46.0)
Hemoglobin: 9.4 g/dL — ABNORMAL LOW (ref 12.0–15.0)
MCH: 30.8 pg (ref 26.0–34.0)
MCHC: 32.6 g/dL (ref 30.0–36.0)
MCV: 94.4 fL (ref 80.0–100.0)
Platelets: 216 10*3/uL (ref 150–400)
RBC: 3.05 MIL/uL — ABNORMAL LOW (ref 3.87–5.11)
RDW: 14.5 % (ref 11.5–15.5)
WBC: 7 10*3/uL (ref 4.0–10.5)
nRBC: 0 % (ref 0.0–0.2)

## 2021-10-15 LAB — GLUCOSE, PLEURAL OR PERITONEAL FLUID: Glucose, Fluid: 99 mg/dL

## 2021-10-15 LAB — GRAM STAIN

## 2021-10-15 LAB — PROTEIN, PLEURAL OR PERITONEAL FLUID: Total protein, fluid: 3.4 g/dL

## 2021-10-15 LAB — LACTATE DEHYDROGENASE, PLEURAL OR PERITONEAL FLUID: LD, Fluid: 113 U/L — ABNORMAL HIGH (ref 3–23)

## 2021-10-15 LAB — MAGNESIUM: Magnesium: 1.6 mg/dL — ABNORMAL LOW (ref 1.7–2.4)

## 2021-10-15 LAB — PHOSPHORUS: Phosphorus: 2.4 mg/dL — ABNORMAL LOW (ref 2.5–4.6)

## 2021-10-15 MED ORDER — ISOSORBIDE MONONITRATE ER 30 MG PO TB24
30.0000 mg | ORAL_TABLET | Freq: Every day | ORAL | Status: DC
  Administered 2021-10-15 – 2021-10-20 (×5): 30 mg via ORAL
  Filled 2021-10-15 (×5): qty 1

## 2021-10-15 MED ORDER — MAGNESIUM SULFATE 2 GM/50ML IV SOLN
2.0000 g | Freq: Once | INTRAVENOUS | Status: AC
  Administered 2021-10-15: 2 g via INTRAVENOUS
  Filled 2021-10-15: qty 50

## 2021-10-15 MED ORDER — ALBUMIN HUMAN 25 % IV SOLN
25.0000 g | Freq: Four times a day (QID) | INTRAVENOUS | Status: AC
  Administered 2021-10-15 – 2021-10-16 (×4): 25 g via INTRAVENOUS
  Filled 2021-10-15 (×4): qty 100

## 2021-10-15 MED ORDER — POTASSIUM CHLORIDE CRYS ER 20 MEQ PO TBCR
20.0000 meq | EXTENDED_RELEASE_TABLET | Freq: Every day | ORAL | Status: DC
  Administered 2021-10-16: 20 meq via ORAL
  Filled 2021-10-15: qty 1

## 2021-10-15 MED ORDER — DILTIAZEM HCL ER COATED BEADS 180 MG PO CP24
180.0000 mg | ORAL_CAPSULE | Freq: Every day | ORAL | Status: DC
  Administered 2021-10-15 – 2021-10-20 (×6): 180 mg via ORAL
  Filled 2021-10-15 (×6): qty 1

## 2021-10-15 MED ORDER — SACCHAROMYCES BOULARDII 250 MG PO CAPS
250.0000 mg | ORAL_CAPSULE | Freq: Every day | ORAL | Status: DC
Start: 2021-10-15 — End: 2021-10-20
  Administered 2021-10-15 – 2021-10-20 (×5): 250 mg via ORAL
  Filled 2021-10-15 (×5): qty 1

## 2021-10-15 MED ORDER — NITROFURANTOIN MACROCRYSTAL 100 MG PO CAPS
100.0000 mg | ORAL_CAPSULE | Freq: Every day | ORAL | Status: DC
  Filled 2021-10-15 (×4): qty 1

## 2021-10-15 MED ORDER — ALBUTEROL SULFATE (2.5 MG/3ML) 0.083% IN NEBU: 3.0000 mL | INHALATION_SOLUTION | Freq: Four times a day (QID) | RESPIRATORY_TRACT | Status: DC | PRN

## 2021-10-15 MED ORDER — LORAZEPAM 0.5 MG PO TABS
0.5000 mg | ORAL_TABLET | Freq: Once | ORAL | Status: AC
  Administered 2021-10-15: 0.5 mg via ORAL
  Filled 2021-10-15: qty 1

## 2021-10-15 MED ORDER — PREGABALIN 75 MG PO CAPS
200.0000 mg | ORAL_CAPSULE | Freq: Every day | ORAL | Status: DC
  Administered 2021-10-15 – 2021-10-19 (×5): 200 mg via ORAL
  Filled 2021-10-15 (×5): qty 1

## 2021-10-15 MED ORDER — ROTIGOTINE 1 MG/24HR TD PT24
1.0000 mg | MEDICATED_PATCH | Freq: Every day | TRANSDERMAL | Status: DC
  Administered 2021-10-15: 1 mg via TRANSDERMAL
  Filled 2021-10-15: qty 1

## 2021-10-15 MED ORDER — GUAIFENESIN ER 600 MG PO TB12
600.0000 mg | ORAL_TABLET | Freq: Two times a day (BID) | ORAL | Status: DC
Start: 2021-10-15 — End: 2021-10-20
  Administered 2021-10-15 – 2021-10-20 (×10): 600 mg via ORAL
  Filled 2021-10-15 (×10): qty 1

## 2021-10-15 MED ORDER — NITROFURANTOIN MONOHYD MACRO 100 MG PO CAPS
100.0000 mg | ORAL_CAPSULE | Freq: Every day | ORAL | Status: DC
  Administered 2021-10-15 – 2021-10-18 (×4): 100 mg via ORAL
  Filled 2021-10-15 (×5): qty 1

## 2021-10-15 MED ORDER — LIDOCAINE HCL (PF) 2 % IJ SOLN
INTRAMUSCULAR | Status: AC
  Administered 2021-10-15: 10 mL via INTRADERMAL
  Filled 2021-10-15: qty 10

## 2021-10-15 NOTE — Hospital Course (Signed)
[name] is a 76 y.o. 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  Patient was admitted from 5/6 through 5/11 due to community acquired pneumonia with acute hypoxic respiratory failure and pleural effusion which required right-sided thoracentesis on 09/30/2021 with removal of 1.2 L of pleural fluid noted to be transudative in nature (considered to be parapneumonic in the setting of underlying infection), during hospitalization she was treated with IV Rocephin/azithromycin mucolytic's and bronchodilators and this was transitioned to oral antibiotics prior to discharge.  She also had syncope on that admission with echo revealing G1 DD. Patient now complains of worsening shortness of breath and increased work of breathing, she was discharged with supplemental oxygen at 2 LPM via [place], but patient has gradually increased this to 4 LPM.  She also complaining of increased leg swelling.  She had a follow-up with her PCP who prescribed furosemide, but there was some confusion regarding the dosing, so there was delay in patient starting the medication which was just started today.  She was seen by home health normal who asked her to go to the ED for further evaluation and management.   ED Course:  She was found tachypneic, BP 153/89, O2 sat was 94-96% on 4 L supplemental oxygen .Work-up in the ED showed normocytic anemia, BMP was normal except for BUN of 24, creatinine was 1.03 (this ranged within 1.5-1.7 in the last 2 weeks). Chest x-ray (left decubitus) showed free-flowing left pleural effusion. Primarily free-flowing right pleural effusion. Portable chest x-ray showed moderate right-greater-than-left  pleural effusions, increased in size when compared with prior exam.   Hospitalist was asked to admit patient for further evaluation and management.

## 2021-10-15 NOTE — Progress Notes (Signed)
  Transition of Care [facility]) Screening Note   Patient Details  Name: [name] Date of Birth: [birth year]   Transition of [facility]) CM/SW Contact:    Ihor Gully, LCSW Phone Number: 10/15/2021, 1:17 PM    Transition of Care Department [facility]) has reviewed patient and no TOC needs have been identified at this time. We will continue to monitor patient advancement through interdisciplinary progression rounds. If new patient transition needs arise, please place a TOC consult.

## 2021-10-15 NOTE — Progress Notes (Signed)
PT tolerated right sided thoracentesis procedure well today and 1.4 liters of clear yellow fluid removed with labs collected and sent for processing. PT taken via stretcher for post chest xray at this time with no acute distress noted. Pt verbalized understanding of post procedure instructions and report given to inpatient nurse assigned at this time.

## 2021-10-15 NOTE — Progress Notes (Signed)
PROGRESS NOTE    Patient: [name]                            PCP: Glenda Chroman, MD                    DOB: 02/23/1946            DOA: 10/14/2021 OXB:[id number]             DOS: 10/15/2021, 11:09 AM   LOS: 1 day   Date of Service: The patient was seen and examined on 10/15/2021  Subjective:   The patient was seen and examined this morning. Remains respiratory distress, 4 L of oxygen, complaining shortness of breath,  Otherwise no issues overnight .  Brief Narrative:   [name] is a 76 y.o. 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  Patient was admitted from 5/6 through 5/11 due to community acquired pneumonia with acute hypoxic respiratory failure and pleural effusion which required right-sided thoracentesis on 09/30/2021 with removal of 1.2 L of pleural fluid noted to be transudative in nature (considered to be parapneumonic in the setting of underlying infection), during hospitalization she was treated with IV Rocephin/azithromycin mucolytic's and bronchodilators and this was transitioned to oral antibiotics prior to discharge.  She also had syncope on that admission with echo revealing G1 DD. Patient now complains of worsening shortness of breath and increased work of breathing, she was discharged with supplemental oxygen at 2 LPM via [place], but patient has gradually increased this to 4 LPM.  She also complaining of increased leg swelling.  She had a follow-up with her PCP who prescribed furosemide, but there was some confusion regarding the dosing, so there was delay in patient starting the medication which was just started today.  She was seen by home health normal who asked  her to go to the ED for further evaluation and management.   ED Course:  She was found tachypneic, BP 153/89, O2 sat was 94-96% on 4 L supplemental oxygen .Work-up in the ED showed normocytic anemia, BMP was normal except for BUN of 24, creatinine was 1.03 (this ranged within 1.5-1.7 in the last 2 weeks). Chest x-ray (left decubitus) showed free-flowing left pleural effusion. Primarily free-flowing right pleural effusion. Portable chest x-ray showed moderate right-greater-than-left pleural effusions, increased in size when compared with prior exam.   Hospitalist was asked to admit patient for further evaluation and management.    Assessment & Plan:   Principal Problem:   Bilateral pleural effusion Active Problems:   Subdural hematoma (HCC)   Acute respiratory failure with hypoxia (HCC)   Diastolic CHF (HCC)   Prolonged QT interval   Acquired hypothyroidism   Essential hypertension     Assessment and Plan:  Acute respiratory failure with hypoxia possibly secondary to bilateral pleural effusion Portable chest x-ray showed moderate right-greater-than-left pleural effusions, increased in size when compared with prior exam. Patient had right-sided thoracentesis with aspiration of 1.2 L of fluid during last admission.  Cytology only reflected reactive mesothelial cells.  IV Lasix 20 mg x 1 will be given due to soft BP.  Continue IV Lasix 40 mg twice daily as permitted by BP IR  will be consulted for a repeat thoracentesis -Successful US guided right thoracentesis. Yielded 1.4 L of clear yellow fluid.    Prolonged QTc (576 ms) Avoid QT prolonging drugs Magnesium 1.6 -repleting with 2 g of IV magnesium Potassium level will be optimized   Acute on chronic diastolic CHF grade 1-preserved ejection fraction Echocardiogram done on 09/28/2021 showed LVEF of 72%, but G1 DD -Considering that patient stopped taking Lasix until today 5/11, it is unknown if patient has developed concomitant  systolic CHF.   BNP: Normal at 43.0 Limited echocardiogram >>> Continue total input/output, daily weights and fluid restriction Continue Cardiac diet   Hypoalbuminemia  -possibly secondary to mild protein calorie malnutrition Albumin 3.3,>>>2.9  protein supplement to be provided -Repleted with 25% of IV albumin x4 doses every 6 hours   History of small right-sided traumatic subdural hematoma Stable   Hypothyroidism Continue Synthroid   Essential hypertension BP meds will be held at this time due to soft BP     Echocardiogram (09/28/2021) Impression 1.Left ventricular EJF 72 %. The left ventricle has hyperdynamic function. The left ventricle has no regional wall motion abnormalities. Left ventricular diastolic parameters are consistent with Grade I diastolic dysfunction (impaired relaxation). 2. Right ventricular systolic function is normal. The right ventricular size is normal. A small pericardial effusion is present. The pericardial effusion is anterior to the right ventricle. Large pleural effusion in the left lateral region. 3. The mitral valve is normal in structure. Trivial mitral valve regurgitation. No evidence of mitral stenosis. 4. The aortic valve is grossly normal. There is mild calcification of the aortic valve. Aortic valve regurgitation is trivial. No aortic stenosis is present. 5. The inferior vena cava is normal in size with greater than 50% respiratory variability, suggesting right atrial pressure of 3 mmHg.    Consults: IR   Procedure: Thoracentesis 10/15/2021 Successful US guided right thoracentesis. Yielded 1.4 L of clear yellow fluid.  --------------------------------------------------------------------------------------------------------------- Nutritional status:  The patient's BMI is: Body mass index is 26.15 kg/m. I agree with the assessment and plan as outlined Nutrition Status:        -----------------------------------------------------------------------------------------------------------  DVT prophylaxis:  SCDs Start: 10/14/21 2149   Code Status:   Code Status: Full Code  Family Communication: No family member present at bedside- attempt will be made to update daily The above findings and plan of care has been discussed with patient in detail,  they expressed understanding and agreement of above. -Advance care planning has been discussed.   Admission status:   Status is: Inpatient Remains inpatient appropriate because: Supplemental oxygen follow-up with hypoxia, shortness of breath requiring thoracentesis for bilateral pleural effusion  Disposition: From home Anticipating discharging home in next 24-48 hours with home health     Procedures:   No admission procedures for hospital encounter.   Antimicrobials:  Anti-infectives (From admission, onward)    Start     Dose/Rate Route Frequency Ordered Stop   10/15/21 1000  nitrofurantoin (MACRODANTIN) capsule 100 mg        100 mg Oral Daily 10/15/21 0716          Medication:   diltiazem  180 mg Oral Daily   feeding supplement  237 mL Oral BID BM   furosemide  40 mg Intravenous Q12H   guaiFENesin  600 mg Oral BID   isosorbide mononitrate  30 mg Oral Daily   levothyroxine  75 mcg Oral QAC breakfast   nitrofurantoin  100 mg Oral Daily   [START ON 10/16/2021] potassium chloride  20 mEq Oral Daily   pregabalin  200 mg Oral QHS   Rotigotine  1 mg Transdermal Daily   saccharomyces boulardii  250 mg Oral Daily    albuterol   Objective:   Vitals:   10/15/21 0953 10/15/21 1032 10/15/21 1040 10/15/21 1050  BP: (!) 156/72 (!) 167/80 (!) 150/91 132/70  Pulse:  96 90 96  Resp:  (!) 22 (!) 22 (!) 22  Temp:      TempSrc:      SpO2:  90% 90% 91%  Weight:      Height:        Intake/Output Summary (Last 24 hours) at 10/15/2021 1109 Last data filed at 10/15/2021 0900 Gross per 24 hour  Intake 480 ml   Output 600 ml  Net -120 ml   Filed Weights   10/14/21 1552 10/15/21 0600  Weight: 68 kg 69.1 kg     Examination:   Physical Exam  Constitution:  Alert, cooperative, no distress,  Appears calm and comfortable  Psychiatric:   Normal and stable mood and affect, cognition intact,   HEENT:        Normocephalic, PERRL, otherwise with in Normal limits  Chest:         Chest symmetric Cardio vascular:  S1/S2, RRR, No murmure, No Rubs or Gallops  pulmonary: Clear to auscultation bilaterally, respirations unlabored, negative wheezes / crackles Abdomen: Soft, non-tender, non-distended, bowel sounds,no masses, no organomegaly Muscular skeletal: Limited exam - in bed, able to move all 4 extremities,   Left BKA Neuro: CNII-XII intact. , normal motor and sensation, reflexes intact  Extremities: No pitting edema lower extremities, +2 pulses  Skin: Dry, warm to touch, negative for any Rashes, No open wounds Wounds: per nursing documentation   ------------------------------------------------------------------------------------------------------------------------------------------    LABs:     Latest Ref Rng & Units 10/15/2021    4:52 AM 10/14/2021    6:45 PM 10/01/2021    4:55 AM  CBC  WBC 4.0 - 10.5 K/uL 7.0   10.1   9.8    Hemoglobin 12.0 - 15.0 g/dL 9.4   10.6   9.8    Hematocrit 36.0 - 46.0 % 28.8   31.7   29.6    Platelets 150 - 400 K/uL 216   238   269        Latest Ref Rng & Units 10/15/2021    4:52 AM 10/14/2021    6:45 PM 10/02/2021    4:31 AM  CMP  Glucose 70 - 99 mg/dL 94   107   99    BUN 8 - 23 mg/dL '21   24   27    '$ Creatinine 0.44 - 1.00 mg/dL 0.95   1.03   1.45    Sodium 135 - 145 mmol/L 141   138   140    Potassium 3.5 - 5.1 mmol/L 3.8   3.5   3.3    Chloride 98 - 111 mmol/L 108   106   114    CO2 22 - 32 mmol/L '27   25   21    '$ Calcium 8.9 - 10.3 mg/dL 7.9   8.2   8.9    Total Protein 6.5 - 8.1 g/dL 5.5   6.3     Total Bilirubin 0.3 - 1.2 mg/dL 0.5   0.6      Alkaline Phos 38 - 126 U/L 134   153     AST 15 - 41 U/L 21   27  ALT 0 - 44 U/L 27   32          Micro Results No results found for this or any previous visit (from the past 240 hour(s)).  Radiology Reports DG Chest Left Decubitus  Result Date: 10/14/2021 CLINICAL DATA:  Shortness of breath and pleural effusion. EXAM: CHEST - LEFT DECUBITUS COMPARISON:  10/14/2021 at 4 p.m. FINDINGS: The left pleural effusion is primarily free-flowing, collecting dependently on the left lateral decubitus projection. The right pleural effusion configuration is likewise substantially changed, and likely primarily free-flowing. Atherosclerotic calcification of the aortic arch. Low lung volumes are present, causing crowding of the pulmonary vasculature. Cardiac borders moderately obscured. IMPRESSION: 1. Free-flowing left pleural effusion. Primarily free-flowing right pleural effusion. 2.  Aortic Atherosclerosis (ICD10-I70.0). 3. Low lung volumes. Electronically Signed   By: Van Clines M.D.   On: 10/14/2021 18:30   DG Chest Portable 1 View  Result Date: 10/14/2021 CLINICAL DATA:  Shortness of breath EXAM: PORTABLE CHEST 1 VIEW COMPARISON:  Chest x-ray dated Oct 01, 2021 FINDINGS: Visualized cardiac and mediastinal contours are unchanged. Mild airspace opacities of the lower left lung, likely atelectasis. No evidence of pneumothorax. Moderate right-greater-than-left pleural effusions, increased in size when compared with prior exam. IMPRESSION: Moderate right-greater-than-left pleural effusions, increased in size when compared with prior exam. Electronically Signed   By: Yetta Glassman M.D.   On: 10/14/2021 16:08   ECHOCARDIOGRAM LIMITED  Result Date: 10/15/2021    ECHOCARDIOGRAM LIMITED REPORT   Patient Name:   [name] Date of Exam: 10/15/2021 Medical Rec #:  [id number]     Height:       64.0 in Accession #:    [id number]    Weight:       152.3 lb Date of Birth:  17-Feb-1946     BSA:           1.743 m Patient Age:    33 years      BP:           149/73 mmHg Patient Gender: F             HR:           88 bpm. Exam Location:  [facility] Procedure: Limited Echo Indications:    CHF  History:        Patient has prior history of Echocardiogram examinations, most                 recent 09/28/2021. CHF, Abnormal ECG, Signs/Symptoms:Syncope; Risk                 Factors:Hypertension. Bilateral pleural effusions.  Sonographer:    Wenda Low Referring Phys: [id number] OLADAPO ADEFESO IMPRESSIONS  1. Limited study.  2. Left ventricular ejection fraction, by estimation, is 70 to 75%. The left ventricle has hyperdynamic function. The left ventricle has no regional wall motion abnormalities.  3. Right ventricular systolic function is normal. The right ventricular size is normal.  4. Bilateral pleural effusions noted, appears large on the left. A small pericardial effusion is present. The pericardial effusion is anterior to the right ventricle. There is no evidence of cardiac tamponade.  5. The aortic valve is tricuspid.  6. The inferior vena cava is normal in size with greater than 50% respiratory variability, suggesting right atrial pressure of 3 mmHg. Comparison(s): No significant change from prior study. Prior images reviewed side by side. FINDINGS  Left Ventricle: Left ventricular ejection fraction, by estimation, is 70 to 75%. The  left ventricle has hyperdynamic function. The left ventricle has no regional wall motion abnormalities. The left ventricular internal cavity size was normal in size. There is borderline left ventricular hypertrophy. Right Ventricle: The right ventricular size is normal. No increase in right ventricular wall thickness. Right ventricular systolic function is normal. Left Atrium: Left atrial size was normal in size. Right Atrium: Right atrial size was normal in size. Pericardium: Bilateral pleural effusions noted, appears large on the left. A small pericardial effusion is present. The  pericardial effusion is anterior to the right ventricle. There is no evidence of cardiac tamponade. Aortic Valve: The aortic valve is tricuspid. Pulmonic Valve: The pulmonic valve was grossly normal. Aorta: The aortic root is normal in size and structure. Venous: The inferior vena cava is normal in size with greater than 50% respiratory variability, suggesting right atrial pressure of 3 mmHg. IAS/Shunts: The interatrial septum was not assessed. LEFT VENTRICLE PLAX 2D LVIDd:         3.50 cm LVIDs:         2.00 cm LV PW:         1.10 cm LV IVS:        0.90 cm LVOT diam:     2.00 cm LVOT Area:     3.14 cm  LV Volumes (MOD) LV vol d, MOD A2C: 29.7 ml LV vol d, MOD A4C: 50.5 ml LV vol s, MOD A2C: 9.6 ml LV vol s, MOD A4C: 12.7 ml LV SV MOD A2C:     20.1 ml LV SV MOD A4C:     50.5 ml LV SV MOD BP:      29.3 ml LEFT ATRIUM         Index LA diam:    3.10 cm 1.78 cm/m   AORTA Ao Root diam: 3.10 cm  SHUNTS Systemic Diam: 2.00 cm Rozann Lesches MD Electronically signed by Rozann Lesches MD Signature Date/Time: 10/15/2021/10:49:40 AM    Final     SIGNED: Deatra James, MD, FHM. [facility],  Pager (please use [email or web address] to page/text) Please use Epic Secure Chat for non-urgent communication (7AM-7PM)  If 7PM-7AM, please contact night-coverage [email or web address], 10/15/2021, 11:09 AM

## 2021-10-15 NOTE — Procedures (Signed)
PROCEDURE SUMMARY:  Successful US guided right thoracentesis. Yielded 1.4 L of clear yellow fluid. Pt tolerated procedure well. No immediate complications.  Specimen sent for labs. CXR ordered; pending  EBL < 2 mL  Theresa Duty, NP 10/15/2021 11:05 AM

## 2021-10-16 ENCOUNTER — Inpatient Hospital Stay (HOSPITAL_COMMUNITY): Payer: Medicare PPO

## 2021-10-16 DIAGNOSIS — J90 Pleural effusion, not elsewhere classified: Secondary | ICD-10-CM | POA: Diagnosis not present

## 2021-10-16 DIAGNOSIS — J9601 Acute respiratory failure with hypoxia: Secondary | ICD-10-CM | POA: Diagnosis not present

## 2021-10-16 LAB — BASIC METABOLIC PANEL
Anion gap: 8 (ref 5–15)
BUN: 18 mg/dL (ref 8–23)
CO2: 30 mmol/L (ref 22–32)
Calcium: 7.4 mg/dL — ABNORMAL LOW (ref 8.9–10.3)
Chloride: 101 mmol/L (ref 98–111)
Creatinine, Ser: 1.04 mg/dL — ABNORMAL HIGH (ref 0.44–1.00)
GFR, Estimated: 56 mL/min — ABNORMAL LOW (ref >60)
Glucose, Bld: 98 mg/dL (ref 70–99)
Potassium: 2.8 mmol/L — ABNORMAL LOW (ref 3.5–5.1)
Sodium: 139 mmol/L (ref 135–145)

## 2021-10-16 LAB — CBC
HCT: 24.1 % — ABNORMAL LOW (ref 36.0–46.0)
Hemoglobin: 8 g/dL — ABNORMAL LOW (ref 12.0–15.0)
MCH: 31.6 pg (ref 26.0–34.0)
MCHC: 33.2 g/dL (ref 30.0–36.0)
MCV: 95.3 fL (ref 80.0–100.0)
Platelets: 163 10*3/uL (ref 150–400)
RBC: 2.53 MIL/uL — ABNORMAL LOW (ref 3.87–5.11)
RDW: 14.2 % (ref 11.5–15.5)
WBC: 10.5 10*3/uL (ref 4.0–10.5)
nRBC: 0 % (ref 0.0–0.2)

## 2021-10-16 MED ORDER — MAGNESIUM SULFATE 2 GM/50ML IV SOLN
2.0000 g | Freq: Once | INTRAVENOUS | Status: AC
  Administered 2021-10-16: 2 g via INTRAVENOUS
  Filled 2021-10-16: qty 50

## 2021-10-16 MED ORDER — ROTIGOTINE 1 MG/24HR TD PT24
1.0000 mg | MEDICATED_PATCH | Freq: Every day | TRANSDERMAL | Status: DC
  Administered 2021-10-16 – 2021-10-20 (×4): 1 mg via TRANSDERMAL
  Filled 2021-10-16 (×5): qty 1

## 2021-10-16 MED ORDER — LIDOCAINE HCL (PF) 2 % IJ SOLN
INTRAMUSCULAR | Status: AC
  Administered 2021-10-16: 10 mL
  Filled 2021-10-16: qty 10

## 2021-10-16 MED ORDER — DIPHENHYDRAMINE HCL 50 MG/ML IJ SOLN: 50.0000 mg | Freq: Once | INTRAMUSCULAR | Status: AC

## 2021-10-16 MED ORDER — POTASSIUM CHLORIDE CRYS ER 20 MEQ PO TBCR
30.0000 meq | EXTENDED_RELEASE_TABLET | Freq: Every day | ORAL | Status: DC
  Administered 2021-10-17 – 2021-10-18 (×2): 30 meq via ORAL
  Filled 2021-10-16 (×2): qty 1

## 2021-10-16 MED ORDER — PREDNISONE 20 MG PO TABS
50.0000 mg | ORAL_TABLET | Freq: Four times a day (QID) | ORAL | Status: AC
  Administered 2021-10-16 – 2021-10-17 (×2): 50 mg via ORAL
  Filled 2021-10-16 (×2): qty 1

## 2021-10-16 MED ORDER — PREDNISONE 50 MG PO TABS
50.0000 mg | ORAL_TABLET | Freq: Every day | ORAL | Status: AC
  Administered 2021-10-16 – 2021-10-20 (×5): 50 mg via ORAL
  Filled 2021-10-16 (×5): qty 1

## 2021-10-16 MED ORDER — DIPHENHYDRAMINE HCL 25 MG PO CAPS
50.0000 mg | ORAL_CAPSULE | Freq: Once | ORAL | Status: AC
  Administered 2021-10-17: 50 mg via ORAL
  Filled 2021-10-16: qty 2

## 2021-10-16 MED ORDER — PREDNISONE 20 MG PO TABS: 50.0000 mg | ORAL_TABLET | Freq: Four times a day (QID) | ORAL | Status: DC

## 2021-10-16 NOTE — Progress Notes (Signed)
Thoracentesis sites to bilateral back WDL and free of complications. Band aids intact.

## 2021-10-16 NOTE — Procedures (Signed)
PreOperative Dx: LEFT pleural effusion Postoperative Dx: LEFT pleural effusion Procedure:   US guided LEFT thoracentesis Radiologist:  Thornton Papas Anesthesia:  10 ml of 1% lidocaine Specimen:  800 mL of yellow colored fluid EBL:   < 1 ml Complications: None

## 2021-10-16 NOTE — Progress Notes (Signed)
Patient brought to [place] Rm per stretcher in no acute distress. Prepped and draped in sterile manner, positioned upright leaning over cart. Lidocaine admin with no adverse reaction. Access obtained through L thorax with catheter with no complication. Tolerated well. Clear serous discharge returned in vacuum bottle. 800cc total removed. Catheter withdrawn, bandage placed at site. No obvious bleeding or hematoma. Gauze with tape placed.

## 2021-10-16 NOTE — Consult Note (Signed)
NAME:  [name], MRN:  [id number], DOB:  02/02/1946, LOS: 2 ADMISSION DATE:  10/14/2021, CONSULTATION DATE:  10/16/2021  REFERRING MD:  shahmehdi, CHIEF COMPLAINT: Respiratory distress, bilateral pleural effusions  History of Present Illness:  76 year old never smoker admitted with bilateral pleural effusions.  She was admitted 5/6-5/11 with syncope and fall and found to have a small subdural hematoma along the falx.  CT chest showed moderate right and small left pleural effusion with airspace consolidation and air bronchograms in the right middle lobe suspicious for pneumonia.  She underwent right thoracentesis with removal of 1.2 L of fluid, noted to be transudative.  She was treated with Rocephin/azithromycin and discharged on oral antibiotics and 2 L of oxygen.  Over the past few weeks she developed increased leg swelling, PCP prescribed furosemide, she had increased her oxygen to 4 L and was admitted 5/23.  Chest x-ray showed recurrence of bilateral effusions. 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 rate 20, height '5\' 4"'$  (1.626 m),  weight 60.6 kg, SpO2 94 %.        Intake/Output Summary (Last 24 hours) at 10/16/2021 1649 Last data filed at 10/16/2021 1300 Gross per 24 hour  Intake 720 ml  Output 1050 ml  Net -330 ml   Filed Weights   10/14/21 1552 10/15/21 0600 10/16/21 0610  Weight: 68 kg 69.1 kg 60.6 kg    Examination: General: Elderly woman sitting up in bed, on 4 L nasal cannula HENT: Mild pallor, no icterus, no JVD Lungs: Decreased breath sounds bilaterally, no accessory muscle use Cardiovascular: S1-S2 regular, no murmur , dilated veins over the upper chest Abdomen: Soft, nontender, no hepatosplenomegaly Extremities: No deformity, 1+ edema Neuro: Alert, interactive, nonfocal, no asterixis   Resolved Hospital Problem list     Assessment & Plan:  Bilateral pleural effusions, unclear etiology -Appears transudative on 5/9 but exudative on 5/24 -Left-sided pleural fluid results pending -Cytology from 5/9 was negative for malignancy and showed reactive mesothelial cells -Echo shows normal LV function and BNP is low, she has bipedal edema and albumin is 2.9-3.3 range -She has a history of SVC syndrome and this is the biggest concern  Recommend -Proceed with CT angiogram chest this will allow [place] to evaluate for pulmonary emboli as well as evaluate SVC patency.  She has mild allergy to contrast, she does not remember details, is able to eat seafood, we will premedicate her with prednisone and Benadryl per protocol prior to administering contrast. -We will await pleural fluid results on left and cytology -If above nondiagnostic then she may need pleural biopsy  Best Practice (right click and "Reselect all SmartList Selections" daily)    Code Status:  full code Last date of multidisciplinary goals of care discussion [NA]  Labs  CBC: Recent Labs  Lab 10/14/21 1845 10/15/21 0452 10/16/21 0458  WBC 10.1 7.0 10.5  NEUTROABS 8.8*  --   --   HGB 10.6* 9.4* 8.0*  HCT 31.7* 28.8* 24.1*  MCV 94.3  94.4 95.3  PLT 238 216 063    Basic Metabolic Panel: Recent Labs  Lab 10/14/21 1845 10/15/21 0452 10/16/21 0458  NA 138 141 139  K 3.5 3.8 2.8*  CL 106 108 101  CO2 '25 27 30  '$ GLUCOSE 107* 94 98  BUN 24* 21 18  CREATININE 1.03* 0.95 1.04*  CALCIUM 8.2* 7.9* 7.4*  MG  --  1.6*  --   PHOS  --  2.4*  --    GFR: Estimated Creatinine Clearance: 40.4 mL/min (A) (by C-G formula based on SCr of 1.04 mg/dL (H)). Recent Labs  Lab 10/14/21 1845 10/15/21 0452 10/16/21 0458  WBC 10.1 7.0 10.5    Liver Function Tests: Recent Labs  Lab 10/14/21 1845 10/15/21 0452  AST 27 21  ALT 32 27  ALKPHOS 153* 134*  BILITOT 0.6 0.5  PROT 6.3* 5.5*  ALBUMIN 3.3* 2.9*   No results for input(s): LIPASE, AMYLASE in the last 168 hours. No results for input(s): AMMONIA in the last 168 hours.  ABG    Component Value Date/Time   PHART 7.534 (H) 10/15/2008 0506   PCO2ART 29.9 (L) 10/15/2008 0506   PO2ART 96.0 10/15/2008 0506   HCO3 25.2 (H) 10/15/2008 0506   TCO2 26 10/15/2008 0506   ACIDBASEDEF 1.0 09/29/2008 0336   O2SAT 98.0 10/15/2008 0506     Coagulation Profile: No results for input(s): INR, PROTIME in the last 168 hours.  Cardiac Enzymes: No results for input(s): CKTOTAL, CKMB, CKMBINDEX, TROPONINI in the last 168 hours.  HbA1C: Hgb A1c MFr Bld  Date/Time Value Ref Range Status  12/15/2018 03:20 PM 5.5 4.8 - 5.6 % Final    Comment:             Prediabetes: 5.7 - 6.4          Diabetes: >6.4          Glycemic control for adults with diabetes: <7.0     CBG: No results for input(s): GLUCAP in the last 168 hours.  Review of Systems:   Shortness of breath  Constitutional: negative for anorexia, fevers and sweats  Eyes: negative for irritation, redness and visual disturbance  Ears, nose, mouth, throat, and face: negative for earaches, epistaxis, nasal congestion and sore throat  Respiratory: negative for cough, sputum and wheezing  Cardiovascular: negative for chest  pain, orthopnea, palpitations and syncope  Gastrointestinal: negative for abdominal pain, constipation, diarrhea, melena, nausea and vomiting  Genitourinary:negative for dysuria, frequency and hematuria  Hematologic/lymphatic: negative for bleeding, easy bruising and lymphadenopathy  Musculoskeletal:negative for arthralgias, muscle weakness and stiff joints  Neurological: negative for coordination problems, gait problems, headaches and weakness  Endocrine: negative for diabetic symptoms including polydipsia, polyuria and weight loss   Past Medical History:  She,  has a past medical history of Dysphasia, Endometrial cancer ([place]), Heel spur, Heparin induced thrombocytopenia ([place]), Hypercholesteremia, Hypertension, Hypothyroidism, Migraine headache, Osteopenia, RLS (restless legs syndrome), SVC syndrome, and Thyroid disease.   Surgical History:   Past Surgical History:  Procedure Laterality Date   ABDOMINAL HYSTERECTOMY     ANKLE SURGERY Right    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     FINGER AMPUTATION     right index finger at knuckle   PORT-A-CATH REMOVAL     TONSILLECTOMY  Social History:   reports that she has never smoked. She has never used smokeless tobacco. She reports that she does not drink alcohol and does not use drugs.   Family History:  Her family history includes Brain cancer in her brother; Breast cancer in her maternal grandmother; Dementia in her father; Diabetes in her sister; Pneumonia in her father; Rheum arthritis in her sister; Stroke in her mother and paternal grandmother; Transient ischemic attack in her father.   Allergies Allergies  Allergen Reactions   Iodinated Contrast Media Hives and Rash   Atorvastatin Calcium Other (See Comments)    Musculoskeletal aches Other reaction(s): Other (See Comments) Musculoskeletal aches   Doxycycline Other (See Comments)    Liver problems Other reaction(s): Other (See Comments) Liver problems   Heparin Other (See  Comments)    Causes blood clots. Other reaction(s): Other (See Comments) Causes blood clots.   Lisinopril Other (See Comments)    cough Other reaction(s): Other (See Comments) cough   Lovastatin Diarrhea   Ropinirole Hcl Nausea Only     Home Medications  Prior to Admission medications   Medication Sig Start Date End Date Taking? Authorizing Provider  albuterol (VENTOLIN HFA) 108 (90 Base) MCG/ACT inhaler Inhale 2 puffs into the lungs every 6 (six) hours as needed for wheezing or shortness of breath. 10/02/21  Yes Kathie Dike, MD  Ascorbic Acid (VITAMIN C) 1000 MG tablet Take 1,000 mg by mouth 2 (two) times a day.   Yes [provider]  b complex vitamins tablet Take 2 tablets by mouth daily.   Yes [provider]  diltiazem (CARDIZEM CD) 180 MG 24 hr capsule Take 180 mg by mouth daily.   Yes [provider]  guaiFENesin (MUCINEX) 600 MG 12 hr tablet Take 1 tablet (600 mg total) by mouth 2 (two) times daily. 10/02/21  Yes Kathie Dike, MD  hydrALAZINE (APRESOLINE) 50 MG tablet Take 1 tablet (50 mg total) by mouth 3 (three) times daily. 10/02/21  Yes Kathie Dike, MD  isosorbide mononitrate (IMDUR) 30 MG 24 hr tablet Take 1 tablet (30 mg total) by mouth daily. 10/03/21  Yes Kathie Dike, MD  levothyroxine (SYNTHROID) 75 MCG tablet Take 75 mcg by mouth daily before breakfast.   Yes [provider]  Magnesium Malate 1250 (141.7 Mg) MG TABS Take 1,250 mg by mouth. Two tablets daily.   Yes [provider]  Misc Natural Products (YUMVS BEET ROOT-TART CHERRY PO) Take by mouth.   Yes [provider]  potassium chloride (KLOR-CON M) 10 MEQ tablet Take 10 mEq by mouth daily. 10/08/21  Yes [provider]  pregabalin (LYRICA) 100 MG capsule TAKE 2 CAPSULES AT BEDTIME. MAY TAKE AN EXTRA ONE IF NEEDED. 08/13/21  Yes Suzzanne Cloud, NP  Rotigotine (NEUPRO) 1 MG/24HR PT24 Place 1 patch (1 mg total) onto the skin daily. 08/13/21  Yes  Suzzanne Cloud, NP  sodium chloride 1 g tablet Take 1 g by mouth 3 (three) times daily. 08/26/21  Yes [provider]  torsemide (DEMADEX) 20 MG tablet Take 20 mg by mouth daily. 10/08/21  Yes [provider]  UNABLE TO FIND Take 500 mg by mouth daily. Med Name: Beet Root   Yes [provider]      Kara Mead MD. FCCP. [facility] Pager : [phone]  If no response to pager , please call [phone] until 7 pm After 7:00 pm call Elink  [phone]   10/16/2021

## 2021-10-16 NOTE — Progress Notes (Signed)
Sent Dr. Roger Shelter secure chat and made MD aware of serum K+ 2.8 this morning. MD acknowledged and gave no new orders at this time.

## 2021-10-16 NOTE — Progress Notes (Signed)
PROGRESS NOTE    Patient: [name]                            PCP: Glenda Chroman, MD                    DOB: Jul 22, 1945            DOA: 10/14/2021 ZJI:[id number]             DOS: 10/16/2021, 11:36 AM   LOS: 2 days   Date of Service: The patient was seen and examined on 10/16/2021  Subjective:   Was seen and examined this morning, awake alert oriented sitting up in chair stating feeling much better, status post successful paracentesis yielding 1.4 L yesterday.  She tolerated well.  Reporting improved lower extremity edema  Husband present at bedside patient is concerned of recurrence pleural effusion  Brief Narrative:   [name] is a 76 y.o. 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  Patient was admitted from 5/6 through 5/11 due to community acquired pneumonia with acute hypoxic respiratory failure and pleural effusion which required right-sided thoracentesis on 09/30/2021 with removal of 1.2 L of pleural fluid noted to be transudative in nature (considered to be parapneumonic in the setting of underlying infection), during hospitalization she was treated with IV Rocephin/azithromycin mucolytic's and bronchodilators and this was transitioned to oral antibiotics prior to discharge.  She also had syncope on that admission with echo revealing G1 DD. Patient now complains of worsening shortness of breath and increased work of breathing, she was discharged with supplemental oxygen at 2 LPM via [place], but patient has gradually increased this to 4 LPM.  She also complaining of increased leg swelling.  She had a follow-up with her PCP who prescribed furosemide, but there was some confusion  regarding the dosing, so there was delay in patient starting the medication which was just started today.  She was seen by home health normal who asked her to go to the ED for further evaluation and management.   ED Course:  She was found tachypneic, BP 153/89, O2 sat was 94-96% on 4 L supplemental oxygen .Work-up in the ED showed normocytic anemia, BMP was normal except for BUN of 24, creatinine was 1.03 (this ranged within 1.5-1.7 in the last 2 weeks). Chest x-ray (left decubitus) showed free-flowing left pleural effusion. Primarily free-flowing right pleural effusion. Portable chest x-ray showed moderate right-greater-than-left pleural effusions, increased in size when compared with prior exam.   Hospitalist was asked to admit patient for further evaluation and management.    Assessment & Plan:   Principal Problem:   Bilateral pleural effusion Active Problems:   Subdural hematoma (HCC)   Acute respiratory failure with hypoxia (HCC)   Diastolic CHF (HCC)   Prolonged QT interval   Acquired hypothyroidism   Essential hypertension     Assessment and Plan:  Acute respiratory failure with hypoxia possibly secondary to bilateral pleural effusion Portable chest x-ray showed moderate right-greater-than-left pleural effusions, increased in size when compared with prior exam. Patient had right-sided thoracentesis with aspiration of 1.2 L of fluid during last admission.  Cytology only reflected reactive mesothelial cells.  -Status post Rt thoracentesis  10/15/2021, yielding 1.4 L clear/yellow fluid -Improved shortness of breath  -Continuing IV Lasix 40 mg twice daily with repleting albumin,  Blood pressure has stabilized  -Still complaining of significant shortness of breath with minimal exertion, currently on 6 L of oxygen, satting 96%  -Repeat chest x-ray 10/16/2021: Still revealing opacity, with pleural effusion IMPRESSION: Significantly increased right perihilar opacity is noted  concerning for worsening pneumonia. Stable bilateral pleural effusions are noted with associated bibasilar atelectasis or infiltrates, left greater than right    Prolonged QTc (576 ms) Avoid QT prolonging drugs Magnesium 1.6 -repleting with 2 g of IV magnesium Potassium level will be optimized   Acute on chronic diastolic CHF grade 1-preserved ejection fraction Echocardiogram done on 09/28/2021 showed LVEF of 72%, but G1 DD -Considering that patient stopped taking Lasix until today 5/11, it is unknown if patient has developed concomitant systolic CHF.   BNP: Normal at 43.0 Limited echocardiogram >>> Continue total input/output, daily weights and fluid restriction Continue Cardiac diet   Hypoalbuminemia  -possibly secondary to mild protein calorie malnutrition Albumin 3.3,>>>2.9  protein supplement to be provided -Repleted with 25% of IV albumin x4 doses every 6 hours + 2 more doses   History of small right-sided traumatic subdural hematoma Stable   Hypothyroidism Continue Synthroid   Essential hypertension BP meds will be held at this time due to soft BP     Echocardiogram (09/28/2021) Impression 1.Left ventricular EJF 72 %. The left ventricle has hyperdynamic function. The left ventricle has no regional wall motion abnormalities. Left ventricular diastolic parameters are consistent with Grade I diastolic dysfunction (impaired relaxation). 2. Right ventricular systolic function is normal. The right ventricular size is normal. A small pericardial effusion is present. The pericardial effusion is anterior to the right ventricle. Large pleural effusion in the left lateral region. 3. The mitral valve is normal in structure. Trivial mitral valve regurgitation. No evidence of mitral stenosis. 4. The aortic valve is grossly normal. There is mild calcification of the aortic valve. Aortic valve regurgitation is trivial. No aortic stenosis is present. 5. The inferior vena cava is normal in  size with greater than 50% respiratory variability, suggesting right atrial pressure of 3 mmHg.    Consults: IR   Procedure: Thoracentesis 10/15/2021 Successful US guided right thoracentesis. Yielded 1.4 L of clear yellow fluid.  --------------------------------------------------------------------------------------------------------------- Nutritional status:  The patient's BMI is: Body mass index is 22.93 kg/m. I agree with the assessment and plan as outlined Nutrition Status:       -----------------------------------------------------------------------------------------------------------  DVT prophylaxis:  SCDs Start: 10/14/21 2149   Code Status:   Code Status: Full Code  Family Communication: Discussed with patient and her husband at bedside The above findings and plan of care has been discussed with patient in detail,  they expressed understanding and agreement of above. -Advance care planning has been discussed.   Admission status:   Status is: Inpatient Remains inpatient appropriate because: Supplemental oxygen follow-up with hypoxia, shortness of breath requiring thoracentesis for bilateral pleural effusion  Disposition: From home Anticipating discharging home in next 24-48 hours with home health     Procedures:   No admission procedures for hospital encounter.   Antimicrobials:  Anti-infectives (From admission, onward)    Start     Dose/Rate Route Frequency Ordered Stop   10/15/21 1230  nitrofurantoin (macrocrystal-monohydrate) (MACROBID) capsule 100 mg        100 mg Oral Daily 10/15/21 1132     10/15/21 1000  nitrofurantoin (MACRODANTIN) capsule 100 mg  Status:  Discontinued        100 mg Oral Daily 10/15/21 0716 10/15/21 1132        Medication:   diltiazem  180 mg Oral Daily   feeding supplement  237 mL Oral BID BM   furosemide  40 mg Intravenous Q12H   guaiFENesin  600 mg Oral BID   isosorbide mononitrate  30 mg Oral Daily   levothyroxine  75  mcg Oral QAC breakfast   nitrofurantoin (macrocrystal-monohydrate)  100 mg Oral Daily   [START ON 10/17/2021] potassium chloride  30 mEq Oral Daily   pregabalin  200 mg Oral QHS   Rotigotine  1 mg Transdermal Daily   saccharomyces boulardii  250 mg Oral Daily    albuterol   Objective:   Vitals:   10/15/21 1434 10/15/21 1946 10/16/21 0610 10/16/21 0803  BP: 127/60 (!) 154/75 (!) 140/57 (!) 150/57  Pulse: 85 90 86 88  Resp: '18 17 17 20  '$ Temp: 99 F (37.2 C) 99.7 F (37.6 C) 99.1 F (37.3 C) 98.2 F (36.8 C)  TempSrc: Oral   Oral  SpO2: 90% (!) 89% (!) 88% 96%  Weight:   60.6 kg   Height:        Intake/Output Summary (Last 24 hours) at 10/16/2021 1136 Last data filed at 10/15/2021 2227 Gross per 24 hour  Intake 596.35 ml  Output 1803 ml  Net -1206.65 ml   Filed Weights   10/14/21 1552 10/15/21 0600 10/16/21 0610  Weight: 68 kg 69.1 kg 60.6 kg     Examination:      Physical Exam:   General:  AAO x 3,  cooperative, no distress;   HEENT:  Normocephalic, PERRL, otherwise with in Normal limits   Neuro:  CNII-XII intact. , normal motor and sensation, reflexes intact   Lungs:   Clear to auscultation BL, Respirations unlabored,  No wheezes /mild bilateral lower lobe crackles..  Significant Rales,  Cardio:    S1/S2, RRR, No murmure, No Rubs or Gallops   Abdomen:  Soft, non-tender, bowel sounds active all four quadrants, no guarding or peritoneal signs.  Muscular  skeletal:  Limited exam -global generalized weaknesses - in bed, able to move all 4 extremities,   2+ pulses,  symmetric, +2  pitting edema  Skin:  Dry, warm to touch, negative for any Rashes,  Wounds: Please see nursing documentation        --------------------------------------------------------------------------------------------------------------    LABs:     Latest Ref Rng & Units 10/16/2021    4:58 AM 10/15/2021    4:52 AM 10/14/2021    6:45 PM  CBC  WBC 4.0 - 10.5 K/uL 10.5   7.0   10.1     Hemoglobin 12.0 - 15.0 g/dL 8.0   9.4   10.6    Hematocrit 36.0 - 46.0 % 24.1   28.8   31.7    Platelets 150 - 400 K/uL 163   216   238        Latest Ref Rng & Units 10/16/2021    4:58 AM 10/15/2021    4:52 AM 10/14/2021    6:45 PM  CMP  Glucose 70 - 99 mg/dL 98   94   107    BUN 8 - 23 mg/dL '18   21   24    '$ Creatinine 0.44 - 1.00 mg/dL 1.04   0.95   1.03    Sodium 135 - 145 mmol/L 139   141   138  Potassium 3.5 - 5.1 mmol/L 2.8   3.8   3.5    Chloride 98 - 111 mmol/L 101   108   106    CO2 22 - 32 mmol/L '30   27   25    '$ Calcium 8.9 - 10.3 mg/dL 7.4   7.9   8.2    Total Protein 6.5 - 8.1 g/dL  5.5   6.3    Total Bilirubin 0.3 - 1.2 mg/dL  0.5   0.6    Alkaline Phos 38 - 126 U/L  134   153    AST 15 - 41 U/L  21   27    ALT 0 - 44 U/L  27   32         Micro Results Recent Results (from the past 240 hour(s))  Culture, body fluid w Gram Stain-bottle     Status: None (Preliminary result)   Collection Time: 10/15/21 10:45 AM   Specimen: Pleura  Result Value Ref Range Status   Specimen Description PLEURAL  Final   Special Requests   Final    BOTTLES DRAWN AEROBIC AND ANAEROBIC Blood Culture adequate volume   Culture   Final    NO GROWTH < 24 HOURS Performed at [facility], [address], [place], [place] [address]    Report Status PENDING  Incomplete  Gram stain     Status: None   Collection Time: 10/15/21 10:45 AM   Specimen: Pleura  Result Value Ref Range Status   Specimen Description PLEURAL  Final   Special Requests NONE  Final   Gram Stain   Final    CYTOSPIN SMEAR NO ORGANISMS SEEN WBC PRESENT,BOTH PMN AND MONONUCLEAR Performed at [facility], [address] [place], [place] [address]    Report Status 10/15/2021 FINAL  Final    Radiology Reports DG Chest 2 View  Result Date: 10/16/2021 CLINICAL DATA:  Shortness of breath. EXAM: CHEST - 2 VIEW COMPARISON:  Oct 15, 2021. FINDINGS: Stable moderate left pleural effusion is noted with associated left lower lobe  pneumonia or atelectasis. Significantly increased right perihilar opacity is noted most consistent with pneumonia. Stable mild right pleural effusion is noted with associated right basilar atelectasis or infiltrate. Stable cardiomediastinal silhouette. Bony thorax is unremarkable. IMPRESSION: Significantly increased right perihilar opacity is noted concerning for worsening pneumonia. Stable bilateral pleural effusions are noted with associated bibasilar atelectasis or infiltrates, left greater than right. Electronically Signed   By: Marijo Conception M.D.   On: 10/16/2021 09:11    SIGNED: Deatra James, MD, FHM. [facility],  Pager (please use [email or web address] to page/text) Please use Epic Secure Chat for non-urgent communication (7AM-7PM)  If 7PM-7AM, please contact night-coverage [email or web address], 10/16/2021, 11:36 AM

## 2021-10-16 NOTE — Progress Notes (Signed)
Spoke with Dr. Elsworth Soho via secure chat and discussed with MD that patient received her daily dose of 50 mg of oral prednisone at 1406 today and RN asked MD if this dose that was already given should be included as her first dose of pre medication prednisone for CT angio. Dr. Elsworth Soho stated yes to include the 1406 dose of prednisone as her first pre medication dose. Therefore patient will only need two more pre medication doses of prednisone. Notified pharmacy. Next doses are scheduled at [place] and 0200 10/17/21. RN also moved benadryl to be given at 0200 with last dose of prednisone and notified April Rickman in CT of pre medication schedule so that CT should be done at 0300.

## 2021-10-17 ENCOUNTER — Other Ambulatory Visit (HOSPITAL_COMMUNITY): Payer: Self-pay | Admitting: *Deleted

## 2021-10-17 ENCOUNTER — Inpatient Hospital Stay (HOSPITAL_COMMUNITY): Payer: Medicare PPO

## 2021-10-17 DIAGNOSIS — I2699 Other pulmonary embolism without acute cor pulmonale: Secondary | ICD-10-CM

## 2021-10-17 DIAGNOSIS — Z9889 Other specified postprocedural states: Secondary | ICD-10-CM

## 2021-10-17 DIAGNOSIS — I2609 Other pulmonary embolism with acute cor pulmonale: Secondary | ICD-10-CM

## 2021-10-17 DIAGNOSIS — Z86718 Personal history of other venous thrombosis and embolism: Secondary | ICD-10-CM

## 2021-10-17 DIAGNOSIS — J90 Pleural effusion, not elsewhere classified: Secondary | ICD-10-CM

## 2021-10-17 DIAGNOSIS — I871 Compression of vein: Secondary | ICD-10-CM

## 2021-10-17 LAB — CBC
HCT: 27.3 % — ABNORMAL LOW (ref 36.0–46.0)
Hemoglobin: 9.1 g/dL — ABNORMAL LOW (ref 12.0–15.0)
MCH: 31.7 pg (ref 26.0–34.0)
MCHC: 33.3 g/dL (ref 30.0–36.0)
MCV: 95.1 fL (ref 80.0–100.0)
Platelets: 170 10*3/uL (ref 150–400)
RBC: 2.87 MIL/uL — ABNORMAL LOW (ref 3.87–5.11)
RDW: 14.1 % (ref 11.5–15.5)
WBC: 10.5 10*3/uL (ref 4.0–10.5)
nRBC: 0 % (ref 0.0–0.2)

## 2021-10-17 LAB — MAGNESIUM: Magnesium: 2.2 mg/dL (ref 1.7–2.4)

## 2021-10-17 LAB — ECHOCARDIOGRAM COMPLETE
Area-P 1/2: 2.79 cm2
Calc EF: 57.4 %
Height: 64 in
S' Lateral: 2.5 cm
Single Plane A2C EF: 54.8 %
Single Plane A4C EF: 59.5 %
Weight: 2102.31 oz

## 2021-10-17 LAB — CYTOLOGY - NON PAP

## 2021-10-17 LAB — BASIC METABOLIC PANEL
Anion gap: 11 (ref 5–15)
BUN: 26 mg/dL — ABNORMAL HIGH (ref 8–23)
CO2: 28 mmol/L (ref 22–32)
Calcium: 7.5 mg/dL — ABNORMAL LOW (ref 8.9–10.3)
Chloride: 99 mmol/L (ref 98–111)
Creatinine, Ser: 1.02 mg/dL — ABNORMAL HIGH (ref 0.44–1.00)
GFR, Estimated: 57 mL/min — ABNORMAL LOW (ref >60)
Glucose, Bld: 136 mg/dL — ABNORMAL HIGH (ref 70–99)
Potassium: 3.1 mmol/L — ABNORMAL LOW (ref 3.5–5.1)
Sodium: 138 mmol/L (ref 135–145)

## 2021-10-17 LAB — MRSA NEXT GEN BY PCR, NASAL: MRSA by PCR Next Gen: NOT DETECTED

## 2021-10-17 MED ORDER — FUROSEMIDE 40 MG PO TABS
40.0000 mg | ORAL_TABLET | Freq: Every day | ORAL | Status: DC
  Administered 2021-10-17 – 2021-10-18 (×2): 40 mg via ORAL
  Filled 2021-10-17 (×2): qty 1

## 2021-10-17 MED ORDER — APIXABAN 5 MG PO TABS
5.0000 mg | ORAL_TABLET | Freq: Two times a day (BID) | ORAL | Status: DC
Start: 2021-10-24 — End: 2021-10-17

## 2021-10-17 MED ORDER — ARGATROBAN 50 MG/50ML IV SOLN
0.5000 ug/kg/min | INTRAVENOUS | Status: DC
  Administered 2021-10-17: 0.5 ug/kg/min via INTRAVENOUS
  Filled 2021-10-17 (×2): qty 50

## 2021-10-17 MED ORDER — IOHEXOL 350 MG/ML SOLN
100.0000 mL | Freq: Once | INTRAVENOUS | Status: AC | PRN
  Administered 2021-10-17: 100 mL via INTRAVENOUS

## 2021-10-17 MED ORDER — APIXABAN 5 MG PO TABS: 5.0000 mg | ORAL_TABLET | Freq: Two times a day (BID) | ORAL | Status: DC

## 2021-10-17 MED ORDER — APIXABAN 5 MG PO TABS
10.0000 mg | ORAL_TABLET | Freq: Two times a day (BID) | ORAL | Status: DC
Start: 2021-10-19 — End: 2021-10-18

## 2021-10-17 MED ORDER — APIXABAN 5 MG PO TABS
10.0000 mg | ORAL_TABLET | Freq: Two times a day (BID) | ORAL | Status: DC
Start: 2021-10-17 — End: 2021-10-17
  Administered 2021-10-17: 10 mg via ORAL
  Filled 2021-10-17: qty 2

## 2021-10-17 NOTE — Progress Notes (Signed)
Report called to [facility] MSN [facility]-2C progressive care. All questions answered to [facility] satisfaction

## 2021-10-17 NOTE — Progress Notes (Addendum)
PROGRESS NOTE    Patient: [name]                            PCP: Glenda Chroman, MD                    DOB: 1945-06-19            DOA: 10/14/2021 NTI:[id number]             DOS: 10/17/2021, 11:25 AM   LOS: 3 days   Date of Service: The patient was seen and examined on 10/17/2021  Subjective:   The patient was seen and examined this morning, remains on 6 L of oxygen, satting 97%,  Patient has had CT angiogram yesterday revealing bilateral PE,-bilateral pleural effusion  Husband present at bedside patient is concerned of recurrence pleural effusion  Brief Narrative:   [name] is a 76 y.o. 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  Patient was admitted from 5/6 through 5/11 due to community acquired pneumonia with acute hypoxic respiratory failure and pleural effusion which required right-sided thoracentesis on 09/30/2021 with removal of 1.2 L of pleural fluid noted to be transudative in nature (considered to be parapneumonic in the setting of underlying infection), during hospitalization she was treated with IV Rocephin/azithromycin mucolytic's and bronchodilators and this was transitioned to oral antibiotics prior to discharge.  She also had syncope on that admission with echo revealing G1 DD. Patient now complains of worsening shortness of breath and increased work of breathing, she was discharged with supplemental oxygen at 2 LPM via [place], but patient has gradually increased this to 4 LPM.  She also complaining of increased leg swelling.  She had a follow-up with her PCP who prescribed furosemide, but there was some confusion regarding the dosing, so there was delay in patient  starting the medication which was just started today.  She was seen by home health normal who asked her to go to the ED for further evaluation and management.   ED Course:  She was found tachypneic, BP 153/89, O2 sat was 94-96% on 4 L supplemental oxygen .Work-up in the ED showed normocytic anemia, BMP was normal except for BUN of 24, creatinine was 1.03 (this ranged within 1.5-1.7 in the last 2 weeks). Chest x-ray (left decubitus) showed free-flowing left pleural effusion. Primarily free-flowing right pleural effusion. Portable chest x-ray showed moderate right-greater-than-left pleural effusions, increased in size when compared with prior exam.   Hospitalist was asked to admit patient for further evaluation and management.    Assessment & Plan:   Principal Problem:   Bilateral pleural effusion Active Problems:   Subdural hematoma (HCC)   Acute respiratory failure with hypoxia (HCC)   Bilateral pulmonary embolism (HCC)   Diastolic CHF (HCC)   Prolonged QT interval   Acquired hypothyroidism   Essential hypertension     Assessment and Plan:  Acute respiratory failure with hypoxia possibly secondary to bilateral pleural effusion-May be exacerbated by bilateral pulmonary embolism  Portable chest x-ray showed moderate right-greater-than-left pleural effusions, increased in size when compared with prior exam. Patient had right-sided thoracentesis with aspiration of 1.2 L of fluid during last admission.  Cytology only reflected reactive mesothelial  cells.  -5/9 right thoracenteses >> 1.2 L fluid removed , On this admission -Status post Rt thoracentesis 10/15/2021, yielding 1.4 L clear/yellow fluid -Status post left thoracentesis 25 2023 yielding 800 mL fluid (Unfortunately fluid was discarded, labs, cytology could not be obtained)  -Still complaining shortness of breath, on 6 L of oxygen, satting 97% -Improved shortness of breath -Continuing IV Lasix 40 mg twice daily with repleting  albumin,  Blood pressure has stabilized  Pulmonology was consulted: CT angiogram was obtained, revealing bilateral pulmonary embolism, no evidence of right heart strain, moderate bilateral pleural effusion Rt> Lt,  Consolidation in the inferior and posterior right upper lobe with areas of patchy ground-glass opacities in both upper lobes. Findings most compatible with pneumonia.  -Anticoagulation treatment was discussed in detail with the patient and her husband at bedside, the pros and cons were discussed in detail-especially with her recent history of fall and small subdural hematoma  -Obtaining CT of the head today-reported resolution of his small subdural hematoma  Addendum: CT angiogram finding was discussed with Dr. Elsworth Soho and radiologist Subsequently called vascular surgeon Dr. 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 Donzetta Matters recommended patient be transferred to [facility] for further evaluation of possible stenting  -Patient was started on Eliquis for stroke was given today 10/17/2021, further dosing will be held pending evaluation by vascular surgeon -procedure intervention  -Patient is allergic to heparin, due to complication previously?  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 For avoiding all heparin products and heparin drip     Prolonged QTc (576 ms) Avoid QT prolonging drugs Magnesium 1.6 -repleting with 2 g of IV magnesium Potassium level will be optimized -Monitoring labs, repeating EKG -   Acute on chronic diastolic CHF grade 1-preserved ejection fraction Echocardiogram done on 09/28/2021 showed LVEF of 72%, but G1 DD -Considering that patient stopped taking Lasix until today 5/11, it is unknown if patient has developed concomitant systolic CHF.   BNP: Normal at 43.0 Limited echocardiogram >>> reviewed, negative for any right heart strain, borderline LVH, ejection fraction 70-75%, very  small pericardial effusion  Continue total input/output, daily weights and fluid restriction Continue Cardiac diet  Will remain on IV Lasix 40 twice daily for now  Hypoalbuminemia  -possibly secondary to mild protein calorie malnutrition Albumin 3.3,>>>2.9  protein supplement to be provided -Repleted with 25% of IV albumin x4 doses every 6 hours + 2 more doses   History of small right-sided traumatic subdural hematoma Stable -CT of the head, initiating Eliquis for bilateral PE Pros and cons of anticoagulation and head bleed has been discussed with the patient and husband in detail-expressed understanding and agreement   Hypothyroidism Continue Synthroid   Essential hypertension BP meds will be held at this time due to soft BP     Echocardiogram (09/28/2021) Impression 1.Left ventricular EJF 72 %. The left ventricle has hyperdynamic function. The left ventricle has no regional wall motion abnormalities. Left ventricular diastolic parameters are consistent with Grade I diastolic dysfunction (impaired relaxation). 2. Right ventricular systolic function is normal. The right ventricular size is normal. A small pericardial effusion is present. The pericardial effusion is anterior to the right ventricle. Large pleural effusion in the left lateral region.   Consults: IR/pulmonologist Dr. Nigel Berthold surgeon Dr. Donzetta Matters   Procedure: Thoracentesis . -09/30/2021 right thoracenteses >> 1.2 L fluid removed -Status post Rt thoracentesis 10/15/2021, yielding 1.4 L clear/yellow fluid -Status post left  thoracentesis 25 2023 yielding 800 mL fluid  --------------------------------------------------------------------------------------------------------------- Nutritional status:  The patient's BMI is: Body mass index is 22.55 kg/m. I agree with the assessment and plan as outlined Nutrition Status:        -----------------------------------------------------------------------------------------------------------  DVT prophylaxis:  SCDs Start: 10/14/21 2149 apixaban (ELIQUIS) tablet 10 mg  apixaban (ELIQUIS) tablet 5 mg   Code Status:   Code Status: Full Code  Family Communication: Discussed with patient and her husband at bedside The above findings and plan of care has been discussed with patient in detail,  they expressed understanding and agreement of above. -Advance care planning has been discussed.   Admission status:   Status is: Inpatient Remains inpatient appropriate because: Supplemental oxygen follow-up with hypoxia, shortness of breath requiring thoracentesis for bilateral pleural effusion  Disposition: From home Anticipating discharging home in next 24-48 hours with home health     Procedures:   No admission procedures for hospital encounter.   Antimicrobials:  Anti-infectives (From admission, onward)    Start     Dose/Rate Route Frequency Ordered Stop   10/15/21 1230  nitrofurantoin (macrocrystal-monohydrate) (MACROBID) capsule 100 mg        100 mg Oral Daily 10/15/21 1132     10/15/21 1000  nitrofurantoin (MACRODANTIN) capsule 100 mg  Status:  Discontinued        100 mg Oral Daily 10/15/21 0716 10/15/21 1132        Medication:   apixaban  10 mg Oral BID   Followed by   Derrill Memo ON 10/24/2021] apixaban  5 mg Oral BID   diltiazem  180 mg Oral Daily   feeding supplement  237 mL Oral BID BM   furosemide  40 mg Intravenous Q12H   guaiFENesin  600 mg Oral BID   isosorbide mononitrate  30 mg Oral Daily   levothyroxine  75 mcg Oral QAC breakfast   nitrofurantoin (macrocrystal-monohydrate)  100 mg Oral Daily   potassium chloride  30 mEq Oral Daily   predniSONE  50 mg Oral Q breakfast   pregabalin  200 mg Oral QHS   Rotigotine  1 mg Transdermal Daily   saccharomyces boulardii  250 mg Oral Daily    albuterol   Objective:   Vitals:   10/16/21 1418  10/16/21 2055 10/17/21 0318 10/17/21 0912  BP: 102/61 122/67 122/68 (!) 118/92  Pulse: 75 80 80   Resp: '20 17 18   '$ Temp: 98 F (36.7 C) 98.7 F (37.1 C) 98.1 F (36.7 C)   TempSrc: Oral     SpO2: 94% 95% 97%   Weight:   59.6 kg   Height:        Intake/Output Summary (Last 24 hours) at 10/17/2021 1125 Last data filed at 10/17/2021 0900 Gross per 24 hour  Intake 763.4 ml  Output 350 ml  Net 413.4 ml   Filed Weights   10/15/21 0600 10/16/21 0610 10/17/21 0318  Weight: 69.1 kg 60.6 kg 59.6 kg     Examination:       Physical Exam:   General:  AAO x 3,  cooperative, no distress; SOB  HEENT:  Normocephalic, PERRL, otherwise with in Normal limits   Neuro:  CNII-XII intact. , normal motor and sensation, reflexes intact   Lungs:   Clear to auscultation BL, Respirations unlabored,  No wheezes / crackles  Cardio:    S1/S2, RRR, No murmure, No Rubs or Gallops   Abdomen:  soft, non-tender, bowel sounds active all four quadrants, no guarding or peritoneal  signs.  Muscular  skeletal:  Amputated left arm  Limited exam -global generalized weaknesses - in bed, able to move all 4 extremities,   2+ pulses,  symmetric, +1 pitting edema  Skin:  Dry, warm to touch, negative for any Rashes,  Wounds: Please see nursing documentation        --------------------------------------------------------------------------------------------    LABs:     Latest Ref Rng & Units 10/17/2021    5:13 AM 10/16/2021    4:58 AM 10/15/2021    4:52 AM  CBC  WBC 4.0 - 10.5 K/uL 10.5   10.5   7.0    Hemoglobin 12.0 - 15.0 g/dL 9.1   8.0   9.4    Hematocrit 36.0 - 46.0 % 27.3   24.1   28.8    Platelets 150 - 400 K/uL 170   163   216        Latest Ref Rng & Units 10/17/2021    5:13 AM 10/16/2021    4:58 AM 10/15/2021    4:52 AM  CMP  Glucose 70 - 99 mg/dL 136   98   94    BUN 8 - 23 mg/dL '26   18   21    '$ Creatinine 0.44 - 1.00 mg/dL 1.02   1.04   0.95    Sodium 135 - 145 mmol/L 138   139   141     Potassium 3.5 - 5.1 mmol/L 3.1   2.8   3.8    Chloride 98 - 111 mmol/L 99   101   108    CO2 22 - 32 mmol/L '28   30   27    '$ Calcium 8.9 - 10.3 mg/dL 7.5   7.4   7.9    Total Protein 6.5 - 8.1 g/dL   5.5    Total Bilirubin 0.3 - 1.2 mg/dL   0.5    Alkaline Phos 38 - 126 U/L   134    AST 15 - 41 U/L   21    ALT 0 - 44 U/L   27         Micro Results Recent Results (from the past 240 hour(s))  Culture, body fluid w Gram Stain-bottle     Status: None (Preliminary result)   Collection Time: 10/15/21 10:45 AM   Specimen: Pleura  Result Value Ref Range Status   Specimen Description PLEURAL  Final   Special Requests   Final    BOTTLES DRAWN AEROBIC AND ANAEROBIC Blood Culture adequate volume   Culture   Final    NO GROWTH 1 DAY Performed at [facility], [address] [place], [place] [address]    Report Status PENDING  Incomplete  Gram stain     Status: None   Collection Time: 10/15/21 10:45 AM   Specimen: Pleura  Result Value Ref Range Status   Specimen Description PLEURAL  Final   Special Requests NONE  Final   Gram Stain   Final    CYTOSPIN SMEAR NO ORGANISMS SEEN WBC PRESENT,BOTH PMN AND MONONUCLEAR Performed at [facility], [address] [place], [place] [address]    Report Status 10/15/2021 FINAL  Final    Radiology Reports DG Chest 1 View  Result Date: 10/16/2021 CLINICAL DATA:  Post thoracentesis EXAM: CHEST  1 VIEW COMPARISON:  Exam at 1331 hours compared to 0836 hours FINDINGS: Decreased LEFT pleural effusion and basilar atelectasis post thoracentesis. No pneumothorax. Stable RIGHT basilar opacity and small but pleural effusion. Questionable  residual infiltrate versus nodular opacity in RIGHT upper lobe. Atherosclerotic calcification aorta. Heart size stable. IMPRESSION: No pneumothorax following LEFT thoracentesis. Remainder of exam unchanged from earlier study. Electronically Signed   By: Lavonia Dana M.D.   On: 10/16/2021 14:08   CT Angio Chest Pulmonary  Embolism (PE) W or WO Contrast  Result Date: 10/17/2021 CLINICAL DATA:  Pleural effusion, known or suspected (Ped 0-17y). Bilateral pleural effusions, shortness of breath. Question pulmonary embolus. EXAM: CT ANGIOGRAPHY CHEST WITH CONTRAST TECHNIQUE: Multidetector CT imaging of the chest was performed using the standard protocol during bolus administration of intravenous contrast. Multiplanar CT image reconstructions and MIPs were obtained to evaluate the vascular anatomy. RADIATION DOSE REDUCTION: This exam was performed according to the departmental dose-optimization program which includes automated exposure control, adjustment of the mA and/or kV according to patient size and/or use of iterative reconstruction technique. CONTRAST:  161m OMNIPAQUE IOHEXOL 350 MG/ML SOLN. Patient received 13 hour premedication for contrast allergy. No immediate complications. COMPARISON:  09/27/2021 FINDINGS: Cardiovascular: There are bilateral pulmonary emboli in the upper lobes bilaterally. No evidence of right heart strain. Heart is mildly enlarged. Aorta normal caliber. Scattered coronary artery and aortic calcifications. Extensive collateral venous channels in the right chest wall with non opacification of the right subclavian vein, likely related to chronic stenosis or occlusion. Mediastinum/Nodes: No mediastinal, hilar, or axillary adenopathy. Trachea and esophagus are unremarkable. Thyroid unremarkable. Lungs/Pleura: Moderate to large bilateral pleural effusions, right greater than left, stable since prior study. Compressive atelectasis in the lower lobes. Areas of consolidation in the inferior and posterior right upper lobe concerning for pneumonia. Patchy bilateral upper lobe ground-glass opacities, likely pneumonia. Upper Abdomen: No acute findings Musculoskeletal: No acute bony abnormality. Review of the MIP images confirms the above findings. IMPRESSION: Bilateral upper lobe pulmonary emboli. No evidence of right  heart strain. Moderate to large bilateral pleural effusions, right greater than left with compressive atelectasis in the lower lobes, stable since prior study. Consolidation in the inferior and posterior right upper lobe with areas of patchy ground-glass opacities in both upper lobes. Findings most compatible with pneumonia. Mild cardiomegaly. Aortic Atherosclerosis (ICD10-I70.0). These results will be called to the ordering clinician or representative by the Radiologist Assistant, and communication documented in the PACS or [employer] Electronically Signed   By: KRolm BaptiseM.D.   On: 10/17/2021 03:21   [place]ORACENTESIS ASP PLEURAL SPACE W/IMG GUIDE  Result Date: 10/16/2021 INDICATION: Hypoxemia, LEFT pleural effusion, post RIGHT thoracentesis yesterday EXAM: ULTRASOUND GUIDED THERAPEUTIC LEFT THORACENTESIS MEDICATIONS: None. COMPLICATIONS: None immediate. PROCEDURE: An ultrasound guided thoracentesis was thoroughly discussed with the patient and questions answered. The benefits, risks, alternatives and complications were also discussed. The patient understands and wishes to proceed with the procedure. Written consent was obtained. Ultrasound was performed to localize and mark an adequate pocket of fluid in the LEFT chest. The area was then prepped and draped in the normal sterile fashion. 1% Lidocaine was used for local anesthesia. Under ultrasound guidance a 5 [place] catheter was introduced. Thoracentesis was performed. The catheter was removed and a dressing applied. FINDINGS: A total of approximately 800 mL of yellow fluid was removed. IMPRESSION: Successful ultrasound guided LEFT thoracentesis yielding 800 mL of pleural fluid. Electronically Signed   By: MLavonia DanaM.D.   On: 10/16/2021 14:11    SIGNED: SDeatra James MD, FHM. [facility],  Pager (please use [email or web address] to page/text) Please use Epic Secure Chat for non-urgent communication (7AM-7PM)  If 7PM-7AM, please contact  night-coverage [email or web address], 10/17/2021, 11:25 AM

## 2021-10-17 NOTE — Care Management Important Message (Signed)
Important Message  Patient Details  Name: [name] MRN: [id number] Date of Birth: [birth year]   Medicare Important Message Given:  Yes     Tommy Medal 10/17/2021, 11:27 AM

## 2021-10-17 NOTE — Discharge Instructions (Signed)
Information on my medicine - ELIQUIS (apixaban)  This medication education was reviewed with me or my healthcare representative as part of my discharge preparation.   Why was Eliquis prescribed for you? Eliquis was prescribed to treat blood clots that may have been found in the veins of your legs (deep vein thrombosis) or in your lungs (pulmonary embolism) and to reduce the risk of them occurring again.  What do You need to know about Eliquis ? The starting dose is 10 mg (two 5 mg tablets) taken TWICE daily for the FIRST SEVEN (7) DAYS, then on 10/24/2021 the dose is reduced to ONE 5 mg tablet taken TWICE daily.  Eliquis may be taken with or without food.   Try to take the dose about the same time in the morning and in the evening. If you have difficulty swallowing the tablet whole please discuss with your pharmacist how to take the medication safely.  Take Eliquis exactly as prescribed and DO NOT stop taking Eliquis without talking to the doctor who prescribed the medication.  Stopping may increase your risk of developing a new blood clot.  Refill your prescription before you run out.  After discharge, you should have regular check-up appointments with your healthcare provider that is prescribing your Eliquis.    What do you do if you miss a dose? If a dose of ELIQUIS is not taken at the scheduled time, take it as soon as possible on the same day and twice-daily administration should be resumed. The dose should not be doubled to make up for a missed dose.  Important Safety Information A possible side effect of Eliquis is bleeding. You should call your healthcare provider right away if you experience any of the following: Bleeding from an injury or your nose that does not stop. Unusual colored urine (red or dark brown) or unusual colored stools (red or black). Unusual bruising for unknown reasons. A serious fall or if you hit your head (even if there is no bleeding).  Some  medicines may interact with Eliquis and might increase your risk of bleeding or clotting while on Eliquis. To help avoid this, consult your healthcare provider or pharmacist prior to using any new prescription or non-prescription medications, including herbals, vitamins, non-steroidal anti-inflammatory drugs (NSAIDs) and supplements.  This website has more information on Eliquis (apixaban): http://www.eliquis.com/eliquis/home

## 2021-10-17 NOTE — Progress Notes (Signed)
EKG done and results placed in patients cart.

## 2021-10-17 NOTE — Progress Notes (Addendum)
Patient taken down for CT Scan at 0300. Radiology called stating report showing bilateral pulmonary emboli with no evidence of right heart strain at 0335. Notified Dr. Clearence Ped to make aware. Awaiting orders.  0500 new orders for echocardiogram and heparin per pharmacy consult.

## 2021-10-17 NOTE — TOC Initial Note (Signed)
Transition of Care [facility]) - Initial/Assessment Note    Patient Details  Name: [name] MRN: [id number] Date of Birth: May 10, 1946  [facility]) CM/SW Contact:    Iona Beard, [place] Phone Number: 10/17/2021, 12:21 PM  Clinical Narrative:                 Pt is high risk for readmission. CSW spoke with pts husband to complete assessment. Pts husband is able to provide assistance when needed. Pt is active with [facility] services. CSW requested that [facility] PT orders. Pt is chronic on 3/4L home O2. Pt has a wheelchair. Pts husband inquired about a transport chair. CSW spoke to [place] with [employer] who states pts PCP has ordered one and they need to pay the copay before receiving. CSW spoke with pts husband and updated on need for payment and the number to call to complete this. TOC to follow.  Expected Discharge Plan: [place] Barriers to Discharge: Continued Medical Work up   Patient Goals and CMS Choice Patient states their goals for this hospitalization and ongoing recovery are:: return home CMS Medicare.gov Compare Post Acute Care list provided to:: Patient Choice offered to / list presented to : Patient  Expected Discharge Plan and Services Expected Discharge Plan: [place] In-house Referral: Clinical Social Work   Post Acute Care Choice: [place] arrangements for the past 2 months: [place]                                      Prior Living Arrangements/Services Living arrangements for the past 2 months: Single Family Home Lives with:: Spouse Patient language and need for interpreter reviewed:: Yes Do you feel safe going back to the place where you live?: Yes      Need for Family Participation in Patient Care: Yes (Comment) Care giver support system in place?: Yes (comment) Current home services: DME Criminal Activity/Legal Involvement Pertinent to Current Situation/Hospitalization: No -  Comment as needed  Activities of Daily Living Home Assistive Devices/Equipment: None ADL Screening (condition at time of admission) Patient's cognitive ability adequate to safely complete daily activities?: Yes Is the patient deaf or have difficulty hearing?: No Does the patient have difficulty seeing, even when wearing glasses/contacts?: No Does the patient have difficulty concentrating, remembering, or making decisions?: No Patient able to express need for assistance with ADLs?: Yes Does the patient have difficulty dressing or bathing?: Yes Independently performs ADLs?: No Communication: Independent Dressing (OT): Needs assistance Is this a change from baseline?: Pre-admission baseline Grooming: Needs assistance Is this a change from baseline?: Pre-admission baseline Feeding: Independent Bathing: Needs assistance Is this a change from baseline?: Pre-admission baseline Toileting: Needs assistance Is this a change from baseline?: Pre-admission baseline In/Out Bed: Needs assistance Is this a change from baseline?: Pre-admission baseline Walks in Home: Independent Does the patient have difficulty walking or climbing stairs?: Yes Weakness of Legs: Both Weakness of Arms/Hands: None  Permission Sought/Granted                  Emotional Assessment Appearance:: Appears stated age Attitude/Demeanor/Rapport: Engaged Affect (typically observed): Accepting Orientation: : Oriented to Self, Oriented to Place, Oriented to  Time, Oriented to Situation Alcohol / Substance Use: Not Applicable Psych Involvement: No (comment)  Admission diagnosis:  Bilateral pleural effusion [J90] Patient Active Problem List  Diagnosis Date Noted   Bilateral pulmonary embolism ([place]) 10/17/2021   Bilateral pleural effusion 10/14/2021   Acute respiratory failure with hypoxia ([place]) 17/71/1657   Diastolic CHF ([place]) 90/38/3338   Prolonged QT interval 10/14/2021   Acquired hypothyroidism 10/14/2021    Essential hypertension 10/14/2021   Pleural effusion-Rt >> Lt 09/30/2021   CAP (community acquired pneumonia) 09/27/2021   Acute renal injury ([place]) 09/27/2021   Hypercalcemia 09/27/2021   Syncopal episodes 09/27/2021   Subdural hematoma ([place]) 09/27/2021   Acute cystitis without hematuria 07/12/2021   AMS (altered mental status) 07/12/2021   Generalized weakness 07/12/2021   Hypochloremia 07/12/2021   Hypokalemia 07/12/2021   Hyponatremia 07/12/2021   Closed fracture of right distal radius and ulna 02/23/2019   Chronic insomnia 12/15/2018   Restless leg syndrome 12/15/2018   Paresthesia 12/15/2018   PCP:  Glenda Chroman, MD Pharmacy:   [facility], [place] [address] Phone: [phone] Fax: [phone]  [facility] [place], [place] [address] Phone: [phone] Fax: [phone]     Social Determinants of Health (SDOH) Interventions    Readmission Risk Interventions    10/17/2021   12:19 PM 10/02/2021    3:25 PM  Readmission Risk Prevention Plan  Transportation Screening Complete Complete  PCP or Specialist Appt within 5-7 Days  Complete  Home Care Screening  Complete  Medication Review (RN CM)  Complete  HRI or Home Care Consult Complete   Social Work Consult for [place] Planning/Counseling Complete   Palliative Care Screening Not Applicable   Medication Review Press photographer) Complete

## 2021-10-17 NOTE — Progress Notes (Signed)
ANTICOAGULATION CONSULT NOTE - Initial Consult  Pharmacy Consult for argatroban Indication: pulmonary embolus  Allergies  Allergen Reactions   Iodinated Contrast Media Hives and Rash   Atorvastatin Calcium Other (See Comments)    Musculoskeletal aches Other reaction(s): Other (See Comments) Musculoskeletal aches   Doxycycline Other (See Comments)    Liver problems Other reaction(s): Other (See Comments) Liver problems   Heparin Other (See Comments)    Causes blood clots. Other reaction(s): Other (See Comments) Causes blood clots.   Lisinopril Other (See Comments)    cough Other reaction(s): Other (See Comments) cough   Lovastatin Diarrhea   Ropinirole Hcl Nausea Only    Patient Measurements: Height: '5\' 4"'$  (162.6 cm) Weight: 60.6 kg (133 lb 9.6 oz) (standing scale) IBW/kg (Calculated) : 54.7  Vital Signs: Temp: 98.1 F (36.7 C) (05/26 0318) BP: 122/68 (05/26 0318) Pulse Rate: 80 (05/26 0318)  Labs: Recent Labs    10/14/21 1845 10/15/21 0452 10/16/21 0458  HGB 10.6* 9.4* 8.0*  HCT 31.7* 28.8* 24.1*  PLT 238 216 163  CREATININE 1.03* 0.95 1.04*    Estimated Creatinine Clearance: 40.4 mL/min (A) (by C-G formula based on SCr of 1.04 mg/dL (H)).   Medical History: Past Medical History:  Diagnosis Date   Dysphasia    Endometrial cancer ([place])    Heel spur    Heparin induced thrombocytopenia (HCC)    Hypercholesteremia    Hypertension    Hypothyroidism    Migraine headache    Osteopenia    RLS (restless legs syndrome)    SVC syndrome    Thyroid disease     Medications:  Medications Prior to Admission  Medication Sig Dispense Refill Last Dose   albuterol (VENTOLIN HFA) 108 (90 Base) MCG/ACT inhaler Inhale 2 puffs into the lungs every 6 (six) hours as needed for wheezing or shortness of breath. 8 g 2 unknown   Ascorbic Acid (VITAMIN C) 1000 MG tablet Take 1,000 mg by mouth 2 (two) times a day.   10/14/2021   b complex vitamins tablet Take 2 tablets by  mouth daily.   10/14/2021   diltiazem (CARDIZEM CD) 180 MG 24 hr capsule Take 180 mg by mouth daily.   10/14/2021   guaiFENesin (MUCINEX) 600 MG 12 hr tablet Take 1 tablet (600 mg total) by mouth 2 (two) times daily. 30 tablet 0 10/14/2021   hydrALAZINE (APRESOLINE) 50 MG tablet Take 1 tablet (50 mg total) by mouth 3 (three) times daily. 90 tablet 1 10/14/2021   isosorbide mononitrate (IMDUR) 30 MG 24 hr tablet Take 1 tablet (30 mg total) by mouth daily. 30 tablet 1 10/14/2021   levothyroxine (SYNTHROID) 75 MCG tablet Take 75 mcg by mouth daily before breakfast.   10/14/2021   Magnesium Malate 1250 (141.7 Mg) MG TABS Take 1,250 mg by mouth. Two tablets daily.   10/14/2021   Misc Natural Products (YUMVS BEET ROOT-TART CHERRY PO) Take by mouth.   Past Week   potassium chloride (KLOR-CON M) 10 MEQ tablet Take 10 mEq by mouth daily.   10/14/2021   pregabalin (LYRICA) 100 MG capsule TAKE 2 CAPSULES AT BEDTIME. MAY TAKE AN EXTRA ONE IF NEEDED. 90 capsule 3 Past Week   Rotigotine (NEUPRO) 1 MG/24HR PT24 Place 1 patch (1 mg total) onto the skin daily. 90 patch 3 10/14/2021   sodium chloride 1 g tablet Take 1 g by mouth 3 (three) times daily.   10/14/2021   torsemide (DEMADEX) 20 MG tablet Take 20 mg by mouth daily.  10/14/2021   UNABLE TO FIND Take 500 mg by mouth daily. Med Name: Beet Root   10/14/2021   Scheduled:   diltiazem  180 mg Oral Daily   feeding supplement  237 mL Oral BID BM   furosemide  40 mg Intravenous Q12H   guaiFENesin  600 mg Oral BID   isosorbide mononitrate  30 mg Oral Daily   levothyroxine  75 mcg Oral QAC breakfast   nitrofurantoin (macrocrystal-monohydrate)  100 mg Oral Daily   potassium chloride  30 mEq Oral Daily   predniSONE  50 mg Oral Q breakfast   pregabalin  200 mg Oral QHS   Rotigotine  1 mg Transdermal Daily   saccharomyces boulardii  250 mg Oral Daily    Assessment: 76yo female was admitted earlier this month for LOC>fall that caused mild SDH, now CT reveals bilateral  upper lobe PE >> to begin anticoagulation; of note 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  Goal of Therapy:  aPTT 50-70 seconds Monitor platelets by anticoagulation protocol: Yes   Plan:  Argatroban 0.5 mcg/kg/min. Monitor aPTT and CBC.  Wynona Neat, PharmD, BCPS  10/17/2021,5:35 AM

## 2021-10-17 NOTE — Progress Notes (Signed)
ANTICOAGULATION CONSULT NOTE -   Pharmacy Consult for argatroban >> apixaban Indication: pulmonary embolus  Allergies  Allergen Reactions   Iodinated Contrast Media Hives and Rash   Atorvastatin Calcium Other (See Comments)    Musculoskeletal aches Other reaction(s): Other (See Comments) Musculoskeletal aches   Doxycycline Other (See Comments)    Liver problems Other reaction(s): Other (See Comments) Liver problems   Heparin Other (See Comments)    Causes blood clots. Other reaction(s): Other (See Comments) Causes blood clots.   Lisinopril Other (See Comments)    cough Other reaction(s): Other (See Comments) cough   Lovastatin Diarrhea   Ropinirole Hcl Nausea Only    Patient Measurements: Height: '5\' 4"'$  (162.6 cm) Weight: 59.6 kg (131 lb 6.3 oz) IBW/kg (Calculated) : 54.7  Vital Signs: Temp: 98.1 F (36.7 C) (05/26 0318) BP: 122/68 (05/26 0318) Pulse Rate: 80 (05/26 0318)  Labs: Recent Labs    10/15/21 0452 10/16/21 0458 10/17/21 0513  HGB 9.4* 8.0* 9.1*  HCT 28.8* 24.1* 27.3*  PLT 216 163 170  CREATININE 0.95 1.04* 1.02*     Estimated Creatinine Clearance: 41.2 mL/min (A) (by C-G formula based on SCr of 1.02 mg/dL (H)).   Medical History: Past Medical History:  Diagnosis Date   Dysphasia    Endometrial cancer ([place])    Heel spur    Heparin induced thrombocytopenia (HCC)    Hypercholesteremia    Hypertension    Hypothyroidism    Migraine headache    Osteopenia    RLS (restless legs syndrome)    SVC syndrome    Thyroid disease     Medications:  Medications Prior to Admission  Medication Sig Dispense Refill Last Dose   albuterol (VENTOLIN HFA) 108 (90 Base) MCG/ACT inhaler Inhale 2 puffs into the lungs every 6 (six) hours as needed for wheezing or shortness of breath. 8 g 2 unknown   Ascorbic Acid (VITAMIN C) 1000 MG tablet Take 1,000 mg by mouth 2 (two) times a day.   10/14/2021   b complex vitamins tablet Take 2 tablets by mouth daily.    10/14/2021   diltiazem (CARDIZEM CD) 180 MG 24 hr capsule Take 180 mg by mouth daily.   10/14/2021   guaiFENesin (MUCINEX) 600 MG 12 hr tablet Take 1 tablet (600 mg total) by mouth 2 (two) times daily. 30 tablet 0 10/14/2021   hydrALAZINE (APRESOLINE) 50 MG tablet Take 1 tablet (50 mg total) by mouth 3 (three) times daily. 90 tablet 1 10/14/2021   isosorbide mononitrate (IMDUR) 30 MG 24 hr tablet Take 1 tablet (30 mg total) by mouth daily. 30 tablet 1 10/14/2021   levothyroxine (SYNTHROID) 75 MCG tablet Take 75 mcg by mouth daily before breakfast.   10/14/2021   Magnesium Malate 1250 (141.7 Mg) MG TABS Take 1,250 mg by mouth. Two tablets daily.   10/14/2021   Misc Natural Products (YUMVS BEET ROOT-TART CHERRY PO) Take by mouth.   Past Week   potassium chloride (KLOR-CON M) 10 MEQ tablet Take 10 mEq by mouth daily.   10/14/2021   pregabalin (LYRICA) 100 MG capsule TAKE 2 CAPSULES AT BEDTIME. MAY TAKE AN EXTRA ONE IF NEEDED. 90 capsule 3 Past Week   Rotigotine (NEUPRO) 1 MG/24HR PT24 Place 1 patch (1 mg total) onto the skin daily. 90 patch 3 10/14/2021   sodium chloride 1 g tablet Take 1 g by mouth 3 (three) times daily.   10/14/2021   torsemide (DEMADEX) 20 MG tablet Take 20 mg by mouth daily.  10/14/2021   UNABLE TO FIND Take 500 mg by mouth daily. Med Name: Beet Root   10/14/2021   Scheduled:   diltiazem  180 mg Oral Daily   feeding supplement  237 mL Oral BID BM   furosemide  40 mg Intravenous Q12H   guaiFENesin  600 mg Oral BID   isosorbide mononitrate  30 mg Oral Daily   levothyroxine  75 mcg Oral QAC breakfast   nitrofurantoin (macrocrystal-monohydrate)  100 mg Oral Daily   potassium chloride  30 mEq Oral Daily   predniSONE  50 mg Oral Q breakfast   pregabalin  200 mg Oral QHS   Rotigotine  1 mg Transdermal Daily   saccharomyces boulardii  250 mg Oral Daily    Assessment: 76yo female was admitted earlier this month for LOC>fall that caused mild SDH, now CT reveals bilateral upper lobe PE >>  to begin anticoagulation; 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 Hgb 9.1  platelets 170  Goal of Therapy:   Monitor platelets by anticoagulation protocol: Yes   Plan:  Discontinue argatroban. Start apixaban 10 mg twice daily x 7 days followed by apixaban 5 mg twice daily. Monitor H&H and s/s of bleeding.  Margot Ables, PharmD Clinical Pharmacist 10/17/2021 8:50 AM

## 2021-10-17 NOTE — Progress Notes (Signed)
NAME:  [name], MRN:  [id number], DOB:  01-Aug-1945, LOS: 3 ADMISSION DATE:  10/14/2021, CONSULTATION DATE:  10/17/2021  REFERRING MD:  shahmehdi, CHIEF COMPLAINT: Respiratory distress, bilateral pleural effusions  History of Present Illness:  76 year old never smoker admitted with bilateral pleural effusions.  She was admitted 5/6-5/11 with syncope and fall and found to have a small subdural hematoma along the falx.  CT chest showed moderate right and small left pleural effusion with airspace consolidation and air bronchograms in the right middle lobe suspicious for pneumonia.  She underwent right thoracentesis with removal of 1.2 L of fluid, noted to be transudative.  She was treated with Rocephin/azithromycin and discharged on oral antibiotics and 2 L of oxygen.  Over the past few weeks she developed increased leg swelling, PCP prescribed furosemide, she had increased her oxygen to 4 L and was admitted 5/23.  Chest x-ray showed recurrence of bilateral effusions. 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 Complains of shortness of breath No cough Leg swelling is  decreased  Objective   Blood pressure (!) 101/59, pulse 81, temperature 98.3 F (36.8 C), temperature source Oral, resp. rate 18, height '5\' 4"'$  (1.626 m), weight 59.6 kg, SpO2 98 %.        Intake/Output Summary (Last 24 hours) at 10/17/2021 1427 Last data filed at 10/17/2021 0900 Gross per 24 hour  Intake 523.4 ml  Output 350 ml  Net 173.4 ml    Filed Weights   10/15/21 0600 10/16/21 0610 10/17/21 0318  Weight: 69.1 kg 60.6 kg 59.6 kg    Examination: General: Elderly woman sitting up in chair, on 4 L nasal cannula HENT: Mild pallor, no icterus, no JVD Lungs: Decreased breath sounds bilateral, no accessory muscle use Cardiovascular: S1-S2 regular, no murmur, dilated veins over her upper chest Abdomen: Soft, nontender, no hepatosplenomegaly Extremities: No deformity, 1+ edema Neuro: Alert, interactive, nonfocal, no asterixis   Resolved Hospital Problem list     Assessment & Plan:  Bilateral pleural effusions, unclear etiology -RT -Appears transudative on 5/9 but exudative on 5/24 -Left-sided pleural fluid results pending -Cytology on 5/9 was negative for malignancy and showed reactive mesothelial cells -Echo shows normal LV function and BNP is low, she has bipedal edema and albumin is 2.9-3.3 range -She has SVC syndrome and this is the most likely etiology of pleural effusions and shortness of breath  Acute pulmonary embolism -started on argatroban and transition to apixaban  Recommend -We will await pleural fluid results on left and cytology -May need to discuss with vascular if there is any intervention possible, she appears to have collaterals already Main symptom appears to be symptomatic pleural effusions  Best Practice (right click and "Reselect all SmartList Selections" daily)    Code Status:  full code Last date of multidisciplinary goals  of care discussion [NA]  Labs   CBC: Recent Labs  Lab 10/14/21 1845 10/15/21 0452 10/16/21 0458 10/17/21 0513  WBC  10.1 7.0 10.5 10.5  NEUTROABS 8.8*  --   --   --   HGB 10.6* 9.4* 8.0* 9.1*  HCT 31.7* 28.8* 24.1* 27.3*  MCV 94.3 94.4 95.3 95.1  PLT 238 216 163 170     Basic Metabolic Panel: Recent Labs  Lab 10/14/21 1845 10/15/21 0452 10/16/21 0458 10/17/21 0513  NA 138 141 139 138  K 3.5 3.8 2.8* 3.1*  CL 106 108 101 99  CO2 '25 27 30 28  '$ GLUCOSE 107* 94 98 136*  BUN 24* 21 18 26*  CREATININE 1.03* 0.95 1.04* 1.02*  CALCIUM 8.2* 7.9* 7.4* 7.5*  MG  --  1.6*  --  2.2  PHOS  --  2.4*  --   --     GFR: Estimated Creatinine Clearance: 41.2 mL/min (A) (by C-G formula based on SCr of 1.02 mg/dL (H)). Recent Labs  Lab 10/14/21 1845 10/15/21 0452 10/16/21 0458 10/17/21 0513  WBC 10.1 7.0 10.5 10.5     Liver Function Tests: Recent Labs  Lab 10/14/21 1845 10/15/21 0452  AST 27 21  ALT 32 27  ALKPHOS 153* 134*  BILITOT 0.6 0.5  PROT 6.3* 5.5*  ALBUMIN 3.3* 2.9*    No results for input(s): LIPASE, AMYLASE in the last 168 hours. No results for input(s): AMMONIA in the last 168 hours.  ABG    Component Value Date/Time   PHART 7.534 (H) 10/15/2008 0506   PCO2ART 29.9 (L) 10/15/2008 0506   PO2ART 96.0 10/15/2008 0506   HCO3 25.2 (H) 10/15/2008 0506   TCO2 26 10/15/2008 0506   ACIDBASEDEF 1.0 09/29/2008 0336   O2SAT 98.0 10/15/2008 0506      Coagulation Profile: No results for input(s): INR, PROTIME in the last 168 hours.  Cardiac Enzymes: No results for input(s): CKTOTAL, CKMB, CKMBINDEX, TROPONINI in the last 168 hours.  HbA1C: Hgb A1c MFr Bld  Date/Time Value Ref Range Status  12/15/2018 03:20 PM 5.5 4.8 - 5.6 % Final    Comment:             Prediabetes: 5.7 - 6.4          Diabetes: >6.4          Glycemic control for adults with diabetes: <7.0     CBG: No results for input(s): GLUCAP in the last 168 hours.  Kara Mead MD. Shade Flood. [facility] Pager : [phone]  If no response to pager , please call [phone] until 7 pm After 7:00  pm call [place]ink  [phone]   10/17/2021

## 2021-10-17 NOTE — Consult Note (Signed)
Hospital Consult    Reason for Consult: SVC syndrome Referring Physician: Dr. Roger Shelter MRN #:  [id number]  History of Present Illness: This is a 76 y.o. female has a history of indwelling port catheter at the time of cancer.  She actually underwent bilateral upper extremity and central venous thrombectomy in 2010 with interventional radiology.  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  She states that this began occurring about 3 weeks ago and she has had her lung drained on 3 separate occasions.  She is not currently taking any anticoagulants.  She denies any previous lower extremity DVTs.  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  She states she has been very short of breath with minimal activity.  Currently on 4 L nasal cannula not short of breath at rest.  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[facility])    Hypercholesteremia    Hypertension    Hypothyroidism    Migraine headache    Osteopenia    RLS (restless legs syndrome)    SVC syndrome    Thyroid disease     Past Surgical History:  Procedure Laterality Date   ABDOMINAL HYSTERECTOMY     ANKLE SURGERY Right    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     FINGER AMPUTATION     right index finger at knuckle   PORT-A-CATH REMOVAL     TONSILLECTOMY      Allergies  Allergen Reactions   Iodinated Contrast Media Hives and Rash   Atorvastatin Calcium Other (See Comments)    Musculoskeletal aches Other reaction(s): Other (See Comments) Musculoskeletal aches   Doxycycline Other (See Comments)    Liver problems Other reaction(s): Other (See Comments) Liver problems   Heparin Other (See Comments)    Causes blood  clots. Other reaction(s): Other (See Comments) Causes blood clots.   Lisinopril Other (See Comments)    cough Other reaction(s): Other (See Comments) cough   Lovastatin Diarrhea   Ropinirole Hcl Nausea Only    Prior to Admission medications   Medication Sig Start Date End Date Taking? Authorizing Provider  albuterol (VENTOLIN HFA) 108 (90 Base) MCG/ACT inhaler Inhale 2 puffs into the lungs every 6 (six) hours as needed for wheezing or shortness of breath. 10/02/21  Yes Kathie Dike, MD  Ascorbic Acid (VITAMIN C) 1000 MG tablet Take 1,000 mg by mouth 2 (two) times a day.   Yes [provider]  b complex vitamins tablet Take 2 tablets by mouth daily.   Yes [provider]  diltiazem (CARDIZEM CD) 180 MG 24 hr capsule Take 180 mg by mouth daily.   Yes [provider]  guaiFENesin (MUCINEX) 600 MG 12 hr tablet Take 1 tablet (600 mg total) by mouth 2 (two) times daily. 10/02/21  Yes Kathie Dike, MD  hydrALAZINE (APRESOLINE) 50 MG tablet Take 1 tablet (50 mg total) by mouth 3 (three) times daily. 10/02/21  Yes Kathie Dike, MD  isosorbide mononitrate (IMDUR) 30 MG 24 hr tablet Take 1 tablet (30 mg total) by mouth daily. 10/03/21  Yes Kathie Dike, MD  levothyroxine (SYNTHROID) 75 MCG tablet Take 75 mcg by mouth daily before breakfast.   Yes [provider]  Magnesium Malate 1250 (141.7 Mg) MG TABS Take 1,250 mg by mouth. Two tablets daily.   Yes [provider]  Misc Natural Products (YUMVS BEET ROOT-TART CHERRY PO) Take by mouth.   Yes [provider]  potassium chloride (KLOR-CON M) 10 MEQ tablet Take 10 mEq by mouth daily. 10/08/21  Yes [provider]  pregabalin (LYRICA) 100 MG capsule TAKE 2 CAPSULES AT BEDTIME. MAY TAKE AN EXTRA ONE IF NEEDED. 08/13/21  Yes Suzzanne Cloud, NP  Rotigotine (NEUPRO) 1 MG/24HR PT24 Place 1 patch (1 mg total) onto the skin daily. 08/13/21  Yes Suzzanne Cloud, NP  sodium chloride 1 g tablet  Take 1 g by mouth 3 (three) times daily. 08/26/21  Yes [provider]  torsemide (DEMADEX) 20 MG tablet Take 20 mg by mouth daily. 10/08/21  Yes [provider]  UNABLE TO FIND Take 500 mg by mouth daily. Med Name: Beet Root   Yes [provider]    Social History   Socioeconomic History   Marital status: Married    Spouse name: Not on file   Number of children: 3   Years of education: college   Highest education level: Not on file  Occupational History   Occupation: Retired Pharmacist, hospital  Tobacco Use   Smoking status: Never   Smokeless tobacco: Never  Vaping Use   Vaping Use: Never used  Substance and Sexual Activity   Alcohol use: No   Drug use: No   Sexual activity: Not on file  Other Topics Concern   Not on file  Social History Narrative   Lives at home with husband.   Right-handed.   No caffeine use.   Social Determinants of Health   Financial Resource Strain: Not on file  Food Insecurity: Not on file  Transportation Needs: Not on file  Physical Activity: Not on file  Stress: Not on file  Social Connections: Not on file  Intimate Partner Violence: Not on file     Family History  Problem Relation Age of Onset   Stroke Mother    Transient ischemic attack Father    Dementia Father    Pneumonia Father    Diabetes Sister    Rheum arthritis Sister    Brain cancer Brother    Breast cancer Maternal Grandmother    Stroke Paternal Grandmother     Review of Systems  Constitutional: Negative.   HENT: Negative.    Eyes: Negative.   Respiratory:  Positive for shortness of breath.   Cardiovascular:  Positive for leg swelling.  Gastrointestinal: Negative.   Genitourinary: Negative.   Musculoskeletal: Negative.   Skin: Negative.   Neurological: Negative.   Endo/Heme/Allergies: Negative.   Psychiatric/Behavioral: Negative.       Physical Examination  Vitals:   10/17/21 1210 10/17/21 1817  BP: (!) 101/59 126/62  Pulse: 81 81  Resp: 18  19  Temp: 98.3 F (36.8 C) 98.6 F (37 C)  SpO2: 98% 98%   Body mass index is 22.49 kg/m.  Physical Exam HENT:     Head: Normocephalic.  Neck:     Comments: Minimal collateral veins notable Cardiovascular:     Rate and Rhythm: Normal rate.     Pulses:          Radial pulses are 2+ on the right side.       Dorsalis pedis pulses are 2+ on the right side and 2+ on the left side.     Comments: Left arm is amputated  Abdominal:     General: Abdomen is flat.  Musculoskeletal:     Cervical back: Normal range of motion.     Right lower leg: Edema present.     Left lower leg: Edema present.  Skin:    General: Skin is warm and dry.     Capillary Refill: Capillary refill takes less than 2 seconds.  Neurological:     General: No focal deficit present.     Mental Status: She is alert.  Psychiatric:        Mood and Affect: Mood normal.        Behavior: Behavior normal.        Thought Content: Thought content normal.        Judgment: Judgment normal.     CBC    Component Value Date/Time   WBC 10.5 10/17/2021 0513   RBC 2.87 (L) 10/17/2021 0513   HGB 9.1 (L) 10/17/2021 0513   HGB 13.8 12/15/2018 1520   HCT 27.3 (L) 10/17/2021 0513   HCT 40.7 12/15/2018 1520   PLT 170 10/17/2021 0513   PLT 222 12/15/2018 1520   MCV 95.1 10/17/2021 0513   MCV 97 12/15/2018 1520   MCH 31.7 10/17/2021 0513   MCHC 33.3 10/17/2021 0513   RDW 14.1 10/17/2021 0513   RDW 13.1 12/15/2018 1520   LYMPHSABS 0.6 (L) 10/14/2021 1845   LYMPHSABS 1.6 12/15/2018 1520   MONOABS 0.5 10/14/2021 1845   EOSABS 0.1 10/14/2021 1845   EOSABS 0.1 12/15/2018 1520   BASOSABS 0.0 10/14/2021 1845   BASOSABS 0.1 12/15/2018 1520    BMET    Component Value Date/Time   NA 138 10/17/2021 0513   NA 132 (L) 12/22/2018 1436   K 3.1 (L) 10/17/2021 0513   CL 99 10/17/2021 0513   CO2 28 10/17/2021 0513   GLUCOSE 136 (H) 10/17/2021 0513   BUN 26 (H) 10/17/2021 0513   BUN 13 12/22/2018 1436   CREATININE 1.02 (H)  10/17/2021 0513   CALCIUM 7.5 (L) 10/17/2021 0513   GFRNONAA 57 (L) 10/17/2021 0513   GFRAA 91 12/22/2018 1436    COAGS: Lab Results  Component Value Date   INR 3.8 (H) 11/10/2008   INR 3.0 (H) 11/09/2008   INR 2.7 (H) 11/08/2008     Non-Invasive Vascular Imaging:   CTA Addendum is made for comment on patency of the superior vena cava; as originally reported there is evidently complete transit of the right upper extremity bolus via chest wall and azygous collaterals, with no opacification of the subclavian or more central vessels in the right upper extremity, and with a very narrowed, effaced appearance of the superior vena cava (series 5, image 129). 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  ASSESSMENT/PLAN: This is a 76 y.o. female here with recurrent pleural effusions with evidence of SVC syndrome.  Plan will be for venography from the right upper extremity access and probable right common femoral vein access.  I discussed with her that this may have been present for many months or even years and we may not be able to treat this I do not think she would be a very good candidate for an open SVC repair with cardiothoracic surgery at this time and she demonstrates good understanding of this.  She is currently on Eliquis and this will need to be held and we can plan for procedure in the coming days.  Elizibeth Breau C. Donzetta Matters, MD  [facility] Office: [phone] Pager: [phone]

## 2021-10-17 NOTE — Consult Note (Signed)
[facility] Inpatient Consult   10/17/2021  [name] February 16, 1946 [id number]  [name]   Patient was reviewed for less than 30 days readmission and noted high risk score for unplanned readmission. Assessed for potential post hospital chronic care coordination needs.  Plan: Will continue to follow for progression and disposition plans.  Of note, [facility] Care Management services does not replace or interfere with any services that are arranged by inpatient case management or social work.   [name], MSN, RN [facility] Liaison Toll free office [phone]

## 2021-10-17 NOTE — Progress Notes (Signed)
Patient transferred to [facility] care link Report given to [facility] with care link

## 2021-10-17 NOTE — Progress Notes (Signed)
  Echocardiogram 2D Echocardiogram has been performed.  [name] 10/17/2021, 2:12 PM

## 2021-10-18 DIAGNOSIS — I2699 Other pulmonary embolism without acute cor pulmonale: Secondary | ICD-10-CM | POA: Diagnosis not present

## 2021-10-18 DIAGNOSIS — J90 Pleural effusion, not elsewhere classified: Secondary | ICD-10-CM | POA: Diagnosis not present

## 2021-10-18 DIAGNOSIS — E039 Hypothyroidism, unspecified: Secondary | ICD-10-CM | POA: Diagnosis not present

## 2021-10-18 DIAGNOSIS — J9601 Acute respiratory failure with hypoxia: Secondary | ICD-10-CM | POA: Diagnosis not present

## 2021-10-18 LAB — CBC
HCT: 26.6 % — ABNORMAL LOW (ref 36.0–46.0)
Hemoglobin: 8.8 g/dL — ABNORMAL LOW (ref 12.0–15.0)
MCH: 31.3 pg (ref 26.0–34.0)
MCHC: 33.1 g/dL (ref 30.0–36.0)
MCV: 94.7 fL (ref 80.0–100.0)
Platelets: 212 10*3/uL (ref 150–400)
RBC: 2.81 MIL/uL — ABNORMAL LOW (ref 3.87–5.11)
RDW: 14.2 % (ref 11.5–15.5)
WBC: 13.5 10*3/uL — ABNORMAL HIGH (ref 4.0–10.5)
nRBC: 0 % (ref 0.0–0.2)

## 2021-10-18 LAB — BASIC METABOLIC PANEL
Anion gap: 10 (ref 5–15)
BUN: 42 mg/dL — ABNORMAL HIGH (ref 8–23)
CO2: 28 mmol/L (ref 22–32)
Calcium: 7.6 mg/dL — ABNORMAL LOW (ref 8.9–10.3)
Chloride: 99 mmol/L (ref 98–111)
Creatinine, Ser: 1.32 mg/dL — ABNORMAL HIGH (ref 0.44–1.00)
GFR, Estimated: 42 mL/min — ABNORMAL LOW (ref >60)
Glucose, Bld: 145 mg/dL — ABNORMAL HIGH (ref 70–99)
Potassium: 3.3 mmol/L — ABNORMAL LOW (ref 3.5–5.1)
Sodium: 137 mmol/L (ref 135–145)

## 2021-10-18 LAB — APTT
aPTT: 58 seconds — ABNORMAL HIGH (ref 24–36)
aPTT: 57 seconds — ABNORMAL HIGH (ref 24–36)

## 2021-10-18 MED ORDER — TORSEMIDE 20 MG PO TABS
20.0000 mg | ORAL_TABLET | Freq: Every day | ORAL | Status: DC
Start: 2021-10-19 — End: 2021-10-20
  Administered 2021-10-20: 20 mg via ORAL
  Filled 2021-10-18: qty 1

## 2021-10-18 MED ORDER — POTASSIUM CHLORIDE CRYS ER 20 MEQ PO TBCR
40.0000 meq | EXTENDED_RELEASE_TABLET | Freq: Every day | ORAL | Status: DC
  Administered 2021-10-19: 40 meq via ORAL
  Filled 2021-10-18: qty 2

## 2021-10-18 MED ORDER — ARGATROBAN 50 MG/50ML IV SOLN
0.5000 ug/kg/min | INTRAVENOUS | Status: AC
  Administered 2021-10-18 – 2021-10-19 (×3): 0.5 ug/kg/min via INTRAVENOUS
  Filled 2021-10-18 (×3): qty 50

## 2021-10-18 NOTE — Progress Notes (Signed)
Progress Note  Patient: [name] IZT:[id number] DOB: 1946/04/08  DOA: 10/14/2021  DOS: 10/18/2021    Brief hospital course: [name] is a 76 y.o. 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  Patient was admitted from 5/6 through 5/11 due to community acquired pneumonia with acute hypoxic respiratory failure and pleural effusion which required right-sided thoracentesis on 09/30/2021 with removal of 1.2 L of pleural fluid noted to be transudative in nature (considered to be parapneumonic in the setting of underlying infection), during hospitalization she was treated with IV Rocephin/azithromycin mucolytic's and bronchodilators and this was transitioned to oral antibiotics prior to discharge.  She also had syncope on that admission with echo revealing G1 DD. Patient now complains of worsening shortness of breath and increased work of breathing, she was discharged with supplemental oxygen at 2 LPM via [place], but patient has gradually increased this to 4 LPM.  She also complaining of increased leg swelling.  She had a follow-up with her PCP who prescribed furosemide, but there was some confusion regarding the dosing, so there was delay in patient starting the medication which was just started today.  She was seen by home health normal who asked her to go to the ED for further evaluation and management.   ED Course:  She was found tachypneic, BP 153/89, O2 sat was 94-96% on 4 L supplemental oxygen .Work-up in the ED showed normocytic anemia, BMP was normal except for BUN of 24, creatinine was 1.03 (this ranged within 1.5-1.7 in the last 2 weeks). Chest x-ray (left decubitus) showed free-flowing left  pleural effusion. Primarily free-flowing right pleural effusion. Portable chest x-ray showed moderate right-greater-than-left pleural effusions, increased in size when compared with prior exam.   Hospitalist was asked to admit patient for further evaluation and management.  Assessment and Plan: Acute hypoxic respiratory failure: Due to pleural effusions, with bilateral PE's and GGOs (possible PNA) contributing.  - Wean oxygen as able, down to 4L and stable.  - If cough or fever develops or pleural culture becomes positive, would initiate abx.   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 Evidence of some collateralization.  - Vascular surgery consulted. Discussed with Dr. Donzetta Matters today who will take patient to OR 5/28 for venography, possible intervention. Continuing argatroban ok for now. NPO p MN. Note history of reaction to iodinated contrast for which she's premedicated with prednisone (suspected cause of leukocytosis without fever)  R > L recurrent, symptomatic pleural effusions: ?if secondary to SVC syndrome.   - s/p R thoracentesis 1.2 L, transudate, with negative cytology during prior admission. Repeated 5/24, 1.4L clear yellow with protein level suggestive technically of exudate. Cytology without malignancy identified. - s/p L thoracentesis this admission 5/25 800cc fluid discarded, no labs.    Bilateral pulmonary embolism: No CT evidence of R heart strain. RV normal size/function by echo.  - Continue anticoagulation with argatroban for now, convert back to DOAC once cleared by vascular surgery.    History of fall with SDH: Most recent CT shows resolution of SDH.  - Approaching anticoagulation cautiously.    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  - Argatroban as above. Avoid heparin and derivatives. Tolerates DOAC.  - CBC daily.  Acute on chronic HFpEF: with LVEF 72%, G1DD. BNP is low. Less likely to be primary cause  of effusions, though with hypoalbuminemia, could be contributing.   - lasix '40mg'$  IV given earlier in hospitalization, will transition back to home po torsemide '20mg'$  daily. Recheck BMP in AM as Cr is up today.  Hypoalbuminemia, mild protein calorie malnutrition - Supplement protein in diet as much as reasonable    Hypokalemia:  - Augment supplementation.  Hypothyroidism: Recent TSH 2.416.  - Continue synthroid   HTN:  - Continue imdur, diltiazem  Prolonged QT interval:  - Avoid provocative agents.  - Keep K, Mg replete.  - Remain on telemetry.   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    Subjective: Shortness of breath feels improved. No cough or bleeding reported. No chest pain. Eager to pursue venogram tomorrow.   Objective: Vitals:   10/18/21 0533 10/18/21 0900 10/18/21 1056 10/18/21 1532  BP: (!) 112/59 133/70 122/72 119/73  Pulse: 83 70 71 65  Resp: '20 16 17 17  '$ Temp: 97.9 F (36.6 C) 97.8 F (36.6 C) 97.9 F (36.6 C) 97.7 F (36.5 C)  TempSrc: Oral Oral Oral Oral  SpO2: 94% 97% 95% 100%  Weight: 59.9 kg     Height:       Gen: 76 y.o. female in no distress Pulm: Nonlabored breathing 4L O2, bronchial sounds throughout, diminished on L > R without wheezes or crackles. CV: Regular rate and rhythm. No murmur, rub, or gallop. No JVD, 1+ pitting dependent edema. GI: Abdomen soft, non-tender, non-distended, with normoactive bowel sounds.  Ext: Warm, LUE amputation Skin: No acute rashes, lesions or ulcers on visualized skin. Neuro: Alert and oriented. No focal neurological deficits. Psych: Judgement and insight appear fair. Mood euthymic & affect congruent. Behavior is appropriate.    Data Personally reviewed:  CBC: Recent Labs  Lab 10/14/21 1845 10/15/21 0452 10/16/21 0458 10/17/21 0513 10/18/21 0047  WBC 10.1 7.0 10.5 10.5 13.5*  NEUTROABS 8.8*  --   --   --   --   HGB 10.6* 9.4* 8.0* 9.1* 8.8*  HCT 31.7* 28.8* 24.1* 27.3*  26.6*  MCV 94.3 94.4 95.3 95.1 94.7  PLT 238 216 163 170 657   Basic Metabolic Panel: Recent Labs  Lab 10/14/21 1845 10/15/21 0452 10/16/21 0458 10/17/21 0513 10/18/21 0047  NA 138 141 139 138 137  K 3.5 3.8 2.8* 3.1* 3.3*  CL 106 108 101 99 99  CO2 '25 27 30 28 28  '$ GLUCOSE 107* 94 98 136* 145*  BUN 24* 21 18 26* 42*  CREATININE 1.03* 0.95 1.04* 1.02* 1.32*  CALCIUM 8.2* 7.9* 7.4* 7.5* 7.6*  MG  --  1.6*  --  2.2  --   PHOS  --  2.4*  --   --   --    GFR: Estimated Creatinine Clearance: 31.8 mL/min (A) (by C-G formula based on SCr of 1.32 mg/dL (H)). Liver Function Tests: Recent Labs  Lab 10/14/21 1845 10/15/21 0452  AST 27 21  ALT 32 27  ALKPHOS 153* 134*  BILITOT 0.6 0.5  PROT 6.3* 5.5*  ALBUMIN 3.3* 2.9*   No results for input(s): LIPASE, AMYLASE in the last 168 hours. No results for input(s): AMMONIA in the last 168 hours. Coagulation Profile: No results for input(s): INR, PROTIME in the last 168 hours. Cardiac Enzymes: No results for input(s): CKTOTAL, CKMB, CKMBINDEX, TROPONINI in the last 168 hours. BNP (last 3 results) No results for  input(s): PROBNP in the last 8760 hours. HbA1C: No results for input(s): HGBA1C in the last 72 hours. CBG: No results for input(s): GLUCAP in the last 168 hours. Lipid Profile: No results for input(s): CHOL, HDL, LDLCALC, TRIG, CHOLHDL, LDLDIRECT in the last 72 hours. Thyroid Function Tests: No results for input(s): TSH, T4TOTAL, FREET4, T3FREE, THYROIDAB in the last 72 hours. Anemia Panel: No results for input(s): VITAMINB12, FOLATE, FERRITIN, TIBC, IRON, RETICCTPCT in the last 72 hours. Urine analysis:    Component Value Date/Time   COLORURINE YELLOW 09/27/2021 1714   APPEARANCEUR CLEAR 09/27/2021 1714   LABSPEC 1.010 09/27/2021 1714   PHURINE 7.0 09/27/2021 [place] 09/27/2021 1714   HGBUR NEGATIVE 09/27/2021 1714   BILIRUBINUR NEGATIVE 09/27/2021 1714   KETONESUR NEGATIVE 09/27/2021 1714    PROTEINUR NEGATIVE 09/27/2021 1714   UROBILINOGEN 0.2 11/08/2008 1412   NITRITE NEGATIVE 09/27/2021 1714   LEUKOCYTESUR TRACE (A) 09/27/2021 1714   Recent Results (from the past 240 hour(s))  Culture, body fluid w Gram Stain-bottle     Status: None (Preliminary result)   Collection Time: 10/15/21 10:45 AM   Specimen: Pleura  Result Value Ref Range Status   Specimen Description PLEURAL  Final   Special Requests   Final    BOTTLES DRAWN AEROBIC AND ANAEROBIC Blood Culture adequate volume   Culture   Final    NO GROWTH 3 DAYS Performed at [facility], [address], [place], [place] [address]    Report Status PENDING  Incomplete  Gram stain     Status: None   Collection Time: 10/15/21 10:45 AM   Specimen: Pleura  Result Value Ref Range Status   Specimen Description PLEURAL  Final   Special Requests NONE  Final   Gram Stain   Final    CYTOSPIN SMEAR NO ORGANISMS SEEN WBC PRESENT,BOTH PMN AND MONONUCLEAR Performed at [facility], [address], [place], [place] [address]    Report Status 10/15/2021 FINAL  Final  MRSA Next Gen by PCR, Nasal     Status: None   Collection Time: 10/17/21  6:18 PM   Specimen: Nasal Mucosa; Nasal Swab  Result Value Ref Range Status   MRSA by PCR Next Gen NOT DETECTED NOT DETECTED Final    Comment: (NOTE) The GeneXpert MRSA Assay (FDA approved for NASAL specimens only), is one component of a comprehensive MRSA colonization surveillance program. It is not intended to diagnose MRSA infection nor to guide or monitor treatment for MRSA infections. Test performance is not FDA approved in patients less than 82 years old. Performed at [facility], [place] [address] [place], [place] [address]      CT HEAD WO CONTRAST (5MM)  Result Date: 10/17/2021 CLINICAL DATA:  Subdural hematoma. EXAM: CT HEAD WITHOUT CONTRAST TECHNIQUE: Contiguous axial images were obtained from the base of the skull through the vertex without intravenous contrast. RADIATION DOSE  REDUCTION: This exam was performed according to the departmental dose-optimization program which includes automated exposure control, adjustment of the mA and/or kV according to patient size and/or use of iterative reconstruction technique. COMPARISON:  Sep 28, 2021. FINDINGS: Brain: No evidence of acute infarction, hemorrhage, hydrocephalus, extra-axial collection or mass lesion/mass effect. Vascular: No hyperdense vessel or unexpected calcification. Skull: Normal. Negative for fracture or focal lesion. Sinuses/Orbits: No acute finding. Other: None. IMPRESSION: No acute intracranial abnormality seen. Right para falcine subdural hematoma noted on prior exam has resolved. Electronically Signed   By: Bobbe Medico.D.  On: 10/17/2021 14:16   CT Angio Chest Pulmonary Embolism (PE) W or WO Contrast  Addendum Date: 10/17/2021   ADDENDUM REPORT: 10/17/2021 13:16 ADDENDUM: Addendum is made for comment on patency of the superior vena cava; as originally reported there is evidently complete transit of the right upper extremity bolus via chest wall and azygous collaterals, with no opacification of the subclavian or more central vessels in the right upper extremity, and with a very narrowed, effaced appearance of the superior vena cava (series 5, image 129). 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 Additionally, comment is made on pulmonary embolus identified and reported; relatively small burden of embolus is present in segmental to subsegmental vessels of the upper lobes and possibly within distal subsegmental vessels in the lower lobes. There is no more central embolus identified. Findings discussed by telephone with Dr. Elsworth Soho. Electronically Signed   By: Delanna Ahmadi M.D.   On: 10/17/2021 13:16   Result Date: 10/17/2021 CLINICAL DATA:  Pleural effusion, known or suspected (Ped 0-17y). Bilateral pleural effusions, shortness  of breath. Question pulmonary embolus. EXAM: CT ANGIOGRAPHY CHEST WITH CONTRAST TECHNIQUE: Multidetector CT imaging of the chest was performed using the standard protocol during bolus administration of intravenous contrast. Multiplanar CT image reconstructions and MIPs were obtained to evaluate the vascular anatomy. RADIATION DOSE REDUCTION: This exam was performed according to the departmental dose-optimization program which includes automated exposure control, adjustment of the mA and/or kV according to patient size and/or use of iterative reconstruction technique. CONTRAST:  145m OMNIPAQUE IOHEXOL 350 MG/ML SOLN. Patient received 13 hour premedication for contrast allergy. No immediate complications. COMPARISON:  09/27/2021 FINDINGS: Cardiovascular: There are bilateral pulmonary emboli in the upper lobes bilaterally. No evidence of right heart strain. Heart is mildly enlarged. Aorta normal caliber. Scattered coronary artery and aortic calcifications. Extensive collateral venous channels in the right chest wall with non opacification of the right subclavian vein, likely related to chronic stenosis or occlusion. Mediastinum/Nodes: No mediastinal, hilar, or axillary adenopathy. Trachea and esophagus are unremarkable. Thyroid unremarkable. Lungs/Pleura: Moderate to large bilateral pleural effusions, right greater than left, stable since prior study. Compressive atelectasis in the lower lobes. Areas of consolidation in the inferior and posterior right upper lobe concerning for pneumonia. Patchy bilateral upper lobe ground-glass opacities, likely pneumonia. Upper Abdomen: No acute findings Musculoskeletal: No acute bony abnormality. Review of the MIP images confirms the above findings. IMPRESSION: Bilateral upper lobe pulmonary emboli. No evidence of right heart strain. Moderate to large bilateral pleural effusions, right greater than left with compressive atelectasis in the lower lobes, stable since prior study.  Consolidation in the inferior and posterior right upper lobe with areas of patchy ground-glass opacities in both upper lobes. Findings most compatible with pneumonia. Mild cardiomegaly. Aortic Atherosclerosis (ICD10-I70.0). These results will be called to the ordering clinician or representative by the Radiologist Assistant, and communication documented in the PACS or [employer] Electronically Signed: By: KRolm BaptiseM.D. On: 10/17/2021 03:21   UKoreaVenous Img Lower Bilateral (DVT)  Result Date: 10/17/2021 CLINICAL DATA:  Pulmonary embolism. EXAM: BILATERAL LOWER EXTREMITY VENOUS DOPPLER ULTRASOUND TECHNIQUE: Gray-scale sonography with graded compression, as well as color Doppler and duplex ultrasound were performed to evaluate the lower extremity deep venous systems from the level of the common femoral vein and including the common femoral, femoral, profunda femoral, popliteal and calf veins including the posterior tibial, peroneal and gastrocnemius veins when visible. The superficial great  saphenous vein was also interrogated. Spectral Doppler was utilized to evaluate flow at rest and with distal augmentation maneuvers in the common femoral, femoral and popliteal veins. COMPARISON:  None Available. FINDINGS: RIGHT LOWER EXTREMITY Common Femoral Vein: No evidence of thrombus. Normal compressibility, respiratory phasicity and response to augmentation. Saphenofemoral Junction: No evidence of thrombus. Normal compressibility and flow on color Doppler imaging. Profunda Femoral Vein: No evidence of thrombus. Normal compressibility and flow on color Doppler imaging. Femoral Vein: Proximal and mid segments are patent. There is thrombus in the distal thigh segment of the right femoral vein. Popliteal Vein: No evidence of thrombus. Normal compressibility, respiratory phasicity and response to augmentation. Calf Veins: No evidence of thrombus. Normal compressibility and flow on color Doppler imaging. Superficial  Great Saphenous Vein: No evidence of thrombus. Normal compressibility. Venous Reflux:  None. Other Findings: Thrombus also identified in a right gastrocnemius vein. No evidence of superficial thrombophlebitis or abnormal fluid collection. LEFT LOWER EXTREMITY Common Femoral Vein: No evidence of thrombus. Normal compressibility, respiratory phasicity and response to augmentation. Saphenofemoral Junction: No evidence of thrombus. Normal compressibility and flow on color Doppler imaging. Profunda Femoral Vein: No evidence of thrombus. Normal compressibility and flow on color Doppler imaging. Femoral Vein: No evidence of thrombus. Normal compressibility, respiratory phasicity and response to augmentation. Popliteal Vein: No evidence of thrombus. Normal compressibility, respiratory phasicity and response to augmentation. Calf Veins: Thrombus visualized in left posterior tibial and peroneal veins which appears occlusive. Superficial Great Saphenous Vein: No evidence of thrombus. Normal compressibility. Venous Reflux:  None. Other Findings: No evidence of superficial thrombophlebitis or abnormal fluid collection. IMPRESSION: Deep vein thrombus identified in the distal right femoral vein, right gastrocnemius vein and left posterior tibial and peroneal veins. Electronically Signed   By: Aletta Edouard M.D.   On: 10/17/2021 15:06   ECHOCARDIOGRAM COMPLETE  Result Date: 10/17/2021    ECHOCARDIOGRAM REPORT   Patient Name:   KRISTELL WOODING Date of Exam: 10/17/2021 Medical Rec #:  [id number]     Height:       64.0 in Accession #:    [id number]    Weight:       131.4 lb Date of Birth:  1946/05/08     BSA:          1.636 m Patient Age:    35 years      BP:           118/92 mmHg Patient Gender: F             HR:           76 bpm. Exam Location:  [facility] Procedure: 2D Echo, Cardiac Doppler and Color Doppler Indications:    Pulmonary Embolus I26.09  History:        Patient has prior history of Echocardiogram examinations, most                  recent 10/15/2021. Risk Factors:Hypertension.  Sonographer:    Bernadene Person RDCS Referring Phys: [id number] ASIA B [place]  1. Left ventricular ejection fraction, by estimation, is 55 to 60%. Left ventricular ejection fraction by 2D MOD biplane is 57.4 %. The left ventricle has normal function. The left ventricle has no regional wall motion abnormalities. Left ventricular diastolic parameters are consistent with Grade I diastolic dysfunction (impaired relaxation).  2. Right ventricular systolic function is normal. The right ventricular size is normal. There is normal pulmonary artery systolic pressure. The estimated right ventricular systolic pressure is 18.8  mmHg.  3. The pericardial effusion is circumferential. There is no evidence of cardiac tamponade.  4. The mitral valve is grossly normal. Trivial mitral valve regurgitation.  5. The aortic valve is tricuspid. Aortic valve regurgitation is not visualized.  6. The inferior vena cava is normal in size with greater than 50% respiratory variability, suggesting right atrial pressure of 3 mmHg. Comparison(s): Changes from prior study are noted. 10/15/2021: LVEF 70-75%, normal RV function, small pericardial effusion anterior to the RV. FINDINGS  Left Ventricle: Left ventricular ejection fraction, by estimation, is 55 to 60%. Left ventricular ejection fraction by 2D MOD biplane is 57.4 %. The left ventricle has normal function. The left ventricle has no regional wall motion abnormalities. The left ventricular internal cavity size was normal in size. There is no left ventricular hypertrophy. Left ventricular diastolic parameters are consistent with Grade I diastolic dysfunction (impaired relaxation). Indeterminate filling pressures. Right Ventricle: The right ventricular size is normal. No increase in right ventricular wall thickness. Right ventricular systolic function is normal. There is normal pulmonary artery systolic pressure. The  tricuspid regurgitant velocity is 1.99 m/s, and  with an assumed right atrial pressure of 3 mmHg, the estimated right ventricular systolic pressure is 78.9 mmHg. Left Atrium: Left atrial size was normal in size. Right Atrium: Right atrial size was normal in size. Pericardium: Trivial pericardial effusion is present. The pericardial effusion is circumferential. There is no evidence of cardiac tamponade. Mitral Valve: The mitral valve is grossly normal. Trivial mitral valve regurgitation. Tricuspid Valve: The tricuspid valve is grossly normal. Tricuspid valve regurgitation is trivial. Aortic Valve: The aortic valve is tricuspid. Aortic valve regurgitation is not visualized. Pulmonic Valve: The pulmonic valve was normal in structure. Pulmonic valve regurgitation is not visualized. Aorta: The aortic root and ascending aorta are structurally normal, with no evidence of dilitation. Venous: The inferior vena cava is normal in size with greater than 50% respiratory variability, suggesting right atrial pressure of 3 mmHg. IAS/Shunts: No atrial level shunt detected by color flow Doppler.  LEFT VENTRICLE PLAX 2D                        Biplane EF (MOD) LVIDd:         3.70 cm         LV Biplane EF:   Left LVIDs:         2.50 cm                          ventricular LV PW:         0.90 cm                          ejection LV IVS:        0.70 cm                          fraction by LVOT diam:     2.00 cm                          2D MOD LV SV:         58                               biplane is LV SV Index:   35  57.4 %. LVOT Area:     3.14 cm                                Diastology                                LV e' medial:    6.49 cm/s LV Volumes (MOD)               LV E/e' medial:  10.2 LV vol d, MOD    68.3 ml       LV e' lateral:   9.08 cm/s A2C:                           LV E/e' lateral: 7.3 LV vol d, MOD    64.2 ml A4C: LV vol s, MOD    30.9 ml A2C: LV vol s, MOD    26.0 ml A4C: LV SV MOD  A2C:   37.4 ml LV SV MOD A4C:   64.2 ml LV SV MOD BP:    38.3 ml RIGHT VENTRICLE RV S prime:     10.20 cm/s TAPSE (M-mode): 1.7 cm LEFT ATRIUM             Index        RIGHT ATRIUM           Index LA diam:        3.00 cm 1.83 cm/m   RA Area:     10.10 cm LA Vol (A2C):   23.9 ml 14.60 ml/m  RA Volume:   21.00 ml  12.83 ml/m LA Vol (A4C):   23.8 ml 14.54 ml/m LA Biplane Vol: 24.6 ml 15.03 ml/m  AORTIC VALVE LVOT Vmax:   96.00 cm/s LVOT Vmean:  60.800 cm/s LVOT VTI:    0.184 m  AORTA Ao Root diam: 3.30 cm Ao Asc diam:  3.30 cm MITRAL VALVE               TRICUSPID VALVE MV Area (PHT): 2.79 cm    TR Peak grad:   15.8 mmHg MV Decel Time: 272 msec    TR Vmax:        199.00 cm/s MV E velocity: 66.40 cm/s MV A velocity: 84.80 cm/s  SHUNTS MV E/A ratio:  0.78        Systemic VTI:  0.18 m                            Systemic Diam: 2.00 cm Lyman Bishop MD Electronically signed by Lyman Bishop MD Signature Date/Time: 10/17/2021/4:37:57 PM    Final     Family Communication: Husband at bedside  Disposition: Status is: Inpatient Remains inpatient appropriate because: Hypoxic respiratory failure, SVC syndrome Planned Discharge Destination:  TBD    Patrecia Pour, MD 10/18/2021 6:40 PM Page by [email or web address]

## 2021-10-18 NOTE — Progress Notes (Signed)
[place] for argatroban Indication: pulmonary embolus  Allergies  Allergen Reactions   Iodinated Contrast Media Hives and Rash   Atorvastatin Calcium Other (See Comments)    Musculoskeletal aches Other reaction(s): Other (See Comments) Musculoskeletal aches   Doxycycline Other (See Comments)    Liver problems Other reaction(s): Other (See Comments) Liver problems   Heparin Other (See Comments)    Causes blood clots. Other reaction(s): Other (See Comments) Causes blood clots.   Lisinopril Other (See Comments)    cough Other reaction(s): Other (See Comments) cough   Lovastatin Diarrhea   Ropinirole Hcl Nausea Only    Patient Measurements: Height: '5\' 4"'$  (162.6 cm) Weight: 59.9 kg (132 lb 0.9 oz) IBW/kg (Calculated) : 54.7  Vital Signs: Temp: 97.9 F (36.6 C) (05/27 1056) Temp Source: Oral (05/27 1056) BP: 122/72 (05/27 1056) Pulse Rate: 71 (05/27 1056)  Labs: Recent Labs    10/16/21 0458 10/17/21 0513 10/18/21 0047 10/18/21 1131  HGB 8.0* 9.1* 8.8*  --   HCT 24.1* 27.3* 26.6*  --   PLT 163 170 212  --   APTT  --   --   --  58*  CREATININE 1.04* 1.02* 1.32*  --      Estimated Creatinine Clearance: 31.8 mL/min (A) (by C-G formula based on SCr of 1.32 mg/dL (H)).   Medical History: Past Medical History:  Diagnosis Date   Dysphasia    Endometrial cancer ([place])    Heel spur    Heparin induced thrombocytopenia (HCC)    Hypercholesteremia    Hypertension    Hypothyroidism    Migraine headache    Osteopenia    RLS (restless legs syndrome)    SVC syndrome    Thyroid disease     Medications:   argatroban 0.5 mcg/kg/min (10/18/21 0551)    Assessment: 76yo female was admitted earlier this month for LOC>fall that caused mild SDH, CT reveals bilateral upper lobe PE >> started on anticoagulation with argatroban (confirmed h/o HIT) switched to Eliquis, now tx'd to [facility] for vascular consult/procedure >> to hold Eliquis  and resume argatroban; will dose conservatively given recent SDH (SDH resolved on CT).  Initial ptt on argatroban at goal at 37.  No overt bleeding or complications noted.  Goal of Therapy:  aPTT 50-70 seconds Monitor platelets by anticoagulation protocol: Yes   Plan:  Continue argatroban 0.5 mcg/kg/min. Recheck aPTT in 4 hrs. Monitor aPTT and CBC.   [place] Crane, Vena [place], BCPS, BCCP Clinical Pharmacist  10/18/2021 1:25 PM   [facility] pharmacy phone numbers are listed on [email or web address]

## 2021-10-18 NOTE — Progress Notes (Addendum)
  Progress Note   Addendum:  I discussed the timing with the operating room and with the patient and now we will plan for central venography from a right upper extremity and right common femoral approach tomorrow in the operating room.  Devondre Guzzetta C. Donzetta Matters, MD  10/18/2021 10:09 AM   Subjective:  no overnight issues  Vitals:   10/18/21 0533 10/18/21 0900  BP: (!) 112/59 133/70  Pulse: 83 70  Resp: 20 16  Temp: 97.9 F (36.6 C) 97.8 F (36.6 C)  SpO2: 94% 97%    Physical Exam: Aaox3 Breathing is nonlabored with nasal cannula No upper extremity or facial swelling   CBC    Component Value Date/Time   WBC 13.5 (H) 10/18/2021 0047   RBC 2.81 (L) 10/18/2021 0047   HGB 8.8 (L) 10/18/2021 0047   HGB 13.8 12/15/2018 1520   HCT 26.6 (L) 10/18/2021 0047   HCT 40.7 12/15/2018 1520   PLT 212 10/18/2021 0047   PLT 222 12/15/2018 1520   MCV 94.7 10/18/2021 0047   MCV 97 12/15/2018 1520   MCH 31.3 10/18/2021 0047   MCHC 33.1 10/18/2021 0047   RDW 14.2 10/18/2021 0047   RDW 13.1 12/15/2018 1520   LYMPHSABS 0.6 (L) 10/14/2021 1845   LYMPHSABS 1.6 12/15/2018 1520   MONOABS 0.5 10/14/2021 1845   EOSABS 0.1 10/14/2021 1845   EOSABS 0.1 12/15/2018 1520   BASOSABS 0.0 10/14/2021 1845   BASOSABS 0.1 12/15/2018 1520    BMET    Component Value Date/Time   NA 137 10/18/2021 0047   NA 132 (L) 12/22/2018 1436   K 3.3 (L) 10/18/2021 0047   CL 99 10/18/2021 0047   CO2 28 10/18/2021 0047   GLUCOSE 145 (H) 10/18/2021 0047   BUN 42 (H) 10/18/2021 0047   BUN 13 12/22/2018 1436   CREATININE 1.32 (H) 10/18/2021 0047   CALCIUM 7.6 (L) 10/18/2021 0047   GFRNONAA 42 (L) 10/18/2021 0047   GFRAA 91 12/22/2018 1436    INR    Component Value Date/Time   INR 3.8 (H) 11/10/2008 0650     Intake/Output Summary (Last 24 hours) at 10/18/2021 1009 Last data filed at 10/18/2021 0900 Gross per 24 hour  Intake 300 ml  Output --  Net 300 ml     Assessment:  76 y.o. female is here with  recurrent pleural effusions with notable SVC syndrome.  Plan: Continue agatroban, we will plan for central venography with possible treatment of SVC syndrome on Monday in the OR.  Maripaz Mullan C. Donzetta Matters, MD [facility] Office: [phone] Pager: [phone]  10/18/2021 10:09 AM

## 2021-10-18 NOTE — Progress Notes (Signed)
[place] for argatroban Indication: pulmonary embolus  Allergies  Allergen Reactions   Iodinated Contrast Media Hives and Rash   Atorvastatin Calcium Other (See Comments)    Musculoskeletal aches Other reaction(s): Other (See Comments) Musculoskeletal aches   Doxycycline Other (See Comments)    Liver problems Other reaction(s): Other (See Comments) Liver problems   Heparin Other (See Comments)    Causes blood clots. Other reaction(s): Other (See Comments) Causes blood clots.   Lisinopril Other (See Comments)    cough Other reaction(s): Other (See Comments) cough   Lovastatin Diarrhea   Ropinirole Hcl Nausea Only    Patient Measurements: Height: '5\' 4"'$  (162.6 cm) Weight: 59.9 kg (132 lb 0.9 oz) IBW/kg (Calculated) : 54.7  Vital Signs: Temp: 97.7 F (36.5 C) (05/27 1532) Temp Source: Oral (05/27 1532) BP: 119/73 (05/27 1532) Pulse Rate: 65 (05/27 1532)  Labs: Recent Labs    10/16/21 0458 10/17/21 0513 10/18/21 0047 10/18/21 1131 10/18/21 1630  HGB 8.0* 9.1* 8.8*  --   --   HCT 24.1* 27.3* 26.6*  --   --   PLT 163 170 212  --   --   APTT  --   --   --  58* 57*  CREATININE 1.04* 1.02* 1.32*  --   --      Estimated Creatinine Clearance: 31.8 mL/min (A) (by C-G formula based on SCr of 1.32 mg/dL (H)).   Medical History: Past Medical History:  Diagnosis Date   Dysphasia    Endometrial cancer ([place])    Heel spur    Heparin induced thrombocytopenia (HCC)    Hypercholesteremia    Hypertension    Hypothyroidism    Migraine headache    Osteopenia    RLS (restless legs syndrome)    SVC syndrome    Thyroid disease     Medications:   argatroban 0.5 mcg/kg/min (10/18/21 0551)    Assessment: 76yo female was admitted earlier this month for LOC>fall that caused mild SDH, CT reveals bilateral upper lobe PE >> started on anticoagulation with argatroban (confirmed h/o HIT) switched to Eliquis, now tx'd to [facility] for vascular  consult/procedure >> to hold Eliquis and resume argatroban; will dose conservatively given recent SDH (SDH resolved on CT).  Follow up aptt on argatroban continues to be at goal at 57s.  No overt bleeding or complications noted.  Goal of Therapy:  aPTT 50-70 seconds Monitor platelets by anticoagulation protocol: Yes   Plan:  Continue argatroban 0.5 mcg/kg/min. Recheck aPTT in am  Erin Hearing PharmD., BCPS Clinical Pharmacist 10/18/2021 5:36 PM

## 2021-10-18 NOTE — Plan of Care (Signed)
Discussed with patient and husband plan of care for the evening, pain management and bedtime medications with some teach back displayed.  Patient's husband assisting her to the bathroom and from chair to bed.  Problem: Education: Goal: Knowledge of General Education information will improve Description: Including pain rating scale, medication(s)/side effects and non-pharmacologic comfort measures Outcome: Progressing   Problem: Health Behavior/Discharge Planning: Goal: Ability to manage health-related needs will improve Outcome: Progressing

## 2021-10-18 NOTE — Progress Notes (Signed)
ANTICOAGULATION CONSULT NOTE - Initial Consult  Pharmacy Consult for argatroban Indication: pulmonary embolus  Allergies  Allergen Reactions   Iodinated Contrast Media Hives and Rash   Atorvastatin Calcium Other (See Comments)    Musculoskeletal aches Other reaction(s): Other (See Comments) Musculoskeletal aches   Doxycycline Other (See Comments)    Liver problems Other reaction(s): Other (See Comments) Liver problems   Heparin Other (See Comments)    Causes blood clots. Other reaction(s): Other (See Comments) Causes blood clots.   Lisinopril Other (See Comments)    cough Other reaction(s): Other (See Comments) cough   Lovastatin Diarrhea   Ropinirole Hcl Nausea Only    Patient Measurements: Height: '5\' 4"'$  (162.6 cm) Weight: 59.4 kg (131 lb) IBW/kg (Calculated) : 54.7  Vital Signs: Temp: 98.1 F (36.7 C) (05/26 2000) Temp Source: Oral (05/26 2000) BP: 110/60 (05/27 0020) Pulse Rate: 65 (05/27 0020)  Labs: Recent Labs    10/16/21 0458 10/17/21 0513 10/18/21 0047  HGB 8.0* 9.1* 8.8*  HCT 24.1* 27.3* 26.6*  PLT 163 170 212  CREATININE 1.04* 1.02* 1.32*     Estimated Creatinine Clearance: 31.8 mL/min (A) (by C-G formula based on SCr of 1.32 mg/dL (H)).   Medical History: Past Medical History:  Diagnosis Date   Dysphasia    Endometrial cancer ([place])    Heel spur    Heparin induced thrombocytopenia (HCC)    Hypercholesteremia    Hypertension    Hypothyroidism    Migraine headache    Osteopenia    RLS (restless legs syndrome)    SVC syndrome    Thyroid disease     Medications:  Medications Prior to Admission  Medication Sig Dispense Refill Last Dose   albuterol (VENTOLIN HFA) 108 (90 Base) MCG/ACT inhaler Inhale 2 puffs into the lungs every 6 (six) hours as needed for wheezing or shortness of breath. 8 g 2 unknown   Ascorbic Acid (VITAMIN C) 1000 MG tablet Take 1,000 mg by mouth 2 (two) times a day.   10/14/2021   b complex vitamins tablet Take 2  tablets by mouth daily.   10/14/2021   diltiazem (CARDIZEM CD) 180 MG 24 hr capsule Take 180 mg by mouth daily.   10/14/2021   guaiFENesin (MUCINEX) 600 MG 12 hr tablet Take 1 tablet (600 mg total) by mouth 2 (two) times daily. 30 tablet 0 10/14/2021   hydrALAZINE (APRESOLINE) 50 MG tablet Take 1 tablet (50 mg total) by mouth 3 (three) times daily. 90 tablet 1 10/14/2021   isosorbide mononitrate (IMDUR) 30 MG 24 hr tablet Take 1 tablet (30 mg total) by mouth daily. 30 tablet 1 10/14/2021   levothyroxine (SYNTHROID) 75 MCG tablet Take 75 mcg by mouth daily before breakfast.   10/14/2021   Magnesium Malate 1250 (141.7 Mg) MG TABS Take 1,250 mg by mouth. Two tablets daily.   10/14/2021   Misc Natural Products (YUMVS BEET ROOT-TART CHERRY PO) Take by mouth.   Past Week   potassium chloride (KLOR-CON M) 10 MEQ tablet Take 10 mEq by mouth daily.   10/14/2021   pregabalin (LYRICA) 100 MG capsule TAKE 2 CAPSULES AT BEDTIME. MAY TAKE AN EXTRA ONE IF NEEDED. 90 capsule 3 Past Week   Rotigotine (NEUPRO) 1 MG/24HR PT24 Place 1 patch (1 mg total) onto the skin daily. 90 patch 3 10/14/2021   sodium chloride 1 g tablet Take 1 g by mouth 3 (three) times daily.   10/14/2021   torsemide (DEMADEX) 20 MG tablet Take 20 mg by  mouth daily.   10/14/2021   UNABLE TO FIND Take 500 mg by mouth daily. Med Name: Beet Root   10/14/2021   Scheduled:   [START ON 10/19/2021] apixaban  10 mg Oral BID   Followed by   Derrill Memo ON 10/25/2021] apixaban  5 mg Oral BID   diltiazem  180 mg Oral Daily   feeding supplement  237 mL Oral BID BM   furosemide  40 mg Oral Daily   guaiFENesin  600 mg Oral BID   isosorbide mononitrate  30 mg Oral Daily   levothyroxine  75 mcg Oral QAC breakfast   nitrofurantoin (macrocrystal-monohydrate)  100 mg Oral Daily   potassium chloride  30 mEq Oral Daily   predniSONE  50 mg Oral Q breakfast   pregabalin  200 mg Oral QHS   Rotigotine  1 mg Transdermal Daily   saccharomyces boulardii  250 mg Oral Daily     Assessment: 76yo female was admitted earlier this month for LOC>fall that caused mild SDH, CT reveals bilateral upper lobe PE >> started on anticoagulation with argatroban (confirmed h/o HIT) switched to Eliquis, now tx'd to [facility] for vascular consult/procedure >> to hold Eliquis and resume argatroban; will dose conservatively given recent SDH (SDH resolved on CT).  Goal of Therapy:  aPTT 50-70 seconds Monitor platelets by anticoagulation protocol: Yes   Plan:  Argatroban 0.5 mcg/kg/min. Monitor aPTT and CBC.  Wynona Neat, PharmD, BCPS  10/18/2021,4:49 AM

## 2021-10-19 ENCOUNTER — Encounter (HOSPITAL_COMMUNITY)
Admission: EM | Disposition: A | Discharge status: Home Health | Payer: Self-pay | Source: Home / Self Care | Attending: Family Medicine

## 2021-10-19 ENCOUNTER — Encounter (HOSPITAL_COMMUNITY): Payer: Self-pay | Admitting: Internal Medicine

## 2021-10-19 ENCOUNTER — Inpatient Hospital Stay (HOSPITAL_COMMUNITY): Payer: Medicare PPO | Admitting: Anesthesiology

## 2021-10-19 ENCOUNTER — Inpatient Hospital Stay (HOSPITAL_COMMUNITY): Payer: Medicare PPO

## 2021-10-19 DIAGNOSIS — E039 Hypothyroidism, unspecified: Secondary | ICD-10-CM | POA: Diagnosis not present

## 2021-10-19 DIAGNOSIS — I2699 Other pulmonary embolism without acute cor pulmonale: Secondary | ICD-10-CM | POA: Diagnosis not present

## 2021-10-19 DIAGNOSIS — I509 Heart failure, unspecified: Secondary | ICD-10-CM

## 2021-10-19 DIAGNOSIS — J9601 Acute respiratory failure with hypoxia: Secondary | ICD-10-CM | POA: Diagnosis not present

## 2021-10-19 DIAGNOSIS — J90 Pleural effusion, not elsewhere classified: Secondary | ICD-10-CM | POA: Diagnosis not present

## 2021-10-19 DIAGNOSIS — I82210 Acute embolism and thrombosis of superior vena cava: Secondary | ICD-10-CM

## 2021-10-19 DIAGNOSIS — I110 Hypertensive heart disease with heart failure: Secondary | ICD-10-CM

## 2021-10-19 HISTORY — PX: ANGIOPLASTY: SHX39

## 2021-10-19 HISTORY — PX: INSERTION OF ILIAC STENT: SHX6256

## 2021-10-19 HISTORY — PX: ABDOMINAL AORTIC ENDOVASCULAR STENT GRAFT: SHX5707

## 2021-10-19 LAB — CBC
HCT: 27.3 % — ABNORMAL LOW (ref 36.0–46.0)
Hemoglobin: 9.2 g/dL — ABNORMAL LOW (ref 12.0–15.0)
MCH: 31.6 pg (ref 26.0–34.0)
MCHC: 33.7 g/dL (ref 30.0–36.0)
MCV: 93.8 fL (ref 80.0–100.0)
Platelets: 193 10*3/uL (ref 150–400)
RBC: 2.91 MIL/uL — ABNORMAL LOW (ref 3.87–5.11)
RDW: 14.1 % (ref 11.5–15.5)
WBC: 9.6 10*3/uL (ref 4.0–10.5)
nRBC: 0 % (ref 0.0–0.2)

## 2021-10-19 LAB — BASIC METABOLIC PANEL
Anion gap: 9 (ref 5–15)
BUN: 43 mg/dL — ABNORMAL HIGH (ref 8–23)
CO2: 26 mmol/L (ref 22–32)
Calcium: 7.7 mg/dL — ABNORMAL LOW (ref 8.9–10.3)
Chloride: 103 mmol/L (ref 98–111)
Creatinine, Ser: 1.11 mg/dL — ABNORMAL HIGH (ref 0.44–1.00)
GFR, Estimated: 52 mL/min — ABNORMAL LOW (ref >60)
Glucose, Bld: 130 mg/dL — ABNORMAL HIGH (ref 70–99)
Potassium: 3.9 mmol/L (ref 3.5–5.1)
Sodium: 138 mmol/L (ref 135–145)

## 2021-10-19 LAB — APTT: aPTT: 54 seconds — ABNORMAL HIGH (ref 24–36)

## 2021-10-19 LAB — MAGNESIUM: Magnesium: 1.9 mg/dL (ref 1.7–2.4)

## 2021-10-19 SURGERY — INSERTION, ENDOVASCULAR STENT GRAFT, AORTA, ABDOMINAL
Anesthesia: General | Site: Groin | Laterality: Right

## 2021-10-19 MED ORDER — SODIUM CHLORIDE 0.9 % IR SOLN
Status: DC | PRN
  Administered 2021-10-19: 1000 mL

## 2021-10-19 MED ORDER — PHENYLEPHRINE 80 MCG/ML (10ML) SYRINGE FOR IV PUSH (FOR BLOOD PRESSURE SUPPORT)
PREFILLED_SYRINGE | INTRAVENOUS | Status: AC
  Filled 2021-10-19: qty 10

## 2021-10-19 MED ORDER — ROCURONIUM BROMIDE 10 MG/ML (PF) SYRINGE
PREFILLED_SYRINGE | INTRAVENOUS | Status: DC | PRN
  Administered 2021-10-19: 50 mg via INTRAVENOUS

## 2021-10-19 MED ORDER — CEFAZOLIN SODIUM-DEXTROSE 2-3 GM-%(50ML) IV SOLR
INTRAVENOUS | Status: DC | PRN
  Administered 2021-10-19: 2 g via INTRAVENOUS

## 2021-10-19 MED ORDER — LIDOCAINE 2% (20 MG/ML) 5 ML SYRINGE
INTRAMUSCULAR | Status: DC | PRN
  Administered 2021-10-19: 80 mg via INTRAVENOUS

## 2021-10-19 MED ORDER — PROPOFOL 10 MG/ML IV BOLUS
INTRAVENOUS | Status: DC | PRN
  Administered 2021-10-19: 90 mg via INTRAVENOUS

## 2021-10-19 MED ORDER — ONDANSETRON HCL 4 MG/2ML IJ SOLN
INTRAMUSCULAR | Status: AC
  Filled 2021-10-19: qty 2

## 2021-10-19 MED ORDER — IODIXANOL 320 MG/ML IV SOLN
INTRAVENOUS | Status: DC | PRN
  Administered 2021-10-19: 85 mL

## 2021-10-19 MED ORDER — CEFAZOLIN SODIUM-DEXTROSE 2-4 GM/100ML-% IV SOLN: 2.0000 g | Freq: Once | INTRAVENOUS | Status: DC

## 2021-10-19 MED ORDER — PHENYLEPHRINE HCL-NACL 20-0.9 MG/250ML-% IV SOLN
INTRAVENOUS | Status: DC | PRN
  Administered 2021-10-19: 15 ug/min via INTRAVENOUS

## 2021-10-19 MED ORDER — CHLORHEXIDINE GLUCONATE 0.12 % MT SOLN: 15.0000 mL | Freq: Once | OROMUCOSAL | Status: AC

## 2021-10-19 MED ORDER — LACTATED RINGERS IV SOLN: INTRAVENOUS | Status: DC

## 2021-10-19 MED ORDER — CHLORHEXIDINE GLUCONATE 0.12 % MT SOLN
OROMUCOSAL | Status: AC
  Administered 2021-10-19: 15 mL via OROMUCOSAL
  Filled 2021-10-19: qty 15

## 2021-10-19 MED ORDER — LACTATED RINGERS IV SOLN
INTRAVENOUS | Status: DC | PRN
Start: 2021-10-19 — End: 2021-10-19

## 2021-10-19 MED ORDER — PROPOFOL 10 MG/ML IV BOLUS
INTRAVENOUS | Status: AC
  Filled 2021-10-19: qty 20

## 2021-10-19 MED ORDER — DEXAMETHASONE SODIUM PHOSPHATE 10 MG/ML IJ SOLN
INTRAMUSCULAR | Status: AC
  Filled 2021-10-19: qty 1

## 2021-10-19 MED ORDER — ORAL CARE MOUTH RINSE: 15.0000 mL | Freq: Once | OROMUCOSAL | Status: AC

## 2021-10-19 MED ORDER — LIDOCAINE 2% (20 MG/ML) 5 ML SYRINGE
INTRAMUSCULAR | Status: AC
  Filled 2021-10-19: qty 5

## 2021-10-19 MED ORDER — ONDANSETRON HCL 4 MG/2ML IJ SOLN
INTRAMUSCULAR | Status: DC | PRN
  Administered 2021-10-19: 4 mg via INTRAVENOUS

## 2021-10-19 MED ORDER — FENTANYL CITRATE (PF) 100 MCG/2ML IJ SOLN: 25.0000 ug | INTRAMUSCULAR | Status: DC | PRN

## 2021-10-19 MED ORDER — FENTANYL CITRATE (PF) 250 MCG/5ML IJ SOLN
INTRAMUSCULAR | Status: AC
  Filled 2021-10-19: qty 5

## 2021-10-19 MED ORDER — SUGAMMADEX SODIUM 200 MG/2ML IV SOLN
INTRAVENOUS | Status: DC | PRN
  Administered 2021-10-19: 200 mg via INTRAVENOUS

## 2021-10-19 MED ORDER — ROCURONIUM BROMIDE 10 MG/ML (PF) SYRINGE
PREFILLED_SYRINGE | INTRAVENOUS | Status: AC
  Filled 2021-10-19: qty 10

## 2021-10-19 MED ORDER — CEFAZOLIN SODIUM-DEXTROSE 2-4 GM/100ML-% IV SOLN
INTRAVENOUS | Status: AC
  Filled 2021-10-19: qty 100

## 2021-10-19 SURGICAL SUPPLY — 70 items
ADH SKN CLS APL DERMABOND .7 (GAUZE/BANDAGES/DRESSINGS) ×2
BAG COUNTER SPONGE SURGICOUNT (BAG) ×3 IMPLANT
BAG SPNG CNTER NS LX DISP (BAG) ×2
BALLN MUSTANG 8X60X75 (BALLOONS) ×3
BALLN MUSTANG 8X80X75 (BALLOONS) ×3
BALLN MUSTANG 9X40X75 (BALLOONS) ×3
BALLOON MUSTANG 8X60X75 (BALLOONS) IMPLANT
BALLOON MUSTANG 8X80X75 (BALLOONS) IMPLANT
BALLOON MUSTANG 9X40X75 (BALLOONS) IMPLANT
BLADE CLIPPER SURG (BLADE) ×3 IMPLANT
CANISTER SUCT 3000ML PPV (MISCELLANEOUS) ×3 IMPLANT
CATH BEACON 5 .035 40 KMP TP (CATHETERS) IMPLANT
CATH BEACON 5 .035 65 KMP TIP (CATHETERS) ×2 IMPLANT
CATH BEACON 5 .038 40 KMP TP (CATHETERS) ×3
CATH BEACON 5.038 65CM KMP-01 (CATHETERS) ×4 IMPLANT
CATH OMNI FLUSH .035X70CM (CATHETERS) ×2 IMPLANT
CATH QUICKCROSS SUPP .035X90CM (MICROCATHETER) ×1 IMPLANT
CLIP LIGATING EXTRA MED SLVR (CLIP) IMPLANT
CLIP LIGATING EXTRA SM BLUE (MISCELLANEOUS) IMPLANT
DERMABOND ADVANCED (GAUZE/BANDAGES/DRESSINGS) ×1
DERMABOND ADVANCED .7 DNX12 (GAUZE/BANDAGES/DRESSINGS) ×2 IMPLANT
DEVICE CLOSURE PERCLS PRGLD 6F (VASCULAR PRODUCTS) ×8 IMPLANT
DEVICE TORQUE KENDALL .025-038 (MISCELLANEOUS) ×1 IMPLANT
DRAPE BRACHIAL (DRAPES) ×2 IMPLANT
DRSG TEGADERM 2-3/8X2-3/4 SM (GAUZE/BANDAGES/DRESSINGS) ×6 IMPLANT
ELECT REM PT RETURN 9FT ADLT (ELECTROSURGICAL) ×6
ELECTRODE REM PT RTRN 9FT ADLT (ELECTROSURGICAL) ×4 IMPLANT
GAUZE SPONGE 2X2 8PLY STRL LF (GAUZE/BANDAGES/DRESSINGS) ×4 IMPLANT
GAUZE SPONGE 4X4 12PLY STRL (GAUZE/BANDAGES/DRESSINGS) ×1 IMPLANT
GLOVE BIO SURGEON STRL SZ7.5 (GLOVE) ×3 IMPLANT
GOWN STRL REUS W/ TWL LRG LVL3 (GOWN DISPOSABLE) ×4 IMPLANT
GOWN STRL REUS W/ TWL XL LVL3 (GOWN DISPOSABLE) ×4 IMPLANT
GOWN STRL REUS W/TWL LRG LVL3 (GOWN DISPOSABLE) ×6
GOWN STRL REUS W/TWL XL LVL3 (GOWN DISPOSABLE) ×6
GRAFT BALLN CATH 65CM (STENTS) ×2 IMPLANT
GUIDEWIRE ANGLED .035X150CM (WIRE) ×1 IMPLANT
GUIDEWIRE ANGLED .035X260CM (WIRE) ×1 IMPLANT
KIT BASIN OR (CUSTOM PROCEDURE TRAY) ×3 IMPLANT
KIT DRAIN CSF ACCUDRAIN (MISCELLANEOUS) IMPLANT
KIT ENCORE 26 ADVANTAGE (KITS) ×1 IMPLANT
KIT TURNOVER KIT B (KITS) ×3 IMPLANT
NS IRRIG 1000ML POUR BTL (IV SOLUTION) ×3 IMPLANT
PACK ENDOVASCULAR (PACKS) ×2 IMPLANT
PAD ARMBOARD 7.5X6 YLW CONV (MISCELLANEOUS) ×6 IMPLANT
PERCLOSE PROGLIDE 6F (VASCULAR PRODUCTS)
SET MICROPUNCTURE 5F STIFF (MISCELLANEOUS) ×3 IMPLANT
SHEATH BRITE TIP 8FR 23CM (SHEATH) ×2 IMPLANT
SHEATH FLEXOR ANSEL 1 7F 45CM (SHEATH) ×1 IMPLANT
SHEATH PINNACLE 5F 10CM (SHEATH) ×2 IMPLANT
SHEATH PINNACLE 8F 10CM (SHEATH) ×2 IMPLANT
SNARE GOOSENECK 15MM (MISCELLANEOUS) ×1 IMPLANT
SPONGE GAUZE 2X2 STER 10/PKG (GAUZE/BANDAGES/DRESSINGS) ×2
STENT EXPRESS LD 10X57X75 (Permanent Stent) ×1 IMPLANT
STENT GRAFT BALLN CATH 65CM (STENTS)
STOPCOCK MORSE 400PSI 3WAY (MISCELLANEOUS) ×4 IMPLANT
SUT MNCRL AB 4-0 PS2 18 (SUTURE) ×4 IMPLANT
SUT PROLENE 5 0 C 1 24 (SUTURE) IMPLANT
SUT SILK 2 0 PERMA HAND 18 BK (SUTURE) ×2 IMPLANT
SUT VIC AB 2-0 CT1 27 (SUTURE)
SUT VIC AB 2-0 CT1 TAPERPNT 27 (SUTURE) IMPLANT
SUT VIC AB 3-0 SH 27 (SUTURE)
SUT VIC AB 3-0 SH 27X BRD (SUTURE) IMPLANT
SYR 20ML LL LF (SYRINGE) ×3 IMPLANT
TOWEL GREEN STERILE (TOWEL DISPOSABLE) ×3 IMPLANT
TRAY FOLEY MTR SLVR 16FR STAT (SET/KITS/TRAYS/PACK) ×2 IMPLANT
TUBING INJECTOR 48 (MISCELLANEOUS) ×2 IMPLANT
WIRE AMPLATZ SS-J .035X180CM (WIRE) ×4 IMPLANT
WIRE BENTSON .035X145CM (WIRE) ×6 IMPLANT
WIRE ROSEN 145CM (WIRE) ×1 IMPLANT
WIRE ROSEN-J .035X260CM (WIRE) ×1 IMPLANT

## 2021-10-19 NOTE — Op Note (Signed)
Patient name: [name] MRN: [id number] DOB: 01/14/1946 Sex: female  10/19/2021 Pre-operative Diagnosis: Symptomatic SVC occlusion Post-operative diagnosis:  Same Surgeon:  Erlene Quan C. Donzetta Matters, MD Procedure Performed: 1.  Ultrasound-guided cannulation right basilic vein 2.  Ultrasound-guided cannulation right common femoral vein 3.  Right upper extremity and central venogram 4.  Stent of SVC with 10 x 57 mm LD express 5.  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  She now has recurrent pleural effusions with an occluded SVC which may be contributory and she is indicated for venogram with possible intervention.  Findings: The basilic vein ended at the axillary vein which was occluded for a long segment into the subclavian vein.  The innominate vein was patent the SVC was occluded.  I attempted to gain access retrograde but ultimately gained access antegrade from the basilic vein and was able to snare wire and pull through and through access and balloon to channel from the SVC back through the right innominate vein and subclavian and axillary veins.  After this I used the retrograde femoral sheath was able to get into the IJ and balloon in the right innominate vein and internal jugular vein on the right with an 8 mm balloon as well.  I then ultimately elected to stent from the basilic vein sheath with a balloon expandable 10 x 57 mm stent and at completion where previously the SVC was occluded there was no residual stenosis.  The innominate vein was patent.  I was never able to identify a definitive left innominate vein however where there was a knob stented below this in case there is inflow from the left side or from collaterals on the left.  There was no residual stenosis in the previously  occluded right axillary and subclavian veins.   Procedure:  The patient was identified in the holding area and taken to the operating where she was placed supine operative table and general anesthesia was induced.  She was gently prepped and draped in the right upper extremity in the right groin, antibiotics were administered and a timeout was called.  I first used ultrasound to identify the right basilic vein this was cannulated with a micropuncture needle followed by wire and a sheath.  I then placed a Bentson wire followed by 5 French sheath performed right upper extremity angiography.  This demonstrated occluded axillary and subclavian veins and I attempted to cross initially but I could not.  Patient remained on anticoagulation throughout the case no additional was given.  Ultrasound used to then identify the right common femoral vein and this was patent and compressible and it was cannulated with a micropuncture needle followed by wire and sheath.  I then placed a long Rosen wire up to the level of the right atrium and placed a long 7 French sheath over this.  I performed retrograde angiography which demonstrated occlusion of the SVC but I could not engage this using multiple wires and catheters from below.  I turned my attention back to the right arm with little hope that this would work.  I was ultimately able to start a channel using a Ber catheter and Glidewire and then exchanged for a quick cross catheter and ultimately the wire did flip into the right atrium and I was able to confirm intraluminal access.  I then snared  a long Glidewire from the basilic vein down to the groin.  I then dilated the wire tract with an 8 mm balloon and angiography demonstrated a smooth tract.  Given that I do not want to jail the right IJ from the femoral sheath I then used a buddy wire technique used a Berenstein catheter and Glidewire up into the IJ and confirmed access there.  I then ballooned the IJ and the innominate  vein heading north into the IJ.  I then elected to stent from the right basilic vein access.  I placed a long 7 [place] sheath past the access point.  I actually remove the 7 French sheath from the right groin and placed a bolster there.  I then primarily stented the superior vena cava staying below what appeared to be a knob of the left innominate vein.  Completion demonstrated no residual stenosis there and I ballooned the wire tract back above this and the innominate vein and subclavian vein.  At completion there was a defect in the stent which I could not tell if this was thrombus or just behind the stent and this was ballooned with a 9 mm balloon and did resolve and at completion there was brisk flow from the basilic vein sheath all the way through to the right atrium fill in the pulmonary vasculature.  Satisfied with this I then removed the sheath and placed a suture bolster.  Patient was then awakened from anesthesia having tolerated procedure well without any complication.  All counts were correct at completion.  Contrast: 85cc  EBL: 50cc   Doratha Mcswain C. Donzetta Matters, MD [facility] Office: [phone] Pager: [phone]

## 2021-10-19 NOTE — Anesthesia Preprocedure Evaluation (Signed)
Anesthesia Evaluation  Patient identified by MRN, date of birth, ID band Patient awake    Reviewed: Allergy & Precautions, NPO status , Patient's Chart, lab work & pertinent test results  Airway Mallampati: II  TM Distance: >3 FB Neck ROM: Full    Dental  (+) Teeth Intact, Dental Advisory Given   Pulmonary pneumonia, PE R > L recurrent, symptomatic pleural effusions: ?if secondary to SVC syndrome.  - s/p R thoracentesis 1.2 L, transudate, with negative cytology during prior admission. Repeated 5/24, 1.4L clear yellow with protein level suggestive technically of exudate. Cytology without malignancy identified. - s/p L thoracentesis this admission 5/25 800cc fluid discarded, no labs.     + rhonchi  + decreased breath sounds      Cardiovascular hypertension, Pt. 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  Reproductive/Obstetrics history of endometrial cancer status post hysterectomy                             Anesthesia Physical Anesthesia Plan  ASA: 3  Anesthesia Plan: General   Post-op Pain Management: Ofirmev IV (intra-op)*   Induction: Intravenous  PONV Risk Score and Plan: 3 and Dexamethasone and Ondansetron  Airway Management Planned: Oral ETT  Additional Equipment:   Intra-op Plan:   Post-operative Plan: Possible Post-op intubation/ventilation  Informed Consent: I have reviewed the patients History and Physical, chart, labs and discussed the procedure including  the risks, benefits and alternatives for the proposed anesthesia with the patient or authorized representative who has indicated his/her understanding and acceptance.     Dental advisory given  Plan Discussed with: CRNA  Anesthesia Plan Comments:         Anesthesia Quick Evaluation

## 2021-10-19 NOTE — Anesthesia Procedure Notes (Signed)
Procedure Name: Intubation Date/Time: 10/19/2021 9:02 AM Performed by: Eligha Bridegroom, CRNA Pre-anesthesia Checklist: Patient identified, Suction available, Patient being monitored, Timeout performed and Emergency Drugs available Patient Re-evaluated:Patient Re-evaluated prior to induction Oxygen Delivery Method: Circle system utilized Preoxygenation: Pre-oxygenation with 100% oxygen Induction Type: IV induction Ventilation: Mask ventilation without difficulty and Oral airway inserted - appropriate to patient size Laryngoscope Size: Mac and 3 Grade View: Grade II Tube type: Oral Tube size: 7.0 mm Number of attempts: 1 Airway Equipment and Method: Stylet Placement Confirmation: ETT inserted through vocal cords under direct vision, positive ETCO2 and breath sounds checked- equal and bilateral Secured at: 21 cm Tube secured with: Tape Dental Injury: Teeth and Oropharynx as per pre-operative assessment

## 2021-10-19 NOTE — Transfer of Care (Signed)
Immediate Anesthesia Transfer of Care Note  Patient: [name]  Procedure(s) Performed: CENTRAL VENOGRAM (Right: Groin) BALLOON ANGIOPLASTY INTERNAL JUGULAR, INNOMINATE AND SUBCLAVIAN (Right: Arm Upper) INSERTION OF SUBCLAVIAN  STENT (Right: Arm Upper)  Patient Location: PACU  Anesthesia Type:General  Level of Consciousness: awake and alert   Airway & Oxygen Therapy: Patient Spontanous Breathing and Patient connected to nasal cannula oxygen  Post-op Assessment: Report given to RN and Post -op Vital signs reviewed and stable  Post vital signs: Reviewed and stable  Last Vitals:  Vitals Value Taken Time  BP 153/64 10/19/21 1215  Temp 36.8 C 10/19/21 1215  Pulse 75 10/19/21 1225  Resp 16 10/19/21 1225  SpO2 96 % 10/19/21 1225  Vitals shown include unvalidated device data.  Last Pain:  Vitals:   10/19/21 1215  TempSrc:   PainSc: 0-No pain         Complications: No notable events documented.

## 2021-10-19 NOTE — Progress Notes (Signed)
[place] for argatroban Indication: pulmonary embolus  Allergies  Allergen Reactions   Iodinated Contrast Media Hives and Rash   Atorvastatin Calcium Other (See Comments)    Musculoskeletal aches Other reaction(s): Other (See Comments) Musculoskeletal aches   Doxycycline Other (See Comments)    Liver problems Other reaction(s): Other (See Comments) Liver problems   Heparin Other (See Comments)    Causes blood clots. Other reaction(s): Other (See Comments) Causes blood clots.   Lisinopril Other (See Comments)    cough Other reaction(s): Other (See Comments) cough   Lovastatin Diarrhea   Ropinirole Hcl Nausea Only    Patient Measurements: Height: 5' 4.02" (162.6 cm) Weight: 60.2 kg (132 lb 11.5 oz) IBW/kg (Calculated) : 54.74  Vital Signs: Temp: 97.6 F (36.4 C) (05/28 1318) Temp Source: Oral (05/28 1318) BP: 156/72 (05/28 1318) Pulse Rate: 72 (05/28 1318)  Labs: Recent Labs    10/17/21 0513 10/18/21 0047 10/18/21 1131 10/18/21 1630 10/19/21 0105 10/19/21 0402  HGB 9.1* 8.8*  --   --  9.2*  --   HCT 27.3* 26.6*  --   --  27.3*  --   PLT 170 212  --   --  193  --   APTT  --   --  58* 57*  --  54*  CREATININE 1.02* 1.32*  --   --  1.11*  --      Estimated Creatinine Clearance: 37.8 mL/min (A) (by C-G formula based on SCr of 1.11 mg/dL (H)).   Medical History: Past Medical History:  Diagnosis Date   Dysphasia    Endometrial cancer ([place])    Heel spur    Heparin induced thrombocytopenia (HCC)    Hypercholesteremia    Hypertension    Hypothyroidism    Migraine headache    Osteopenia    RLS (restless legs syndrome)    SVC syndrome    Thyroid disease     Medications:   argatroban 0.5 mcg/kg/min (10/19/21 0939)   ceFAZolin      Assessment: 76yo female was admitted earlier this month for LOC>fall that caused mild SDH, CT reveals bilateral upper lobe PE >> started on anticoagulation with argatroban (confirmed  h/o HIT) switched to Eliquis, now tx'd to [facility] for vascular consult/procedure >> to hold Eliquis and resume argatroban; will dose conservatively given recent SDH (SDH resolved on CT).  Pt is s/p cannulation and angioplasty of subclavian veins today. Her PTT was therapeutic again this AM.  Hgb 9.2, plt wnl  Goal of Therapy:  aPTT 50-70 seconds Monitor platelets by anticoagulation protocol: Yes   Plan:  Continue argatroban 0.5 mcg/kg/min. Daily aPTT  F/u resume apixaban  Onnie Boer, PharmD, BCIDP, AAHIVP, CPP Infectious Disease Pharmacist 10/19/2021 1:46 PM

## 2021-10-19 NOTE — Progress Notes (Signed)
Patient developed hematoma on upper R brachial site. Bruising marked previously with no changes. RN applied gentle pressure and hematoma decreased to around a little less than 1 cm. Strong palpable pulses in R arm.  VSS.  Hospitalist and Vascular surgeon notified.    Orders received during shift change from Vascular surgeon to wrap hematoma with ace wrap. RN passed this information to night shift nurse.

## 2021-10-19 NOTE — Progress Notes (Signed)
Progress Note  Patient: [name] DOB: 11-05-1945  DOA: 10/14/2021  DOS: 10/19/2021    Brief hospital course: [name] is a 76 y.o. 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  Patient was admitted from 5/6 through 5/11 due to community acquired pneumonia with acute hypoxic respiratory failure and pleural effusion which required right-sided thoracentesis on 09/30/2021 with removal of 1.2 L of pleural fluid noted to be transudative in nature (considered to be parapneumonic in the setting of underlying infection), during hospitalization she was treated with IV Rocephin/azithromycin mucolytic's and bronchodilators and this was transitioned to oral antibiotics prior to discharge.  She also had syncope on that admission with echo revealing G1 DD. Patient now complains of worsening shortness of breath and increased work of breathing, she was discharged with supplemental oxygen at 2 LPM via [place], but patient has gradually increased this to 4 LPM.  She also complaining of increased leg swelling.  She had a follow-up with her PCP who prescribed furosemide, but there was some confusion regarding the dosing, so there was delay in patient starting the medication which was just started today.  She was seen by home health normal who asked her to go to the ED for further evaluation and management.   ED Course:  She was found tachypneic, BP 153/89, O2 sat was 94-96% on 4 L supplemental oxygen .Work-up in the ED showed normocytic anemia, BMP was normal except for BUN of 24, creatinine was 1.03 (this ranged within 1.5-1.7 in the last 2 weeks). Chest x-ray (left decubitus) showed free-flowing left  pleural effusion. Primarily free-flowing right pleural effusion. Portable chest x-ray showed moderate right-greater-than-left pleural effusions, increased in size when compared with prior exam.   Hospitalist was asked to admit patient for further evaluation and management.  Assessment and Plan: Acute hypoxic respiratory failure: Due to pleural effusions, with bilateral PE's and GGOs (possible PNA) contributing.  - Wean oxygen as able, down to 4L and stable.  - If cough or fever develops or pleural culture becomes positive, would initiate abx.   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 Evidence of some collateralization.  - Vascular surgery consulted, patient taken for multivessel balloon angioplasty (right axillary, subclavian, innominate, internal jugular veins) and SVC stent 5/28 by Dr. Donzetta Matters. Continuing argatroban ok for now. Note history of reaction to iodinated contrast for which she was premedicated with prednisone and has no reported reaction at this time.  R > L recurrent, symptomatic pleural effusions: ?if secondary to SVC syndrome.   - s/p R thoracentesis 1.2 L, transudate, with negative cytology during prior admission. Repeated 5/24, 1.4L clear yellow with protein level suggestive technically of exudate. Cytology without malignancy identified. - s/p L thoracentesis this admission 5/25 800cc fluid discarded, no labs.    Bilateral pulmonary embolism: No CT evidence of R heart strain. RV normal size/function by echo.  - Continue anticoagulation with argatroban for now, convert back to DOAC once cleared by vascular surgery.    History of fall with SDH: Most recent CT shows resolution of SDH.  - Approaching anticoagulation cautiously.    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  - Argatroban as above. Avoid heparin and derivatives.  Tolerates DOAC.  - CBC daily.    Acute on chronic HFpEF: with LVEF 72%,  G1DD. BNP is low. Less likely to be primary cause of effusions, though with hypoalbuminemia, could be contributing.   - lasix '40mg'$  IV given earlier in hospitalization, will transition back to home po torsemide '20mg'$  daily. She has peripheral edema, though don't want to test diuretic in setting of significant contrast administration.  Hypoalbuminemia, mild protein calorie malnutrition - Supplement protein in diet as much as reasonable    Hypokalemia:  - Augment supplementation.  Hypothyroidism: Recent TSH 2.416.  - Continue synthroid   HTN:  - Continue imdur, diltiazem  Prolonged QT interval:  - Avoid provocative agents.  - Keep K, Mg replete.  - Remain on telemetry.   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    Subjective: No new issues to report. Dyspnea about the same. Symmetric bilateral leg swelling is stable and didn't go down at night/with elevation.   Objective: Vitals:   10/19/21 1318 10/19/21 1333 10/19/21 1348 10/19/21 1420  BP: (!) 156/72 (!) 162/75 (!) 164/71 (!) 164/71  Pulse: 72 74 73 71  Resp:    16  Temp: 97.6 F (36.4 C) 97.8 F (36.6 C) 97.8 F (36.6 C) 98.3 F (36.8 C)  TempSrc: Oral Oral Oral Oral  SpO2: 98% 97% 98% 98%  Weight:      Height:      Gen: Pleasant elderly female in no distress Pulm: Nonlabored breathing supplemental oxygen. No crackles or wheezes. CV: Regular rate and rhythm. No murmur, rub, or gallop. No JVD, 2+ dependent edema. GI: Abdomen soft, non-tender, non-distended, with normoactive bowel sounds.  Ext: Warm, no deformities Skin: No new rashes, lesions or ulcers on visualized skin. Neuro: Alert and oriented. No focal neurological deficits. Psych: Judgement and insight appear fair. Mood euthymic & affect congruent. Behavior is appropriate.    Data Personally reviewed:  CBC: Recent Labs  Lab 10/14/21 1845 10/15/21 0452 10/16/21 0458 10/17/21 0513 10/18/21 0047 10/19/21 0105  WBC 10.1 7.0  10.5 10.5 13.5* 9.6  NEUTROABS 8.8*  --   --   --   --   --   HGB 10.6* 9.4* 8.0* 9.1* 8.8* 9.2*  HCT 31.7* 28.8* 24.1* 27.3* 26.6* 27.3*  MCV 94.3 94.4 95.3 95.1 94.7 93.8  PLT 238 216 163 170 212 786   Basic Metabolic Panel: Recent Labs  Lab 10/15/21 0452 10/16/21 0458 10/17/21 0513 10/18/21 0047 10/19/21 0105  NA 141 139 138 137 138  K 3.8 2.8* 3.1* 3.3* 3.9  CL 108 101 99 99 103  CO2 '27 30 28 28 26  '$ GLUCOSE 94 98 136* 145* 130*  BUN 21 18 26* 42* 43*  CREATININE 0.95 1.04* 1.02* 1.32* 1.11*  CALCIUM 7.9* 7.4* 7.5* 7.6* 7.7*  MG 1.6*  --  2.2  --  1.9  PHOS 2.4*  --   --   --   --    GFR: Estimated Creatinine Clearance: 37.8 mL/min (A) (by C-G formula based on SCr of 1.11 mg/dL (H)). Liver Function Tests: Recent Labs  Lab 10/14/21 1845 10/15/21 0452  AST 27 21  ALT 32 27  ALKPHOS 153* 134*  BILITOT 0.6 0.5  PROT 6.3* 5.5*  ALBUMIN 3.3* 2.9*   No results for input(s): LIPASE, AMYLASE in the last 168 hours. No results for input(s): AMMONIA in the last 168 hours. Coagulation Profile: No results for input(s): INR, PROTIME in the last 168 hours. Cardiac Enzymes:  No results for input(s): CKTOTAL, CKMB, CKMBINDEX, TROPONINI in the last 168 hours. BNP (last 3 results) No results for input(s): PROBNP in the last 8760 hours. HbA1C: No results for input(s): HGBA1C in the last 72 hours. CBG: No results for input(s): GLUCAP in the last 168 hours. Lipid Profile: No results for input(s): CHOL, HDL, LDLCALC, TRIG, CHOLHDL, LDLDIRECT in the last 72 hours. Thyroid Function Tests: No results for input(s): TSH, T4TOTAL, FREET4, T3FREE, THYROIDAB in the last 72 hours. Anemia Panel: No results for input(s): VITAMINB12, FOLATE, FERRITIN, TIBC, IRON, RETICCTPCT in the last 72 hours. Urine analysis:    Component Value Date/Time   COLORURINE YELLOW 09/27/2021 1714   APPEARANCEUR CLEAR 09/27/2021 1714   LABSPEC 1.010 09/27/2021 1714   PHURINE 7.0 09/27/2021 [place] 09/27/2021 1714   HGBUR NEGATIVE 09/27/2021 1714   BILIRUBINUR NEGATIVE 09/27/2021 1714   KETONESUR NEGATIVE 09/27/2021 1714   PROTEINUR NEGATIVE 09/27/2021 1714   UROBILINOGEN 0.2 11/08/2008 1412   NITRITE NEGATIVE 09/27/2021 1714   LEUKOCYTESUR TRACE (A) 09/27/2021 1714   Recent Results (from the past 240 hour(s))  Culture, body fluid w Gram Stain-bottle     Status: None (Preliminary result)   Collection Time: 10/15/21 10:45 AM   Specimen: Pleura  Result Value Ref Range Status   Specimen Description PLEURAL  Final   Special Requests   Final    BOTTLES DRAWN AEROBIC AND ANAEROBIC Blood Culture adequate volume   Culture   Final    NO GROWTH 4 DAYS Performed at [facility], [address], [place], [place] [address]    Report Status PENDING  Incomplete  Gram stain     Status: None   Collection Time: 10/15/21 10:45 AM   Specimen: Pleura  Result Value Ref Range Status   Specimen Description PLEURAL  Final   Special Requests NONE  Final   Gram Stain   Final    CYTOSPIN SMEAR NO ORGANISMS SEEN WBC PRESENT,BOTH PMN AND MONONUCLEAR Performed at [facility], [address], [place], [place] [address]    Report Status 10/15/2021 FINAL  Final  MRSA Next Gen by PCR, Nasal     Status: None   Collection Time: 10/17/21  6:18 PM   Specimen: Nasal Mucosa; Nasal Swab  Result Value Ref Range Status   MRSA by PCR Next Gen NOT DETECTED NOT DETECTED Final    Comment: (NOTE) The GeneXpert MRSA Assay (FDA approved for NASAL specimens only), is one component of a comprehensive MRSA colonization surveillance program. It is not intended to diagnose MRSA infection nor to guide or monitor treatment for MRSA infections. Test performance is not FDA approved in patients less than 18 years old. Performed at [facility], [place] [address] [place], [place] [address]      HYBRID OR IMAGING [facility] ONLY)  Result Date: 10/19/2021 There is no interpretation for this exam.  This order is for  images obtained during a surgical procedure.  Please See "Surgeries" Tab for more information regarding the procedure.    Family Communication: Husband at bedside this AM  Disposition: Status is: Inpatient Remains inpatient appropriate because: Hypoxic respiratory failure, SVC syndrome Planned Discharge Destination:  TBD , will consult PT/OT.    Patrecia Pour, MD 10/19/2021 4:01 PM Page by [email or web address]

## 2021-10-19 NOTE — Plan of Care (Addendum)
Discussed with patient plan of care for the evening, pain management and removal of IV site by bruised area with some teach back displayed.  Hematoma area still in previous marked area from first shift.  Problem: Health Behavior/Discharge Planning: Goal: Ability to manage health-related needs will improve Outcome: Progressing   Problem: Education: Goal: Knowledge of General Education information will improve Description: Including pain rating scale, medication(s)/side effects and non-pharmacologic comfort measures Outcome: Progressing

## 2021-10-19 NOTE — Progress Notes (Signed)
MD removed patient's two vascular site dressings on R upper brachial and R femoral.  After removal, RN able to assess level 1 bruising on R upper brachial site, no hematoma noted.  RN circled bruising with skin marker. R femoral remaining level 0.

## 2021-10-19 NOTE — Anesthesia Postprocedure Evaluation (Signed)
Anesthesia Post Note  Patient: [name]  Procedure(s) Performed: CENTRAL VENOGRAM (Right: Groin) BALLOON ANGIOPLASTY INTERNAL JUGULAR, INNOMINATE AND SUBCLAVIAN (Right: Arm Upper) INSERTION OF SUBCLAVIAN  STENT (Right: Arm Upper)     Patient location during evaluation: PACU Anesthesia Type: General Level of consciousness: awake and alert Pain management: pain level controlled Vital Signs Assessment: post-procedure vital signs reviewed and stable Respiratory status: spontaneous breathing, nonlabored ventilation, respiratory function stable and patient connected to nasal cannula oxygen Cardiovascular status: blood pressure returned to baseline and stable Postop Assessment: no apparent nausea or vomiting Anesthetic complications: no   No notable events documented.  Last Vitals:  Vitals:   10/19/21 1545 10/19/21 1650  BP:    Pulse: 81 76  Resp: 16 18  Temp:    SpO2: 98% 98%    Last Pain:  Vitals:   10/19/21 1450  TempSrc: Oral  PainSc:                  [place] Lighter

## 2021-10-19 NOTE — Progress Notes (Signed)
  Progress Note    10/19/2021 8:24 AM  Subjective: No overnight issues  Vitals:   10/19/21 0419 10/19/21 0729  BP: 134/72 130/72  Pulse: 79 78  Resp: 19 17  Temp: 97.8 F (36.6 C) 98.6 F (37 C)  SpO2: 99% 99%    Physical Exam: Awake alert oriented Nonlabored respirations on nasal cannula oxygen No right upper extremity or neck or face swelling  CBC    Component Value Date/Time   WBC 9.6 10/19/2021 0105   RBC 2.91 (L) 10/19/2021 0105   HGB 9.2 (L) 10/19/2021 0105   HGB 13.8 12/15/2018 1520   HCT 27.3 (L) 10/19/2021 0105   HCT 40.7 12/15/2018 1520   PLT 193 10/19/2021 0105   PLT 222 12/15/2018 1520   MCV 93.8 10/19/2021 0105   MCV 97 12/15/2018 1520   MCH 31.6 10/19/2021 0105   MCHC 33.7 10/19/2021 0105   RDW 14.1 10/19/2021 0105   RDW 13.1 12/15/2018 1520   LYMPHSABS 0.6 (L) 10/14/2021 1845   LYMPHSABS 1.6 12/15/2018 1520   MONOABS 0.5 10/14/2021 1845   EOSABS 0.1 10/14/2021 1845   EOSABS 0.1 12/15/2018 1520   BASOSABS 0.0 10/14/2021 1845   BASOSABS 0.1 12/15/2018 1520    BMET    Component Value Date/Time   NA 138 10/19/2021 0105   NA 132 (L) 12/22/2018 1436   K 3.9 10/19/2021 0105   CL 103 10/19/2021 0105   CO2 26 10/19/2021 0105   GLUCOSE 130 (H) 10/19/2021 0105   BUN 43 (H) 10/19/2021 0105   BUN 13 12/22/2018 1436   CREATININE 1.11 (H) 10/19/2021 0105   CALCIUM 7.7 (L) 10/19/2021 0105   GFRNONAA 52 (L) 10/19/2021 0105   GFRAA 91 12/22/2018 1436    INR    Component Value Date/Time   INR 3.8 (H) 11/10/2008 0650     Intake/Output Summary (Last 24 hours) at 10/19/2021 0824 Last data filed at 10/19/2021 0535 Gross per 24 hour  Intake 586.45 ml  Output --  Net 586.45 ml     Assessment/Plan:  76 y.o. female is here with symptomatic SVC occlusion.  Plan will be for venography from the right upper extremity and right lower extremity approach today.  I have extensively reviewed her CT scan and may not be possible to revascularize the SVC I  discussed this with the patient.  I discussed the risk and benefits of the procedure and she demonstrates good understanding and consent was signed.   Amany Rando C. Donzetta Matters, MD [facility] Office: [phone] Pager: [phone]  10/19/2021 8:24 AM

## 2021-10-20 ENCOUNTER — Other Ambulatory Visit (HOSPITAL_COMMUNITY): Payer: Self-pay

## 2021-10-20 DIAGNOSIS — J90 Pleural effusion, not elsewhere classified: Secondary | ICD-10-CM | POA: Diagnosis not present

## 2021-10-20 DIAGNOSIS — I2699 Other pulmonary embolism without acute cor pulmonale: Secondary | ICD-10-CM | POA: Diagnosis not present

## 2021-10-20 DIAGNOSIS — E039 Hypothyroidism, unspecified: Secondary | ICD-10-CM | POA: Diagnosis not present

## 2021-10-20 DIAGNOSIS — J9601 Acute respiratory failure with hypoxia: Secondary | ICD-10-CM | POA: Diagnosis not present

## 2021-10-20 LAB — CBC
HCT: 27.8 % — ABNORMAL LOW (ref 36.0–46.0)
Hemoglobin: 8.8 g/dL — ABNORMAL LOW (ref 12.0–15.0)
MCH: 31 pg (ref 26.0–34.0)
MCHC: 31.7 g/dL (ref 30.0–36.0)
MCV: 97.9 fL (ref 80.0–100.0)
Platelets: 196 10*3/uL (ref 150–400)
RBC: 2.84 MIL/uL — ABNORMAL LOW (ref 3.87–5.11)
RDW: 14.3 % (ref 11.5–15.5)
WBC: 10.1 10*3/uL (ref 4.0–10.5)
nRBC: 0 % (ref 0.0–0.2)

## 2021-10-20 LAB — CULTURE, BODY FLUID W GRAM STAIN -BOTTLE
Culture: NO GROWTH
Special Requests: ADEQUATE

## 2021-10-20 LAB — BASIC METABOLIC PANEL
Anion gap: 5 (ref 5–15)
BUN: 29 mg/dL — ABNORMAL HIGH (ref 8–23)
CO2: 27 mmol/L (ref 22–32)
Calcium: 7.3 mg/dL — ABNORMAL LOW (ref 8.9–10.3)
Chloride: 107 mmol/L (ref 98–111)
Creatinine, Ser: 1.01 mg/dL — ABNORMAL HIGH (ref 0.44–1.00)
GFR, Estimated: 58 mL/min — ABNORMAL LOW (ref >60)
Glucose, Bld: 130 mg/dL — ABNORMAL HIGH (ref 70–99)
Potassium: 4.5 mmol/L (ref 3.5–5.1)
Sodium: 139 mmol/L (ref 135–145)

## 2021-10-20 LAB — APTT: aPTT: 50 seconds — ABNORMAL HIGH (ref 24–36)

## 2021-10-20 MED ORDER — APIXABAN 5 MG PO TABS: ORAL_TABLET | ORAL | 1 refills | Status: DC

## 2021-10-20 MED ORDER — APIXABAN 5 MG PO TABS: 5.0000 mg | ORAL_TABLET | Freq: Two times a day (BID) | ORAL | Status: DC

## 2021-10-20 MED ORDER — APIXABAN 5 MG PO TABS
10.0000 mg | ORAL_TABLET | Freq: Two times a day (BID) | ORAL | Status: DC
  Administered 2021-10-20: 10 mg via ORAL
  Filled 2021-10-20: qty 2

## 2021-10-20 NOTE — Progress Notes (Signed)
SATURATION QUALIFICATIONS: (This note is used to comply with regulatory documentation for home oxygen)  Patient Saturations on Room Air at Rest = 91%  Patient Saturations on Room Air while Ambulating = 66%  Patient Saturations on 2 Liters of oxygen while Ambulating = 91%  Please briefly explain why patient needs home oxygen:Pt is not able to maintain Sp02 >90% without supplemental 02 so will need it at home. Per pt she already has 02 at home.   Marisa Severin, PT, DPT [facility] Secure chat preferred Office [phone]

## 2021-10-20 NOTE — Discharge Summary (Signed)
Physician Discharge Summary   Patient: [name] MRN: [id number] DOB: 1945/09/19  Admit date:     10/14/2021  Discharge date: 10/20/21  Discharge Physician: Patrecia Pour   PCP: Glenda Chroman, MD   Recommendations at discharge:  Follow up with PCP in the next week.  Monitor for bleeding on eliquis for PE. Recommend repeat CXR and ambulatory pulse oximetry to monitor pleural effusions.  Follow up with vascular surgery, Dr. Donzetta Matters in 4-6 weeks for follow up and RUE/right neck venous duplex.   Discharge Diagnoses: Principal Problem:   Bilateral pleural effusion Active Problems:   Subdural hematoma (HCC)   Acute respiratory failure with hypoxia (HCC)   Bilateral pulmonary embolism (HCC)   Diastolic CHF (HCC)   Prolonged QT interval   Acquired hypothyroidism   Essential hypertension  Hospital Course: [name] is a 76 y.o. 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  Patient was admitted from 5/6 through 5/11 due to community acquired pneumonia with acute hypoxic respiratory failure and pleural effusion which required right-sided thoracentesis on 09/30/2021 with removal of 1.2 L of pleural fluid noted to be transudative in nature (considered to be parapneumonic in the setting of underlying infection), during hospitalization she was treated with IV Rocephin/azithromycin mucolytic's and bronchodilators and this was transitioned to oral antibiotics prior to discharge.  She also had syncope on that admission with echo revealing G1 DD.  Patient now complains of worsening shortness of breath and increased work  of breathing, she was discharged with supplemental oxygen at 2 LPM via [place], but patient has gradually increased this to 4 LPM.  She also complaining of increased leg swelling.  She had a follow-up with her PCP who prescribed furosemide, but there was some confusion regarding the dosing, so there was delay in patient starting the medication which was just started today.  She was seen by home health normal who asked her to go to the ED for further evaluation and management.   ED Course:  She was found tachypneic, BP 153/89, O2 sat was 94-96% on 4 L supplemental oxygen .Work-up in the ED showed normocytic anemia, BMP was normal except for BUN of 24, creatinine was 1.03 (this ranged within 1.5-1.7 in the last 2 weeks). Chest x-ray (left decubitus) showed free-flowing left pleural effusion. Primarily free-flowing right pleural effusion. Portable chest x-ray showed moderate right-greater-than-left pleural effusions, increased in size when compared with prior exam.   Hospitalist was asked to admit patient for further evaluation and management. PCCM was consulted and patient underwent repeat thoracentesis of both right and left. CTA chest revealed bilateral pulmonary emboli for which anticoagulation was started.  CTA also mentioned complete transit of the right upper extremity bolus via chest wall and azygous collaterals, with no opacification of the subclavian or more central vessels in the right upper extremity, and with a very narrowed, effaced appearance of the superior vena cava (series 5, image 129). 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  Vascular surgery was consulted for SVC syndrome, patient transferred from [facility] to [facility] for venography which occurred on 5/28 with  multivessel balloon angioplasty and stenting of SVC with residual 0% stenosis.   She remains hemodynamically stable post-procedure and respiratory  status is stabilized. She has been cleared for discharge from vascular surgery's perspective and cleared for safe discharge home by PT requiring 2L O2 with exertion. She will continue eliquis for PE, which she has tolerated when given here.  Assessment and Plan: Acute hypoxic respiratory failure: Due to pleural effusions, with bilateral PE's and GGOs contributing. There was mention of possible pneumonia which, in the absence of other symptoms/signs is felt to be less likely an acute issue having just completed treatment for pneumonia. No antibiotics prescribed. - Improved respiratory status, but still requiring 2L O2 with exertion on day of discharge.  - If cough or fever develops or pleural culture becomes positive, would initiate abx.    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 Evidence of some collateralization.  - Vascular surgery consulted, patient taken for multivessel balloon angioplasty (right axillary, subclavian, innominate, internal jugular veins) and SVC stent 5/28 by Dr. Donzetta Matters. Was bridged on argatroban periprocedurally given HIT hx, ok to transition back to eliquis per VVS. Creatinine stable after procedure.   R > L recurrent, symptomatic pleural effusions: ?if secondary to SVC syndrome.   - s/p R thoracentesis 1.2 L, transudate, with negative cytology during prior admission. Repeated 5/24, 1.4L clear yellow with protein level suggestive technically of exudate. Cytology without malignancy identified. - s/p L thoracentesis this admission 5/25 800cc fluid discarded, no labs.   - Recommend recheck CXR at follow up and pulmonary evaluation if this doesn't seem to improve in the coming weeks-months with SV   Bilateral pulmonary embolism: No CT evidence of R heart strain. RV normal size/function by echo.  - Continue anticoagulation with argatroban for now, convert back to DOAC once cleared by vascular surgery.    Rash reaction to contrast: Was treated with  prednisone and rash not bothersome at this time.  History of fall with SDH: Most recent CT shows resolution of SDH.  - Approaching anticoagulation cautiously.    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  - Avoid heparin and derivatives. Tolerating DOAC.  - CBC daily.    Acute on chronic HFpEF: with LVEF 72%, G1DD. BNP is low. Less likely to be primary cause of effusions, though with hypoalbuminemia, could be contributing.   - lasix '40mg'$  IV given earlier in hospitalization, will transition back to home po torsemide '20mg'$  daily. She has (multifactorial) peripheral edema, though don't want to test diuretic in setting of significant contrast administration.   Hypoalbuminemia, mild protein calorie malnutrition - Supplement protein in diet as much as reasonable    Hypokalemia: Resolved.   Hypothyroidism: Recent TSH 2.416.  - Continue synthroid   HTN:  - Continue imdur, diltiazem   Prolonged QT interval:  - Avoid provocative agents.  - Keep K, Mg replete.    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   Consultants: PCCM, Vascular surgery Procedures performed:  Dr. Donzetta Matters 10/19/2021:  1.  Ultrasound-guided cannulation right basilic vein 2.  Ultrasound-guided cannulation right common femoral vein 3.  Right upper extremity and central venogram 4.  Stent of SVC with 10 x 57 mm LD express 5.  Plain balloon angioplasty of right axillary and subclavian veins and innominate and internal jugular veins with 8 mm balloon  Disposition: Home Diet recommendation:  Cardiac diet DISCHARGE MEDICATION: Allergies as of 10/20/2021  Reactions   Iodinated Contrast Media Hives, Rash   Atorvastatin Calcium Other (See Comments)   Musculoskeletal aches Other reaction(s): Other (See Comments) Musculoskeletal aches   Doxycycline Other (See Comments)   Liver problems Other reaction(s): Other (See Comments) Liver  problems   Heparin Other (See Comments)   Causes blood clots. Other reaction(s): Other (See Comments) Causes blood clots.   Lisinopril Other (See Comments)   cough Other reaction(s): Other (See Comments) cough   Lovastatin Diarrhea   Ropinirole Hcl Nausea Only        Medication List     TAKE these medications    albuterol 108 (90 Base) MCG/ACT inhaler Commonly known as: VENTOLIN HFA Inhale 2 puffs into the lungs every 6 (six) hours as needed for wheezing or shortness of breath.   apixaban 5 MG Tabs tablet Commonly known as: Eliquis Take 2 tablets ('10mg'$ ) twice daily for 3 days, then 1 tablet ('5mg'$ ) twice daily   b complex vitamins tablet Take 2 tablets by mouth daily.   diltiazem 180 MG 24 hr capsule Commonly known as: CARDIZEM CD Take 180 mg by mouth daily.   guaiFENesin 600 MG 12 hr tablet Commonly known as: MUCINEX Take 1 tablet (600 mg total) by mouth 2 (two) times daily.   hydrALAZINE 50 MG tablet Commonly known as: APRESOLINE Take 1 tablet (50 mg total) by mouth 3 (three) times daily.   isosorbide mononitrate 30 MG 24 hr tablet Commonly known as: IMDUR Take 1 tablet (30 mg total) by mouth daily.   levothyroxine 75 MCG tablet Commonly known as: SYNTHROID Take 75 mcg by mouth daily before breakfast.   Magnesium Malate 1250 (141.7 Mg) MG Tabs Take 1,250 mg by mouth. Two tablets daily.   Neupro 1 MG/24HR Pt24 Generic drug: Rotigotine Place 1 patch (1 mg total) onto the skin daily.   potassium chloride 10 MEQ tablet Commonly known as: KLOR-CON M Take 10 mEq by mouth daily.   pregabalin 100 MG capsule Commonly known as: LYRICA TAKE 2 CAPSULES AT BEDTIME. MAY TAKE AN EXTRA ONE IF NEEDED.   sodium chloride 1 g tablet Take 1 g by mouth 3 (three) times daily.   torsemide 20 MG tablet Commonly known as: DEMADEX Take 20 mg by mouth daily.   UNABLE TO FIND Take 500 mg by mouth daily. Med Name: Beet Root   vitamin C 1000 MG tablet Take 1,000 mg by  mouth 2 (two) times a day.   YUMVS BEET ROOT-TART CHERRY PO Take by mouth.        Follow-up Information     Vyas, Dhruv B, MD. Schedule an appointment as soon as possible for a visit in 1 week(s).   Specialty: Internal Medicine Contact information: [place] [address] [phone]         Waynetta Sandy, MD. Schedule an appointment as soon as possible for a visit in 1 month(s).   Specialties: Vascular Surgery, Cardiology Contact information: [address] [place] [address] [phone]                Discharge Exam: BP (!) 112/92   Pulse 80   Temp 98.7 F (37.1 C) (Oral)   Resp (!) 23   Ht 5' 4.02" (1.626 m)   Wt 60.8 kg   SpO2 99%   BMI 23.00 kg/m   Elderly female in no distress sitting in chair Clear, diminished at bases without wheezes or crackles. Regular with 1+ pitting edema Alert, oriented, no focal  deficits.  Access sites are without active bleeding. Across her back is innumerable pinpoint erythematous nonpalpable macules without warmth, tenderness or edema. No hives. No wounds.   Condition at discharge: stable  The results of significant diagnostics from this hospitalization (including imaging, microbiology, ancillary and laboratory) are listed below for reference.   Imaging Studies: DG Chest 1 View  Result Date: 10/16/2021 CLINICAL DATA:  Post thoracentesis EXAM: CHEST  1 VIEW COMPARISON:  Exam at 1331 hours compared to 0836 hours FINDINGS: Decreased LEFT pleural effusion and basilar atelectasis post thoracentesis. No pneumothorax. Stable RIGHT basilar opacity and small but pleural effusion. Questionable residual infiltrate versus nodular opacity in RIGHT upper lobe. Atherosclerotic calcification aorta. Heart size stable. IMPRESSION: No pneumothorax following LEFT thoracentesis. Remainder of exam unchanged from earlier study. Electronically Signed   By: Lavonia Dana M.D.   On: 10/16/2021 14:08   DG Chest 1 View  Result  Date: 10/15/2021 CLINICAL DATA:  Post RIGHT thoracentesis EXAM: CHEST  1 VIEW COMPARISON:  10/14/2021 FINDINGS: Stable heart size mediastinal contours. Atherosclerotic calcification aorta. Moderate LEFT pleural effusion and basilar atelectasis, slightly increased. Decreased RIGHT pleural effusion and basilar atelectasis post thoracentesis. Focal opacity mid lung question residual atelectasis in RIGHT upper lobe, which demonstrates significantly improved aeration. No pneumothorax. IMPRESSION: No pneumothorax following RIGHT thoracentesis. Improved aeration of RIGHT lung with residual basilar and mid lung atelectasis. Slight increase in LEFT pleural effusion and LEFT basilar atelectasis. Electronically Signed   By: Lavonia Dana M.D.   On: 10/15/2021 11:25   DG Chest 1 View  Result Date: 09/30/2021 CLINICAL DATA:  RIGHT pleural effusion post thoracentesis EXAM: CHEST  1 VIEW COMPARISON:  Expiratory AP chest radiograph compared to earlier study of 09/30/2021 FINDINGS: Enlargement of cardiac silhouette. Mediastinal contours and pulmonary vascularity normal. Small bibasilar effusions and atelectasis, decreased on RIGHT since previous exam. No pneumothorax. Underlying abnormalities in the lower lungs not excluded. Osseous structures unremarkable. IMPRESSION: No pneumothorax following RIGHT thoracentesis. Electronically Signed   By: Lavonia Dana M.D.   On: 09/30/2021 15:38   DG Chest 1 View  Result Date: 09/27/2021 CLINICAL DATA:  Syncopal episode EXAM: CHEST  1 VIEW COMPARISON:  Chest x-ray dated July 30, 2021 FINDINGS: Visualized cardiac and mediastinal contours are within normal limits. Small bilateral pleural effusions. Bibasilar opacities. No evidence of pneumothorax. IMPRESSION: 1. Small bilateral pleural effusions. 2. Bibasilar opacities which are likely due to atelectasis, aspiration or infection could appear similar. Electronically Signed   By: Yetta Glassman M.D.   On: 09/27/2021 16:48   DG Chest 2  View  Result Date: 10/16/2021 CLINICAL DATA:  Shortness of breath. EXAM: CHEST - 2 VIEW COMPARISON:  Oct 15, 2021. FINDINGS: Stable moderate left pleural effusion is noted with associated left lower lobe pneumonia or atelectasis. Significantly increased right perihilar opacity is noted most consistent with pneumonia. Stable mild right pleural effusion is noted with associated right basilar atelectasis or infiltrate. Stable cardiomediastinal silhouette. Bony thorax is unremarkable. IMPRESSION: Significantly increased right perihilar opacity is noted concerning for worsening pneumonia. Stable bilateral pleural effusions are noted with associated bibasilar atelectasis or infiltrates, left greater than right. Electronically Signed   By: Marijo Conception M.D.   On: 10/16/2021 09:11   CT HEAD WO CONTRAST (5MM)  Result Date: 10/17/2021 CLINICAL DATA:  Subdural hematoma. EXAM: CT HEAD WITHOUT CONTRAST TECHNIQUE: Contiguous axial images were obtained from the base of the skull through the vertex without intravenous contrast. RADIATION DOSE REDUCTION: This exam was performed according to the  departmental dose-optimization program which includes automated exposure control, adjustment of the mA and/or kV according to patient size and/or use of iterative reconstruction technique. COMPARISON:  Sep 28, 2021. FINDINGS: Brain: No evidence of acute infarction, hemorrhage, hydrocephalus, extra-axial collection or mass lesion/mass effect. Vascular: No hyperdense vessel or unexpected calcification. Skull: Normal. Negative for fracture or focal lesion. Sinuses/Orbits: No acute finding. Other: None. IMPRESSION: No acute intracranial abnormality seen. Right para falcine subdural hematoma noted on prior exam has resolved. Electronically Signed   By: Marijo Conception M.D.   On: 10/17/2021 14:16   CT HEAD WO CONTRAST (5MM)  Result Date: 09/28/2021 CLINICAL DATA:  Follow-up subdural hematoma. EXAM: CT HEAD WITHOUT CONTRAST TECHNIQUE:  Contiguous axial images were obtained from the base of the skull through the vertex without intravenous contrast. RADIATION DOSE REDUCTION: This exam was performed according to the departmental dose-optimization program which includes automated exposure control, adjustment of the mA and/or kV according to patient size and/or use of iterative reconstruction technique. COMPARISON:  CT scan of the head Sep 27, 2021 FINDINGS: Brain: The known subdural hematoma along the falx persists measuring 3 mm on today's study and the previous study. No new subdural, epidural, or subarachnoid hemorrhage. Ventricles and sulci are stable. No blood in the ventricles. Cerebellum, brainstem, and basal cisterns are stable. No mass effect or midline shift. No acute cortical ischemia or infarct.1 Vascular: No hyperdense vessel or unexpected calcification. Skull: Normal. Negative for fracture or focal lesion. Sinuses/Orbits: No acute finding. Other: None. IMPRESSION: 1. No significant interval change in the subdural hematoma along the right side of the falx measuring 3 mm in thickness without mass effect or midline shift. No other acute interval changes. Electronically Signed   By: Dorise Bullion III M.D.   On: 09/28/2021 09:46   CT Head Wo Contrast  Result Date: 09/27/2021 CLINICAL DATA:  Syncopal episode. Fall. Blunt head trauma. Headache. EXAM: CT HEAD WITHOUT CONTRAST TECHNIQUE: Contiguous axial images were obtained from the base of the skull through the vertex without intravenous contrast. RADIATION DOSE REDUCTION: This exam was performed according to the departmental dose-optimization program which includes automated exposure control, adjustment of the mA and/or kV according to patient size and/or use of iterative reconstruction technique. COMPARISON:  07/30/2021 FINDINGS: Brain: Mild acute subdural hematoma is seen along the falx measuring 3 mm in thickness. No evidence of intraparenchymal hemorrhage, acute infarction,  hydrocephalus, or mass lesion/mass effect. Mild diffuse cerebral atrophy and chronic small vessel disease again noted Vascular:  No hyperdense vessel or other acute findings. Skull: No evidence of fracture or other significant bone abnormality. Sinuses/Orbits:  No acute findings. Other: None. IMPRESSION: Mild acute subdural hematoma along the falx measuring 3 mm in thickness. No evidence of mass effect or herniation. No evidence of skull fracture. Mild cerebral atrophy and chronic small vessel disease. Critical Value/emergent results were called by telephone at the time of interpretation on 09/27/2021 at 4:58 pm to provider JOSEPH ZAMMIT , who verbally acknowledged these results. Electronically Signed   By: Marlaine Hind M.D.   On: 09/27/2021 17:00   CT Chest Wo Contrast  Result Date: 09/27/2021 CLINICAL DATA:  Syncope.  Interstitial lung disease EXAM: CT CHEST WITHOUT CONTRAST TECHNIQUE: Multidetector CT imaging of the chest was performed following the standard protocol without IV contrast. RADIATION DOSE REDUCTION: This exam was performed according to the departmental dose-optimization program which includes automated exposure control, adjustment of the mA and/or kV according to patient size and/or use of iterative reconstruction technique.  COMPARISON:  Same day chest x-ray.  CT 11/02/2008 FINDINGS: Cardiovascular: Heart size is within normal limits. Small pericardial effusion. Thoracic aorta is nonaneurysmal. Atherosclerotic calcifications of the aorta and coronary arteries. Diminutive/atretic appearance of the SVC is unchanged from 2010 and may be congenital or acquired. Central pulmonary vasculature is nondilated. Mediastinum/Nodes: No axillary or mediastinal lymphadenopathy. Evaluation of the hilar structures is limited in the absence of intravenous contrast. Thyroid, trachea, and esophagus demonstrate no significant findings. Lungs/Pleura: Moderate right and small left pleural effusions with associated  compressive bibasilar atelectasis/consolidation. Airspace consolidation with air bronchograms in the right middle lobe. No pneumothorax. Upper Abdomen: No acute findings within the included upper abdomen. Musculoskeletal: No chest wall mass or suspicious bone lesions identified. IMPRESSION: 1. Moderate right and small left pleural effusions with associated bibasilar atelectasis/consolidation. Airspace consolidation with air bronchograms in the right middle lobe, suspicious for pneumonia. 2. Small pericardial effusion. 3. Aortic and coronary artery atherosclerosis (ICD10-I70.0). Electronically Signed   By: Davina Poke D.O.   On: 09/27/2021 18:14   CT Angio Chest Pulmonary Embolism (PE) W or WO Contrast  Addendum Date: 10/17/2021   ADDENDUM REPORT: 10/17/2021 13:16 ADDENDUM: Addendum is made for comment on patency of the superior vena cava; as originally reported there is evidently complete transit of the right upper extremity bolus via chest wall and azygous collaterals, with no opacification of the subclavian or more central vessels in the right upper extremity, and with a very narrowed, effaced appearance of the superior vena cava (series 5, image 129). 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 Additionally, comment is made on pulmonary embolus identified and reported; relatively small burden of embolus is present in segmental to subsegmental vessels of the upper lobes and possibly within distal subsegmental vessels in the lower lobes. There is no more central embolus identified. Findings discussed by telephone with Dr. Elsworth Soho. Electronically Signed   By: Delanna Ahmadi M.D.   On: 10/17/2021 13:16   Result Date: 10/17/2021 CLINICAL DATA:  Pleural effusion, known or suspected (Ped [id number]). Bilateral pleural effusions, shortness of breath. Question pulmonary embolus. EXAM: CT ANGIOGRAPHY CHEST WITH CONTRAST TECHNIQUE:  Multidetector CT imaging of the chest was performed using the standard protocol during bolus administration of intravenous contrast. Multiplanar CT image reconstructions and MIPs were obtained to evaluate the vascular anatomy. RADIATION DOSE REDUCTION: This exam was performed according to the departmental dose-optimization program which includes automated exposure control, adjustment of the mA and/or kV according to patient size and/or use of iterative reconstruction technique. CONTRAST:  187m OMNIPAQUE IOHEXOL 350 MG/ML SOLN. Patient received 13 hour premedication for contrast allergy. No immediate complications. COMPARISON:  09/27/2021 FINDINGS: Cardiovascular: There are bilateral pulmonary emboli in the upper lobes bilaterally. No evidence of right heart strain. Heart is mildly enlarged. Aorta normal caliber. Scattered coronary artery and aortic calcifications. Extensive collateral venous channels in the right chest wall with non opacification of the right subclavian vein, likely related to chronic stenosis or occlusion. Mediastinum/Nodes: No mediastinal, hilar, or axillary adenopathy. Trachea and esophagus are unremarkable. Thyroid unremarkable. Lungs/Pleura: Moderate to large bilateral pleural effusions, right greater than left, stable since prior study. Compressive atelectasis in the lower lobes. Areas of consolidation in the inferior and posterior right upper lobe concerning for pneumonia. Patchy bilateral upper lobe ground-glass opacities, likely pneumonia. Upper Abdomen: No acute findings Musculoskeletal: No acute bony abnormality. Review of the MIP images confirms the above findings. IMPRESSION: Bilateral upper lobe pulmonary  emboli. No evidence of right heart strain. Moderate to large bilateral pleural effusions, right greater than left with compressive atelectasis in the lower lobes, stable since prior study. Consolidation in the inferior and posterior right upper lobe with areas of patchy ground-glass  opacities in both upper lobes. Findings most compatible with pneumonia. Mild cardiomegaly. Aortic Atherosclerosis (ICD10-I70.0). These results will be called to the ordering clinician or representative by the Radiologist Assistant, and communication documented in the PACS or [employer]. Electronically Signed: By: Rolm Baptise M.D. On: 10/17/2021 03:21   DG Chest Left Decubitus  Result Date: 10/14/2021 CLINICAL DATA:  Shortness of breath and pleural effusion. EXAM: CHEST - LEFT DECUBITUS COMPARISON:  10/14/2021 at 4 p.m. FINDINGS: The left pleural effusion is primarily free-flowing, collecting dependently on the left lateral decubitus projection. The right pleural effusion configuration is likewise substantially changed, and likely primarily free-flowing. Atherosclerotic calcification of the aortic arch. Low lung volumes are present, causing crowding of the pulmonary vasculature. Cardiac borders moderately obscured. IMPRESSION: 1. Free-flowing left pleural effusion. Primarily free-flowing right pleural effusion. 2.  Aortic Atherosclerosis (ICD10-I70.0). 3. Low lung volumes. Electronically Signed   By: Van Clines M.D.   On: 10/14/2021 18:30   US Venous Img Lower Bilateral (DVT)  Result Date: 10/17/2021 CLINICAL DATA:  Pulmonary embolism. EXAM: BILATERAL LOWER EXTREMITY VENOUS DOPPLER ULTRASOUND TECHNIQUE: Gray-scale sonography with graded compression, as well as color Doppler and duplex ultrasound were performed to evaluate the lower extremity deep venous systems from the level of the common femoral vein and including the common femoral, femoral, profunda femoral, popliteal and calf veins including the posterior tibial, peroneal and gastrocnemius veins when visible. The superficial great saphenous vein was also interrogated. Spectral Doppler was utilized to evaluate flow at rest and with distal augmentation maneuvers in the common femoral, femoral and popliteal veins. COMPARISON:  None Available.  FINDINGS: RIGHT LOWER EXTREMITY Common Femoral Vein: No evidence of thrombus. Normal compressibility, respiratory phasicity and response to augmentation. Saphenofemoral Junction: No evidence of thrombus. Normal compressibility and flow on color Doppler imaging. Profunda Femoral Vein: No evidence of thrombus. Normal compressibility and flow on color Doppler imaging. Femoral Vein: Proximal and mid segments are patent. There is thrombus in the distal thigh segment of the right femoral vein. Popliteal Vein: No evidence of thrombus. Normal compressibility, respiratory phasicity and response to augmentation. Calf Veins: No evidence of thrombus. Normal compressibility and flow on color Doppler imaging. Superficial Great Saphenous Vein: No evidence of thrombus. Normal compressibility. Venous Reflux:  None. Other Findings: Thrombus also identified in a right gastrocnemius vein. No evidence of superficial thrombophlebitis or abnormal fluid collection. LEFT LOWER EXTREMITY Common Femoral Vein: No evidence of thrombus. Normal compressibility, respiratory phasicity and response to augmentation. Saphenofemoral Junction: No evidence of thrombus. Normal compressibility and flow on color Doppler imaging. Profunda Femoral Vein: No evidence of thrombus. Normal compressibility and flow on color Doppler imaging. Femoral Vein: No evidence of thrombus. Normal compressibility, respiratory phasicity and response to augmentation. Popliteal Vein: No evidence of thrombus. Normal compressibility, respiratory phasicity and response to augmentation. Calf Veins: Thrombus visualized in left posterior tibial and peroneal veins which appears occlusive. Superficial Great Saphenous Vein: No evidence of thrombus. Normal compressibility. Venous Reflux:  None. Other Findings: No evidence of superficial thrombophlebitis or abnormal fluid collection. IMPRESSION: Deep vein thrombus identified in the distal right femoral vein, right gastrocnemius vein and  left posterior tibial and peroneal veins. Electronically Signed   By: Aletta Edouard M.D.   On: 10/17/2021 15:06  DG Chest Portable 1 View  Result Date: 10/14/2021 CLINICAL DATA:  Shortness of breath EXAM: PORTABLE CHEST 1 VIEW COMPARISON:  Chest x-ray dated Oct 01, 2021 FINDINGS: Visualized cardiac and mediastinal contours are unchanged. Mild airspace opacities of the lower left lung, likely atelectasis. No evidence of pneumothorax. Moderate right-greater-than-left pleural effusions, increased in size when compared with prior exam. IMPRESSION: Moderate right-greater-than-left pleural effusions, increased in size when compared with prior exam. Electronically Signed   By: Yetta Glassman M.D.   On: 10/14/2021 16:08   DG Chest Port 1 View  Result Date: 10/01/2021 CLINICAL DATA:  Pleural effusion EXAM: PORTABLE CHEST 1 VIEW COMPARISON:  Multiple priors including most recent chest radiograph Sep 30, 2021 FINDINGS: Cardiac silhouette is partially obscured but appears enlarged and unchanged. Aortic atherosclerosis. Similar size of the small bilateral pleural effusion with adjacent airspace consolidations. Similar nodular consolidation in the right lung base with new nodular consolidation in the left lung base. No visible pneumothorax. The visualized skeletal structures are unchanged. IMPRESSION: Similar size of small bilateral pleural effusions with adjacent airspace consolidations. Electronically Signed   By: Dahlia Bailiff M.D.   On: 10/01/2021 08:20   DG Chest Port 1 View  Result Date: 09/30/2021 CLINICAL DATA:  Shortness of breath EXAM: PORTABLE CHEST 1 VIEW COMPARISON:  Previous studies including the examination of 09/27/2021 FINDINGS: Transverse diameter of heart is increased. There is increased haziness in the right lung suggesting layering of moderate to large right pleural effusion. There is small left pleural effusion. There are no signs of alveolar pulmonary edema. Evaluation of right lung and left  lower lung fields for infiltrates is limited by the effusions. There is no pneumothorax. IMPRESSION: Bilateral pleural effusions, more so on the right side with interval worsening. Evaluation of lung fields for infiltrates is limited by the effusions. There are no signs of alveolar pulmonary edema. Electronically Signed   By: Elmer Picker M.D.   On: 09/30/2021 13:46   ECHOCARDIOGRAM COMPLETE  Result Date: 10/17/2021    ECHOCARDIOGRAM REPORT   Patient Name:   KYRIE BUN Date of Exam: 10/17/2021 Medical Rec #:  [id number]     Height:       64.0 in Accession #:    [id number]    Weight:       131.4 lb Date of Birth:  [birth year]     BSA:          1.636 m Patient Age:    76 years      BP:           118/92 mmHg Patient Gender: F             HR:           76 bpm. Exam Location:  [facility] Procedure: 2D Echo, Cardiac Doppler and Color Doppler Indications:    Pulmonary Embolus I26.09  History:        Patient has prior history of Echocardiogram examinations, most                 recent 10/15/2021. Risk Factors:Hypertension.  Sonographer:    Bernadene Person RDCS Referring Phys: [id number] ASIA B [place]  1. Left ventricular ejection fraction, by estimation, is 55 to 60%. Left ventricular ejection fraction by 2D MOD biplane is 57.4 %. The left ventricle has normal function. The left ventricle has no regional wall motion abnormalities. Left ventricular diastolic parameters are consistent with Grade I diastolic dysfunction (impaired relaxation).  2. Right ventricular systolic function  is normal. The right ventricular size is normal. There is normal pulmonary artery systolic pressure. The estimated right ventricular systolic pressure is 35.3 mmHg.  3. The pericardial effusion is circumferential. There is no evidence of cardiac tamponade.  4. The mitral valve is grossly normal. Trivial mitral valve regurgitation.  5. The aortic valve is tricuspid. Aortic valve regurgitation is not visualized.  6. The  inferior vena cava is normal in size with greater than 50% respiratory variability, suggesting right atrial pressure of 3 mmHg. Comparison(s): Changes from prior study are noted. 10/15/2021: LVEF 70-75%, normal RV function, small pericardial effusion anterior to the RV. FINDINGS  Left Ventricle: Left ventricular ejection fraction, by estimation, is 55 to 60%. Left ventricular ejection fraction by 2D MOD biplane is 57.4 %. The left ventricle has normal function. The left ventricle has no regional wall motion abnormalities. The left ventricular internal cavity size was normal in size. There is no left ventricular hypertrophy. Left ventricular diastolic parameters are consistent with Grade I diastolic dysfunction (impaired relaxation). Indeterminate filling pressures. Right Ventricle: The right ventricular size is normal. No increase in right ventricular wall thickness. Right ventricular systolic function is normal. There is normal pulmonary artery systolic pressure. The tricuspid regurgitant velocity is 1.99 m/s, and  with an assumed right atrial pressure of 3 mmHg, the estimated right ventricular systolic pressure is 29.9 mmHg. Left Atrium: Left atrial size was normal in size. Right Atrium: Right atrial size was normal in size. Pericardium: Trivial pericardial effusion is present. The pericardial effusion is circumferential. There is no evidence of cardiac tamponade. Mitral Valve: The mitral valve is grossly normal. Trivial mitral valve regurgitation. Tricuspid Valve: The tricuspid valve is grossly normal. Tricuspid valve regurgitation is trivial. Aortic Valve: The aortic valve is tricuspid. Aortic valve regurgitation is not visualized. Pulmonic Valve: The pulmonic valve was normal in structure. Pulmonic valve regurgitation is not visualized. Aorta: The aortic root and ascending aorta are structurally normal, with no evidence of dilitation. Venous: The inferior vena cava is normal in size with greater than 50%  respiratory variability, suggesting right atrial pressure of 3 mmHg. IAS/Shunts: No atrial level shunt detected by color flow Doppler.  LEFT VENTRICLE PLAX 2D                        Biplane EF (MOD) LVIDd:         3.70 cm         LV Biplane EF:   Left LVIDs:         2.50 cm                          ventricular LV PW:         0.90 cm                          ejection LV IVS:        0.70 cm                          fraction by LVOT diam:     2.00 cm                          2D MOD LV SV:         58  biplane is LV SV Index:   35                               57.4 %. LVOT Area:     3.14 cm                                Diastology                                LV e' medial:    6.49 cm/s LV Volumes (MOD)               LV E/e' medial:  10.2 LV vol d, MOD    68.3 ml       LV e' lateral:   9.08 cm/s A2C:                           LV E/e' lateral: 7.3 LV vol d, MOD    64.2 ml A4C: LV vol s, MOD    30.9 ml A2C: LV vol s, MOD    26.0 ml A4C: LV SV MOD A2C:   37.4 ml LV SV MOD A4C:   64.2 ml LV SV MOD BP:    38.3 ml RIGHT VENTRICLE RV S prime:     10.20 cm/s TAPSE (M-mode): 1.7 cm LEFT ATRIUM             Index        RIGHT ATRIUM           Index LA diam:        3.00 cm 1.83 cm/m   RA Area:     10.10 cm LA Vol (A2C):   23.9 ml 14.60 ml/m  RA Volume:   21.00 ml  12.83 ml/m LA Vol (A4C):   23.8 ml 14.54 ml/m LA Biplane Vol: 24.6 ml 15.03 ml/m  AORTIC VALVE LVOT Vmax:   96.00 cm/s LVOT Vmean:  60.800 cm/s LVOT VTI:    0.184 m  AORTA Ao Root diam: 3.30 cm Ao Asc diam:  3.30 cm MITRAL VALVE               TRICUSPID VALVE MV Area (PHT): 2.79 cm    TR Peak grad:   15.8 mmHg MV Decel Time: 272 msec    TR Vmax:        199.00 cm/s MV E velocity: 66.40 cm/s MV A velocity: 84.80 cm/s  SHUNTS MV E/A ratio:  0.78        Systemic VTI:  0.18 m                            Systemic Diam: 2.00 cm Lyman Bishop MD Electronically signed by Lyman Bishop MD Signature Date/Time: 10/17/2021/4:37:57 PM    Final     ECHOCARDIOGRAM COMPLETE  Result Date: 09/28/2021    ECHOCARDIOGRAM REPORT   Patient Name:   SHAQUAN PUERTA Date of Exam: 09/28/2021 Medical Rec #:  [id number]     Height:       64.0 in Accession #:    [id number]    Weight:       137.0 lb Date of Birth:  March 25, 1946     BSA:  1.666 m Patient Age:    69 years      BP:           180/75 mmHg Patient Gender: F             HR:           84 bpm. Exam Location:  [facility] Procedure: 2D Echo, 3D Echo, Cardiac Doppler and Color Doppler Indications:    R55 Syncope  History:        Patient has prior history of Echocardiogram examinations, most                 recent 09/19/2008. Signs/Symptoms:Syncope, Altered Mental Status,                 Dyspnea and Shortness of Breath. Subdural hematoma.  Sonographer:    Roseanna Rainbow RDCS Referring Phys: [phone] JACOB J STINSON  Sonographer Comments: Suboptimal parasternal window and Technically difficult study due to poor echo windows. IMPRESSIONS  1. Left ventricular ejection fraction by 3D volume is 72 %. The left ventricle has hyperdynamic function. The left ventricle has no regional wall motion abnormalities. Left ventricular diastolic parameters are consistent with Grade I diastolic dysfunction (impaired relaxation).  2. Right ventricular systolic function is normal. The right ventricular size is normal.  3. A small pericardial effusion is present. The pericardial effusion is anterior to the right ventricle. Large pleural effusion in the left lateral region.  4. The mitral valve is normal in structure. Trivial mitral valve regurgitation. No evidence of mitral stenosis.  5. The aortic valve is grossly normal. There is mild calcification of the aortic valve. Aortic valve regurgitation is trivial. No aortic stenosis is present.  6. The inferior vena cava is normal in size with greater than 50% respiratory variability, suggesting right atrial pressure of 3 mmHg. FINDINGS  Left Ventricle: Left ventricular ejection fraction by 3D volume is  72 %. The left ventricle has hyperdynamic function. The left ventricle has no regional wall motion abnormalities. The left ventricular internal cavity size was normal in size. There is no left ventricular hypertrophy. Left ventricular diastolic parameters are consistent with Grade I diastolic dysfunction (impaired relaxation). Right Ventricle: The right ventricular size is normal. No increase in right ventricular wall thickness. Right ventricular systolic function is normal. Left Atrium: Left atrial size was normal in size. Right Atrium: Right atrial size was normal in size. Pericardium: A small pericardial effusion is present. The pericardial effusion is anterior to the right ventricle. Presence of epicardial fat layer. Mitral Valve: The mitral valve is normal in structure. Trivial mitral valve regurgitation. No evidence of mitral valve stenosis. MV peak gradient, 7.2 mmHg. The mean mitral valve gradient is 3.0 mmHg. Tricuspid Valve: The tricuspid valve is normal in structure. Tricuspid valve regurgitation is trivial. No evidence of tricuspid stenosis. Aortic Valve: The aortic valve is grossly normal. There is mild calcification of the aortic valve. Aortic valve regurgitation is trivial. No aortic stenosis is present. Pulmonic Valve: The pulmonic valve was grossly normal. Pulmonic valve regurgitation is not visualized. No evidence of pulmonic stenosis. Aorta: The aortic root is normal in size and structure. Venous: The inferior vena cava is normal in size with greater than 50% respiratory variability, suggesting right atrial pressure of 3 mmHg. IAS/Shunts: No atrial level shunt detected by color flow Doppler. Additional Comments: There is a large pleural effusion in the left lateral region.  LEFT VENTRICLE PLAX 2D LVIDd:         4.90 cm  Diastology LVIDs:         2.60 cm         LV e' medial:    6.96 cm/s LV PW:         1.00 cm         LV E/e' medial:  11.8 LV IVS:        1.00 cm         LV e' lateral:    6.42 cm/s LVOT diam:     2.05 cm         LV E/e' lateral: 12.8 LV SV:         78 LV SV Index:   47 LVOT Area:     3.30 cm        3D Volume EF                                LV 3D EF:    Left                                             ventricul LV Volumes (MOD)                            ar LV vol d, MOD    49.2 ml                    ejection A2C:                                        fraction LV vol d, MOD    58.5 ml                    by 3D A4C:                                        volume is LV vol s, MOD    17.5 ml                    72 %. A2C: LV vol s, MOD    12.0 ml A4C:                           3D Volume EF: LV SV MOD A2C:   31.7 ml       3D EF:        72 % LV SV MOD A4C:   58.5 ml       LV EDV:       78 ml LV SV MOD BP:    39.1 ml       LV ESV:       22 ml                                LV SV:        57 ml RIGHT VENTRICLE             IVC RV S prime:     14.80  cm/s  IVC diam: 2.10 cm TAPSE (M-mode): 2.1 cm LEFT ATRIUM           Index        RIGHT ATRIUM          Index LA diam:      2.00 cm 1.20 cm/m   RA Area:     9.47 cm LA Vol (A2C): 21.7 ml 13.03 ml/m  RA Volume:   20.10 ml 12.07 ml/m LA Vol (A4C): 22.3 ml 13.39 ml/m  AORTIC VALVE             PULMONIC VALVE LVOT Vmax:   134.00 cm/s PR End Diast Vel: 1.61 msec LVOT Vmean:  82.000 cm/s LVOT VTI:    0.235 m  AORTA Ao Root diam: 3.00 cm Ao Asc diam:  3.30 cm MITRAL VALVE MV Area (PHT): 3.60 cm     SHUNTS MV Area VTI:   2.80 cm     Systemic VTI:  0.24 m MV Peak grad:  7.2 mmHg     Systemic Diam: 2.05 cm MV Mean grad:  3.0 mmHg MV Vmax:       1.34 m/s MV Vmean:      80.2 cm/s MV Decel Time: 211 msec MV E velocity: 82.00 cm/s MV A velocity: 137.00 cm/s MV E/A ratio:  0.60 Cherlynn Kaiser MD Electronically signed by Cherlynn Kaiser MD Signature Date/Time: 09/28/2021/2:17:20 PM    Final    ECHOCARDIOGRAM LIMITED  Result Date: 10/15/2021    ECHOCARDIOGRAM LIMITED REPORT   Patient Name:   HESSIE VARONE Date of Exam: 10/15/2021 Medical Rec #:  [id number]      Height:       64.0 in Accession #:    [id number]    Weight:       152.3 lb Date of Birth:  05-12-1946     BSA:          1.743 m Patient Age:    58 years      BP:           149/73 mmHg Patient Gender: F             HR:           88 bpm. Exam Location:  [facility] Procedure: Limited Echo Indications:    CHF  History:        Patient has prior history of Echocardiogram examinations, most                 recent 09/28/2021. CHF, Abnormal ECG, Signs/Symptoms:Syncope; Risk                 Factors:Hypertension. Bilateral pleural effusions.  Sonographer:    Wenda Low Referring Phys: [id number] OLADAPO ADEFESO IMPRESSIONS  1. Limited study.  2. Left ventricular ejection fraction, by estimation, is 70 to 75%. The left ventricle has hyperdynamic function. The left ventricle has no regional wall motion abnormalities.  3. Right ventricular systolic function is normal. The right ventricular size is normal.  4. Bilateral pleural effusions noted, appears large on the left. A small pericardial effusion is present. The pericardial effusion is anterior to the right ventricle. There is no evidence of cardiac tamponade.  5. The aortic valve is tricuspid.  6. The inferior vena cava is normal in size with greater than 50% respiratory variability, suggesting right atrial pressure of 3 mmHg. Comparison(s): No significant change from prior study. Prior images reviewed side by side. FINDINGS  Left Ventricle: Left ventricular ejection fraction, by estimation,  is 70 to 75%. The left ventricle has hyperdynamic function. The left ventricle has no regional wall motion abnormalities. The left ventricular internal cavity size was normal in size. There is borderline left ventricular hypertrophy. Right Ventricle: The right ventricular size is normal. No increase in right ventricular wall thickness. Right ventricular systolic function is normal. Left Atrium: Left atrial size was normal in size. Right Atrium: Right atrial size was normal in size.  Pericardium: Bilateral pleural effusions noted, appears large on the left. A small pericardial effusion is present. The pericardial effusion is anterior to the right ventricle. There is no evidence of cardiac tamponade. Aortic Valve: The aortic valve is tricuspid. Pulmonic Valve: The pulmonic valve was grossly normal. Aorta: The aortic root is normal in size and structure. Venous: The inferior vena cava is normal in size with greater than 50% respiratory variability, suggesting right atrial pressure of 3 mmHg. IAS/Shunts: The interatrial septum was not assessed. LEFT VENTRICLE PLAX 2D LVIDd:         3.50 cm LVIDs:         2.00 cm LV PW:         1.10 cm LV IVS:        0.90 cm LVOT diam:     2.00 cm LVOT Area:     3.14 cm  LV Volumes (MOD) LV vol d, MOD A2C: 29.7 ml LV vol d, MOD A4C: 50.5 ml LV vol s, MOD A2C: 9.6 ml LV vol s, MOD A4C: 12.7 ml LV SV MOD A2C:     20.1 ml LV SV MOD A4C:     50.5 ml LV SV MOD BP:      29.3 ml LEFT ATRIUM         Index LA diam:    3.10 cm 1.78 cm/m   AORTA Ao Root diam: 3.10 cm  SHUNTS Systemic Diam: 2.00 cm Rozann Lesches MD Electronically signed by Rozann Lesches MD Signature Date/Time: 10/15/2021/10:49:40 AM    Final    HYBRID OR IMAGING ([place])  Result Date: 10/19/2021 There is no interpretation for this exam.  This order is for images obtained during a surgical procedure.  Please See "Surgeries" Tab for more information regarding the procedure.   US THORACENTESIS ASP PLEURAL SPACE W/IMG GUIDE  Result Date: 10/16/2021 INDICATION: Hypoxemia, LEFT pleural effusion, post RIGHT thoracentesis yesterday EXAM: ULTRASOUND GUIDED THERAPEUTIC LEFT THORACENTESIS MEDICATIONS: None. COMPLICATIONS: None immediate. PROCEDURE: An ultrasound guided thoracentesis was thoroughly discussed with the patient and questions answered. The benefits, risks, alternatives and complications were also discussed. The patient understands and wishes to proceed with the procedure. Written consent was  obtained. Ultrasound was performed to localize and mark an adequate pocket of fluid in the LEFT chest. The area was then prepped and draped in the normal sterile fashion. 1% Lidocaine was used for local anesthesia. Under ultrasound guidance a 5 [place] Yueh catheter was introduced. Thoracentesis was performed. The catheter was removed and a dressing applied. FINDINGS: A total of approximately 800 mL of yellow fluid was removed. IMPRESSION: Successful ultrasound guided LEFT thoracentesis yielding 800 mL of pleural fluid. Electronically Signed   By: Lavonia Dana M.D.   On: 10/16/2021 14:11   US THORACENTESIS ASP PLEURAL SPACE W/IMG GUIDE  Result Date: 10/15/2021 INDICATION: Patient with a history of heart failure and recent pneumonia with recurrent right pleural effusion. Interventional Radiology asked to perform a therapeutic and diagnostic thoracentesis. EXAM: ULTRASOUND GUIDED THORACENTESIS MEDICATIONS: None. COMPLICATIONS: None immediate. PROCEDURE: An ultrasound guided thoracentesis was  thoroughly discussed with the patient and questions answered. The benefits, risks, alternatives and complications were also discussed. The patient understands and wishes to proceed with the procedure. Written consent was obtained. Ultrasound was performed to localize and mark an adequate pocket of fluid in the right chest. The area was then prepped and draped in the normal sterile fashion. 1% Lidocaine was used for local anesthesia. Under ultrasound guidance a 6 Fr Safe-T-Centesis catheter was introduced. Thoracentesis was performed. The catheter was removed and a dressing applied. FINDINGS: A total of approximately 1.4 L of clear yellow fluid was removed. Samples were sent to the laboratory as requested by the clinical team. IMPRESSION: Successful ultrasound guided right thoracentesis yielding 1.4 L of pleural fluid. Read by: Soyla Dryer, NP Electronically Signed   By: Lavonia Dana M.D.   On: 10/15/2021 11:23   US  THORACENTESIS ASP PLEURAL SPACE W/IMG GUIDE  Result Date: 09/30/2021 INDICATION: Shortness of breath, RIGHT PLEURAL EFFUSION EXAM: ULTRASOUND GUIDED DIAGNOSTIC AND THERAPEUTIC RIGHT THORACENTESIS MEDICATIONS: None. COMPLICATIONS: None immediate. PROCEDURE: An ultrasound guided thoracentesis was thoroughly discussed with the patient and questions answered. The benefits, risks, alternatives and complications were also discussed. The patient understands and wishes to proceed with the procedure. Written consent was obtained. Ultrasound was performed to localize and mark an adequate pocket of fluid in the RIGHT chest. The area was then prepped and draped in the normal sterile fashion. 1% Lidocaine was used for local anesthesia. Under ultrasound guidance a 8 French thoracentesis catheter was introduced. Thoracentesis was performed. The catheter was removed and a dressing applied. FINDINGS: A total of approximately 1.3 L of yellow RIGHT pleural fluid was removed. Samples were sent to the laboratory as requested by the clinical team. IMPRESSION: Successful ultrasound guided RIGHT thoracentesis yielding 1.3 L of pleural fluid. Electronically Signed   By: Lavonia Dana M.D.   On: 09/30/2021 15:36    Microbiology: Results for orders placed or performed during the hospital encounter of 10/14/21  Culture, body fluid w Gram Stain-bottle     Status: None   Collection Time: 10/15/21 10:45 AM   Specimen: Pleura  Result Value Ref Range Status   Specimen Description PLEURAL  Final   Special Requests   Final    BOTTLES DRAWN AEROBIC AND ANAEROBIC Blood Culture adequate volume   Culture   Final    NO GROWTH 5 DAYS Performed at [facility], [address] [place], [place] [address]    Report Status 10/20/2021 FINAL  Final  Gram stain     Status: None   Collection Time: 10/15/21 10:45 AM   Specimen: Pleura  Result Value Ref Range Status   Specimen Description PLEURAL  Final   Special Requests NONE  Final   Gram Stain    Final    CYTOSPIN SMEAR NO ORGANISMS SEEN WBC PRESENT,BOTH PMN AND MONONUCLEAR Performed at [facility], [address] [place], [place] [address]    Report Status 10/15/2021 FINAL  Final  MRSA Next Gen by PCR, Nasal     Status: None   Collection Time: 10/17/21  6:18 PM   Specimen: Nasal Mucosa; Nasal Swab  Result Value Ref Range Status   MRSA by PCR Next Gen NOT DETECTED NOT DETECTED Final    Comment: (NOTE) The GeneXpert MRSA Assay (FDA approved for NASAL specimens only), is one component of a comprehensive MRSA colonization surveillance program. It is not intended to diagnose MRSA infection nor to guide or monitor treatment for MRSA infections. Test performance is not  FDA approved in patients less than 22 years old. Performed at [facility], [place] [address] [place], [place] [address]     Labs: CBC: Recent Labs  Lab 10/14/21 1845 10/15/21 0452 10/16/21 0458 10/17/21 0513 10/18/21 0047 10/19/21 0105 10/20/21 0028  WBC 10.1   < > 10.5 10.5 13.5* 9.6 10.1  NEUTROABS 8.8*  --   --   --   --   --   --   HGB 10.6*   < > 8.0* 9.1* 8.8* 9.2* 8.8*  HCT 31.7*   < > 24.1* 27.3* 26.6* 27.3* 27.8*  MCV 94.3   < > 95.3 95.1 94.7 93.8 97.9  PLT 238   < > 163 170 212 193 196   < > = values in this interval not displayed.   Basic Metabolic Panel: Recent Labs  Lab 10/15/21 0452 10/16/21 0458 10/17/21 0513 10/18/21 0047 10/19/21 0105 10/20/21 0028  NA 141 139 138 137 138 139  K 3.8 2.8* 3.1* 3.3* 3.9 4.5  CL 108 101 99 99 103 107  CO2 '27 30 28 28 26 27  '$ GLUCOSE 94 98 136* 145* 130* 130*  BUN 21 18 26* 42* 43* 29*  CREATININE 0.95 1.04* 1.02* 1.32* 1.11* 1.01*  CALCIUM 7.9* 7.4* 7.5* 7.6* 7.7* 7.3*  MG 1.6*  --  2.2  --  1.9  --   PHOS 2.4*  --   --   --   --   --    Liver Function Tests: Recent Labs  Lab 10/14/21 1845 10/15/21 0452  AST 27 21  ALT 32 27  ALKPHOS 153* 134*  BILITOT 0.6 0.5  PROT 6.3* 5.5*  ALBUMIN 3.3* 2.9*   CBG: No results for  input(s): GLUCAP in the last 168 hours.  Discharge time spent: greater than 30 minutes.  Signed: Patrecia Pour, MD [facility] 10/20/2021

## 2021-10-20 NOTE — TOC Benefit Eligibility Note (Signed)
Patient Research scientist (life sciences) completed.     The patient is currently admitted and upon discharge could be taking ELIQUIS 5 MG.   The current 30 day co-pay is, $40.   The patient is insured through [employer].

## 2021-10-20 NOTE — Progress Notes (Addendum)
[place] for argatroban Indication: pulmonary embolus  Allergies  Allergen Reactions   Iodinated Contrast Media Hives and Rash   Atorvastatin Calcium Other (See Comments)    Musculoskeletal aches Other reaction(s): Other (See Comments) Musculoskeletal aches   Doxycycline Other (See Comments)    Liver problems Other reaction(s): Other (See Comments) Liver problems   Heparin Other (See Comments)    Causes blood clots. Other reaction(s): Other (See Comments) Causes blood clots.   Lisinopril Other (See Comments)    cough Other reaction(s): Other (See Comments) cough   Lovastatin Diarrhea   Ropinirole Hcl Nausea Only    Patient Measurements: Height: 5' 4.02" (162.6 cm) Weight: 60.8 kg (134 lb 0.6 oz) IBW/kg (Calculated) : 54.74  Vital Signs: Temp: 98.1 F (36.7 C) (05/29 0500) Temp Source: Oral (05/29 0500) BP: 163/83 (05/29 0500) Pulse Rate: 70 (05/29 0300)  Labs: Recent Labs    10/18/21 0047 10/18/21 1131 10/18/21 1630 10/19/21 0105 10/19/21 0402 10/20/21 0028  HGB 8.8*  --   --  9.2*  --  8.8*  HCT 26.6*  --   --  27.3*  --  27.8*  PLT 212  --   --  193  --  196  APTT  --    < > 57*  --  54* 50*  CREATININE 1.32*  --   --  1.11*  --  1.01*   < > = values in this interval not displayed.     Estimated Creatinine Clearance: 41.6 mL/min (A) (by C-G formula based on SCr of 1.01 mg/dL (H)).   Medical History: Past Medical History:  Diagnosis Date   Dysphasia    Endometrial cancer ([place])    Heel spur    Heparin induced thrombocytopenia (HCC)    Hypercholesteremia    Hypertension    Hypothyroidism    Migraine headache    Osteopenia    RLS (restless legs syndrome)    SVC syndrome    Thyroid disease     Medications:   argatroban 0.5 mcg/kg/min (10/20/21 2536)    Assessment: 76yo female was admitted earlier this month for LOC>fall that caused mild SDH, CT reveals bilateral upper lobe PE >> started on  anticoagulation with argatroban (confirmed h/o HIT) switched to Eliquis, now tx'd to [facility] for vascular consult/procedure >> to hold Eliquis and resume argatroban; will dose conservatively given recent SDH (SDH resolved on CT).  Pt is s/p cannulation and angioplasty of subclavian veins 5/28. Patient has a hematoma at site without any change from initial formation per RN. No other s/x of bleeding.   Her aPTT is therapeutic at 50 today. Hgb stable at 8.8, plt wnl.  Goal of Therapy:  aPTT 50-70 seconds Monitor platelets by anticoagulation protocol: Yes   Plan:  Continue argatroban 0.5 mcg/kg/min. Daily aPTT  F/u resume apixaban  ADDENDUM: Consulted to switch patient back to Eliquis. Will give shorter duration given 4 days of therapeutic argatroban Start Eliquis 10 mg BID x 3 days then 5 mg BID Stop argatroban when first dose Eliquis given  Cathrine Muster, PharmD PGY2 Cardiology Pharmacy Resident 10/20/2021  7:14 AM  Please check [email or web address] for unit-specific pharmacy phone numbers.

## 2021-10-20 NOTE — Progress Notes (Addendum)
Progress Note    10/20/2021 8:03 AM 1 Day Post-Op  Subjective:  says she feels good this morning  Afebrile HR 64'Q-03'K NSR 742'V systolic 956% 3OV5IE  Gtts:   argatroban  Vitals:   10/20/21 0713 10/20/21 0729  BP: (!) 179/85 (!) 161/75  Pulse: 77   Resp: 15 (!) 21  Temp: 98.6 F (37 C)   SpO2: 95%     Physical Exam: General:  sitting in chair in no distress Lungs:  non labored Incisions:  right arm with small hematoma but very soft. Extremities:  palpable right radial pulse   CBC    Component Value Date/Time   WBC 10.1 10/20/2021 0028   RBC 2.84 (L) 10/20/2021 0028   HGB 8.8 (L) 10/20/2021 0028   HGB 13.8 12/15/2018 1520   HCT 27.8 (L) 10/20/2021 0028   HCT 40.7 12/15/2018 1520   PLT 196 10/20/2021 0028   PLT 222 12/15/2018 1520   MCV 97.9 10/20/2021 0028   MCV 97 12/15/2018 1520   MCH 31.0 10/20/2021 0028   MCHC 31.7 10/20/2021 0028   RDW 14.3 10/20/2021 0028   RDW 13.1 12/15/2018 1520   LYMPHSABS 0.6 (L) 10/14/2021 1845   LYMPHSABS 1.6 12/15/2018 1520   MONOABS 0.5 10/14/2021 1845   EOSABS 0.1 10/14/2021 1845   EOSABS 0.1 12/15/2018 1520   BASOSABS 0.0 10/14/2021 1845   BASOSABS 0.1 12/15/2018 1520    BMET    Component Value Date/Time   NA 139 10/20/2021 0028   NA 132 (L) 12/22/2018 1436   K 4.5 10/20/2021 0028   CL 107 10/20/2021 0028   CO2 27 10/20/2021 0028   GLUCOSE 130 (H) 10/20/2021 0028   BUN 29 (H) 10/20/2021 0028   BUN 13 12/22/2018 1436   CREATININE 1.01 (H) 10/20/2021 0028   CALCIUM 7.3 (L) 10/20/2021 0028   GFRNONAA 58 (L) 10/20/2021 0028   GFRAA 91 12/22/2018 1436    INR    Component Value Date/Time   INR 3.8 (H) 11/10/2008 0650     Intake/Output Summary (Last 24 hours) at 10/20/2021 0803 Last data filed at 10/20/2021 3329 Gross per 24 hour  Intake 854.5 ml  Output --  Net 854.5 ml     Assessment/Plan:  76 y.o. female is s/p:  1.  Ultrasound-guided cannulation right basilic vein 2.  Ultrasound-guided  cannulation right common femoral vein 3.  Right upper extremity and central venogram 4.  Stent of SVC with 10 x 57 mm LD express 5.  Plain balloon angioplasty of right axillary and subclavian veins and innominate and internal jugular veins with 8 mm balloon  1 Day Post-Op   -pt doing well this morning.  She does have a small hematoma right arm but this is very soft.   -ok to ambulate from vascular standpoint.  Pt has order for PT eval and treat-feel she will benefit from PT.  -renal function improved from yesterday with creatinine of 1.01 -DVT prophylaxis:  argagtroban  -Dr. Donzetta Matters will be by later this morning.    Leontine Locket, PA-C [facility] [phone] 10/20/2021 8:03 AM  I have independently interviewed and examined patient and agree with PA assessment and plan above.  She is okay for conversion to Eliquis today.  I discussed with the family that her SVC stenting yesterday will not lead to immediate resolution of her recurrent pleural effusions but hopefully will prevent any further need for admission.  The only way to adequately evaluate the stent as an outpatient would  be with CT of the chest but we cannot get good inference from right upper extremity and right neck venous duplex which I will get in my office in 4 to 6 weeks.  She is okay for discharge from a vascular standpoint.  Ioane Bhola C. Donzetta Matters, MD [facility] [place] Office: [phone] Pager: [phone]

## 2021-10-20 NOTE — Evaluation (Signed)
Occupational Therapy Evaluation Patient Details Name: [name] MRN: [id number] DOB: [birth year] Today's Date: 10/20/2021   History of Present Illness Patient is a 76 y/o female who presents on 5/23 with SOB, LE swelling. CXR- Right>left pleural effusions. Admitted with acute respiratory failure with hypoxia secondary to bil pleural effusions s/p bil thoracenteses 5/24 and 5/25. Also with new Bil PEs 5/26. s/p central venogram, balloon angioplasty and insertion of subclavian stent 5/28. Recently admitted 5/6-5/11 secondary to CAP and pleural effusions. 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   Clinical Impression   Pt is functioning at up to min assist for ADLs and min guard assist for mobility without a device. She is eager to return home with her supportive husband. All DME needs at home are met. Pt educated in breathing techniques and energy conservation and verbalized understanding. Will defer further OT to [facility] to address IADLs.     Recommendations for follow up therapy are one component of a multi-disciplinary discharge planning process, led by the attending physician.  Recommendations may be updated based on patient status, additional functional criteria and insurance authorization.   Follow Up Recommendations  Home health OT    Assistance Recommended at Discharge Intermittent Supervision/Assistance  Patient can return home with the following A little help with walking and/or transfers;A little help with bathing/dressing/bathroom;Assistance with cooking/housework;Assist for transportation;Help with stairs or ramp for entrance    Functional Status Assessment  Patient has had a recent decline in their functional status and demonstrates the ability to make significant improvements in function in a reasonable and predictable amount of time.  Equipment Recommendations  None recommended by OT     Recommendations for Other Services       Precautions / Restrictions Precautions Precautions: Fall Precaution Comments: watch 02 Restrictions Weight Bearing Restrictions: No      Mobility Bed Mobility               General bed mobility comments: Up in chair upon arrival.    Transfers Overall transfer level: Needs assistance Equipment used: None Transfers: Sit to/from Stand Sit to Stand: Min guard           General transfer comment: Min guard for safety. Stood from chair x1, slow to rise.      Balance Overall balance assessment: Needs assistance   Sitting balance-Leahy Scale: Good     Standing balance support: During functional activity Standing balance-Leahy Scale: Fair Standing balance comment: Min guard for safety.                           ADL either performed or assessed with clinical judgement   ADL Overall ADL's : Needs assistance/impaired Eating/Feeding: Set up;Sitting   Grooming: Min guard;Standing   Upper Body Bathing: Minimal assistance;Sitting   Lower Body Bathing: Min guard;Sit to/from stand   Upper Body Dressing : Set up;Sitting   Lower Body Dressing: Set up;Sit to/from stand Lower Body Dressing Details (indicate cue type and reason): does not wear socks, uses slip on shoes Toilet Transfer: Min guard;Ambulation   Toileting- Clothing Manipulation and Hygiene: Min guard;Sit to/from stand       Functional mobility during ADLs: Min guard       Vision Baseline Vision/History: 1 Wears glasses Ability to See in Adequate Light: 0 Adequate Patient Visual Report: No change from baseline       Perception     Praxis  Pertinent Vitals/Pain Pain Assessment Pain Assessment: No/denies pain     Hand Dominance Right   Extremity/Trunk Assessment Upper Extremity Assessment Upper Extremity Assessment: LUE deficits/detail LUE Deficits / Details: hx of amputation   Lower Extremity Assessment Lower Extremity  Assessment: Defer to PT evaluation   Cervical / Trunk Assessment Cervical / Trunk Assessment: Normal   Communication Communication Communication: No difficulties   Cognition Arousal/Alertness: Awake/alert Behavior During Therapy: WFL for tasks assessed/performed Overall Cognitive Status: Within Functional Limits for tasks assessed                                       General Comments  Spouse present during session. Sp02 dropped to 66% on RA with activity, donned 2L and able to maintain Sp02 >91%    Exercises     Shoulder Instructions      Home Living Family/patient expects to be discharged to:: Private residence Living Arrangements: Spouse/significant other Available Help at Discharge: Family;Available 24 hours/day Type of Home: House Home Access: Ramped entrance     Home Layout: One level     Bathroom Shower/Tub: Other (comment) (walk in tub)   Bathroom Toilet: Handicapped height     Home Equipment: BSC/3in1;Shower seat - built in   Additional Comments: 02      Prior Functioning/Environment Prior Level of Function : Needs assist             Mobility Comments: furniture walks, getting a transport chair for the community ADLs Comments: husband assists in and out of walk in tub        OT Problem List:        OT Treatment/Interventions:      OT Goals(Current goals can be found in the care plan section)    OT Frequency:      Co-evaluation              AM-PAC OT "6 Clicks" Daily Activity     Outcome Measure Help from another person eating meals?: A Little Help from another person taking care of personal grooming?: A Little Help from another person toileting, which includes using toliet, bedpan, or urinal?: A Little Help from another person bathing (including washing, rinsing, drying)?: A Little Help from another person to put on and taking off regular upper body clothing?: A Little Help from another person to put on and taking  off regular lower body clothing?: A Little 6 Click Score: 18   End of Session Equipment Utilized During Treatment: Oxygen (2L)  Activity Tolerance: Patient tolerated treatment well Patient left: in chair;with call bell/phone within reach;with family/visitor present  OT Visit Diagnosis: Unsteadiness on feet (R26.81);Muscle weakness (generalized) (M62.81);Other (comment) (decreased activity tolerance)                Time: 1325-1350 OT Time Calculation (min): 25 min Charges:  OT General Charges $OT Visit: 1 Visit OT Evaluation $OT Eval Moderate Complexity: 1 Mod OT Treatments $Self Care/Home Management : 8-22 mins  Nestor Lewandowsky, OTR/L Acute Rehabilitation Services Pager: [phone] Office: [phone]  Malka So 10/20/2021, 3:07 PM

## 2021-10-20 NOTE — Evaluation (Signed)
Physical Therapy Evaluation Patient Details Name: [name] MRN: [id number] DOB: Mar 29, 1946 Today's Date: 10/20/2021  History of Present Illness  Patient is a 76 y/o female who presents on 5/23 with SOB, LE swelling. CXR- Right>left pleural effusions. Admitted with acute respiratory failure with hypoxia secondary to bil pleural effusions s/p bil thoracenteses 5/24 and 5/25. Also with new Bil PEs 5/26. s/p central venogram, balloon angioplasty and insertion of subclavian stent 5/28. Recently admitted 5/6-5/11 secondary to CAP and pleural effusions. 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  Clinical Impression  Patient presents with generalized weakness, impaired balance, dyspnea on exertion, decreased cardiopulmonary endurance and impaired mobility s/p above. Pt lives at home with spouse and reports needing some assist with ADLs at baseline, furniture walker at home. Today, pt requires Min guard-Min A for mobility without use of DME. Noted to have 2-3/4 DOE towards end of walk. Sp02 dropped to 66% on RA with activity, donned 2L and able to maintain Sp02 >91% with activity. Will need 02 at home, which pt already has. Discussed importance of resuming HHPT services and will likely transition to OPPT for higher level balance, strengthening and endurance training. Will follow acutely to maximize independence and mobility prior to return home.       Recommendations for follow up therapy are one component of a multi-disciplinary discharge planning process, led by the attending physician.  Recommendations may be updated based on patient status, additional functional criteria and insurance authorization.  Follow Up Recommendations Home health PT (continued HHPT)    Assistance Recommended at Discharge Intermittent Supervision/Assistance  Patient can return home with the following  A little help with walking and/or  transfers;A little help with bathing/dressing/bathroom;Help with stairs or ramp for entrance;Assistance with cooking/housework;Assist for transportation    Equipment Recommendations None recommended by PT  Recommendations for Other Services       Functional Status Assessment Patient has had a recent decline in their functional status and demonstrates the ability to make significant improvements in function in a reasonable and predictable amount of time.     Precautions / Restrictions Precautions Precautions: Fall;Other (comment) Precaution Comments: watch 02 Restrictions Weight Bearing Restrictions: No      Mobility  Bed Mobility               General bed mobility comments: Up in chair upon PT arrival.    Transfers Overall transfer level: Needs assistance Equipment used: None Transfers: Sit to/from Stand Sit to Stand: Min guard           General transfer comment: Min guard for safety. Stood from chair x1, slow to rise.    Ambulation/Gait Ambulation/Gait assistance: Min guard Gait Distance (Feet): 300 Feet Assistive device: None Gait Pattern/deviations: Decreased step length - right, Decreased step length - left, Decreased stride length, Drifts right/left, Leaning posteriorly Gait velocity: varies Gait velocity interpretation: <1.31 ft/sec, indicative of household ambulator   General Gait Details: Mildly unsteady gait with posterior bias, decreased right arm swing and occasional drifting to left/right needing Min guard for safety. Sp02 dropped to 66% on RA, donned 2L and able to maintain Sp02 >91% with activity. 2/4 DOE.  Stairs            Wheelchair Mobility    Modified Rankin (Stroke Patients Only)       Balance Overall balance assessment: Needs assistance Sitting-balance support: Feet supported, No upper extremity supported Sitting balance-Leahy Scale: Good     Standing balance  support: During functional activity Standing balance-Leahy  Scale: Fair Standing balance comment: Min guard for safety.                             Pertinent Vitals/Pain Pain Assessment Pain Assessment: No/denies pain    Home Living Family/patient expects to be discharged to:: (P) Private residence Living Arrangements: (P) Spouse/significant other Available Help at Discharge: Family;Available 24 hours/day Type of Home: (P) House Home Access: (P) Ramped entrance       Home Layout: (P) One level Home Equipment: (P) Shower seat;BSC/3in1;Shower seat - built in Additional Comments: (P) no DME at home    Prior Function Prior Level of Function : (P) Needs assist       Physical Assist : (P) Mobility (physical);ADLs (physical) Mobility (physical): Bed mobility;Transfers;Gait;Stairs   Mobility Comments: (P) household ambulator usually leaning on furniture, walls ADLs Comments: (P) assisted by family     Hand Dominance   Dominant Hand: (P) Right    Extremity/Trunk Assessment   Upper Extremity Assessment Upper Extremity Assessment: Defer to OT evaluation;LUE deficits/detail LUE Deficits / Details: hx of amputation    Lower Extremity Assessment Lower Extremity Assessment: Generalized weakness    Cervical / Trunk Assessment Cervical / Trunk Assessment: Normal  Communication   Communication: (P) No difficulties  Cognition Arousal/Alertness: Awake/alert Behavior During Therapy: WFL for tasks assessed/performed Overall Cognitive Status: Within Functional Limits for tasks assessed                                          General Comments General comments (skin integrity, edema, etc.): Spouse present during session. Sp02 dropped to 66% on RA with activity, donned 2L and able to maintain Sp02 >91%    Exercises     Assessment/Plan    PT Assessment Patient needs continued PT services  PT Problem List Decreased strength;Decreased activity tolerance;Decreased balance;Decreased mobility;Cardiopulmonary  status limiting activity       PT Treatment Interventions DME instruction;Gait training;Stair training;Functional mobility training;Therapeutic activities;Therapeutic exercise;Patient/family education;Balance training    PT Goals (Current goals can be found in the Care Plan section)  Acute Rehab PT Goals Patient Stated Goal: to go home and improve strength, be able to walk outside again PT Goal Formulation: With patient Time For Goal Achievement: 11/03/21 Potential to Achieve Goals: Good    Frequency Min 3X/week     Co-evaluation               AM-PAC PT "6 Clicks" Mobility  Outcome Measure Help needed turning from your back to your side while in a flat bed without using bedrails?: None Help needed moving from lying on your back to sitting on the side of a flat bed without using bedrails?: None Help needed moving to and from a bed to a chair (including a wheelchair)?: A Little Help needed standing up from a chair using your arms (e.g., wheelchair or bedside chair)?: A Little Help needed to walk in hospital room?: A Little Help needed climbing 3-5 steps with a railing? : A Little 6 Click Score: 20    End of Session Equipment Utilized During Treatment: Oxygen;Gait belt Activity Tolerance: Patient tolerated treatment well Patient left: in chair;with call bell/phone within reach;with family/visitor present Nurse Communication: Mobility status;Other (comment) (02 sats) PT Visit Diagnosis: Unsteadiness on feet (R26.81);Other abnormalities of gait and mobility (R26.89);Muscle weakness (  generalized) (M62.81)    Time: 1022-1050 PT Time Calculation (min) (ACUTE ONLY): 28 min   Charges:   PT Evaluation $PT Eval Moderate Complexity: 1 Mod PT Treatments $Gait Training: 8-22 mins        Zettie Cooley, DPT Acute Rehabilitation Services Secure chat preferred Office [place] 10/20/2021, 12:36 PM

## 2021-10-20 NOTE — Plan of Care (Signed)

## 2021-10-20 NOTE — TOC Transition Note (Signed)
Transition of Care [facility]) - CM/SW Discharge Note   Patient Details  Name: [name] MRN: [id number] Date of Birth: 30-Jul-1945  Transition of [facility]) CM/SW Contact:  Angelita Ingles, RN Phone [phone]  10/20/2021, 2:52 PM   Clinical Narrative:    [facility] consulted for patient discharging home on Eliquis. Cm provided patient with 30 day free trial card for Eliquis. Copay is $40. Patient is currently active with [facility] for Home health. Marjory Lies with [facility] has been updated to make aware of discharge. Patient is active with [facility] for O2. Patient has portable tank for discharge. No other needs noted at this time. TOC will sign off.      Barriers to Discharge: Continued Medical Work up   Patient Goals and CMS Choice Patient states their goals for this hospitalization and ongoing recovery are:: return home CMS Medicare.gov Compare Post Acute Care list provided to:: Patient Choice offered to / list presented to : Patient  Discharge Placement                       Discharge Plan and Services In-house Referral: Clinical Social Work   Post Acute Care Choice: Home Health                               Social Determinants of Health (SDOH) Interventions     Readmission Risk Interventions    10/17/2021   12:19 PM 10/02/2021    3:25 PM  Readmission Risk Prevention Plan  Transportation Screening Complete Complete  PCP or Specialist Appt within 5-7 Days  Complete  Home Care Screening  Complete  Medication Review (RN CM)  Complete  HRI or Home Care Consult Complete   Social Work Consult for [place] Planning/Counseling Complete   Palliative Care Screening Not Applicable   Medication Review Press photographer) Complete

## 2021-10-21 ENCOUNTER — Encounter (HOSPITAL_COMMUNITY): Payer: Self-pay | Admitting: Vascular Surgery

## 2021-10-23 DIAGNOSIS — I10 Essential (primary) hypertension: Secondary | ICD-10-CM | POA: Diagnosis not present

## 2021-10-23 DIAGNOSIS — Z299 Encounter for prophylactic measures, unspecified: Secondary | ICD-10-CM | POA: Diagnosis not present

## 2021-10-23 DIAGNOSIS — I82210 Acute embolism and thrombosis of superior vena cava: Secondary | ICD-10-CM | POA: Diagnosis not present

## 2021-10-23 DIAGNOSIS — Z86718 Personal history of other venous thrombosis and embolism: Secondary | ICD-10-CM | POA: Diagnosis not present

## 2021-10-23 DIAGNOSIS — R21 Rash and other nonspecific skin eruption: Secondary | ICD-10-CM | POA: Diagnosis not present

## 2021-10-23 DIAGNOSIS — E7800 Pure hypercholesterolemia, unspecified: Secondary | ICD-10-CM | POA: Diagnosis not present

## 2021-10-28 ENCOUNTER — Telehealth: Payer: Self-pay

## 2021-10-30 LAB — MISC LABCORP TEST (SEND OUT)

## 2021-11-05 ENCOUNTER — Other Ambulatory Visit: Payer: Self-pay

## 2021-11-05 DIAGNOSIS — I2699 Other pulmonary embolism without acute cor pulmonale: Secondary | ICD-10-CM

## 2021-11-05 MED ORDER — APIXABAN 5 MG PO TABS: ORAL_TABLET | ORAL | 0 refills | Status: DC

## 2021-11-05 NOTE — Telephone Encounter (Signed)
Pt called requesting a refill on Eliquis to be sent to [facility] for mail order.  Returned pt's call, two identifiers used. Informed pt's refill request was sent for 1 month supply with no refills, so she will need to request more refills if Dr Donzetta Matters continues the medication at her f/u appt. Pt confirmed understanding.

## 2021-11-15 ENCOUNTER — Other Ambulatory Visit: Payer: Self-pay

## 2021-11-15 DIAGNOSIS — S40021S Contusion of right upper arm, sequela: Secondary | ICD-10-CM

## 2021-11-20 DIAGNOSIS — I10 Essential (primary) hypertension: Secondary | ICD-10-CM | POA: Diagnosis not present

## 2021-11-26 NOTE — Telephone Encounter (Signed)
Appt scheduled

## 2021-12-03 ENCOUNTER — Other Ambulatory Visit: Payer: Self-pay | Admitting: Vascular Surgery

## 2021-12-09 ENCOUNTER — Encounter (HOSPITAL_COMMUNITY): Payer: Medicare PPO

## 2021-12-09 ENCOUNTER — Encounter: Payer: Medicare PPO | Admitting: Vascular Surgery

## 2021-12-10 ENCOUNTER — Ambulatory Visit (INDEPENDENT_AMBULATORY_CARE_PROVIDER_SITE_OTHER): Payer: Medicare PPO | Admitting: Physician Assistant

## 2021-12-10 ENCOUNTER — Ambulatory Visit (HOSPITAL_COMMUNITY)
Admission: RE | Admit: 2021-12-10 | Discharge: 2021-12-10 | Disposition: A | Discharge status: Home / Self Care | Payer: Medicare PPO | Source: Ambulatory Visit | Attending: Vascular Surgery | Admitting: Vascular Surgery

## 2021-12-10 VITALS — BP 129/68 | HR 83 | Temp 97.9°F | Resp 20 | Ht 64.0 in | Wt 133.6 lb

## 2021-12-10 DIAGNOSIS — S40021S Contusion of right upper arm, sequela: Secondary | ICD-10-CM | POA: Diagnosis not present

## 2021-12-10 DIAGNOSIS — Z95828 Presence of other vascular implants and grafts: Secondary | ICD-10-CM

## 2021-12-10 DIAGNOSIS — I871 Compression of vein: Secondary | ICD-10-CM

## 2021-12-10 DIAGNOSIS — T82898A Other specified complication of vascular prosthetic devices, implants and grafts, initial encounter: Secondary | ICD-10-CM | POA: Diagnosis not present

## 2021-12-10 DIAGNOSIS — I82C21 Chronic embolism and thrombosis of right internal jugular vein: Secondary | ICD-10-CM | POA: Diagnosis not present

## 2021-12-10 NOTE — Progress Notes (Signed)
POST OPERATIVE OFFICE NOTE    CC:  F/u for surgery  HPI:  This is a 76 y.o. female who is s/p  Procedure Performed: 1.  Ultrasound-guided cannulation right basilic vein 2.  Ultrasound-guided cannulation right common femoral vein 3.  Right upper extremity and central venogram 4.  Stent of SVC with 10 x 57 mm LD express 5.  Plain balloon angioplasty of right axillary and subclavian veins and innominate and internal jugular veins with 8 mm balloon for occluded SVC.  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    Pt returns today for follow up.  Pt states she is pleased with her outcome.     Allergies  Allergen Reactions   Iodinated Contrast Media Hives and Rash   Atorvastatin Calcium Other (See Comments)    Musculoskeletal aches Other reaction(s): Other (See Comments) Musculoskeletal aches   Doxycycline Other (See Comments)    Liver problems Other reaction(s): Other (See Comments) Liver problems   Heparin Other (See Comments)    Causes blood clots. Other reaction(s): Other (See Comments) Causes blood clots.   Lisinopril Other (See Comments)    cough Other reaction(s): Other (See Comments) cough   Lovastatin Diarrhea   Ropinirole Hcl Nausea Only    Current Outpatient Medications  Medication Sig Dispense Refill   albuterol (VENTOLIN HFA) 108 (90 Base) MCG/ACT inhaler Inhale 2 puffs into the lungs every 6 (six) hours as needed for wheezing or shortness of breath. 8 g 2   Ascorbic Acid (VITAMIN C) 1000 MG tablet Take 1,000 mg by mouth 2 (two) times a day.     b complex vitamins tablet Take 2 tablets by mouth daily.     diltiazem (CARDIZEM CD) 180 MG 24 hr capsule Take 180 mg by mouth daily.     ELIQUIS 5 MG TABS tablet TAKE 1 TABLET TWICE DAILY 60 tablet 0   guaiFENesin (MUCINEX) 600 MG 12 hr tablet Take 1 tablet (600 mg total) by mouth 2 (two) times daily. 30 tablet 0   hydrALAZINE (APRESOLINE) 50 MG tablet Take 1 tablet (50  mg total) by mouth 3 (three) times daily. 90 tablet 1   isosorbide mononitrate (IMDUR) 30 MG 24 hr tablet Take 1 tablet (30 mg total) by mouth daily. 30 tablet 1   levothyroxine (SYNTHROID) 75 MCG tablet Take 75 mcg by mouth daily before breakfast.     Magnesium Malate 1250 (141.7 Mg) MG TABS Take 1,250 mg by mouth. Two tablets daily.     Misc Natural Products (YUMVS BEET ROOT-TART CHERRY PO) Take by mouth.     potassium chloride (KLOR-CON M) 10 MEQ tablet Take 10 mEq by mouth daily.     pregabalin (LYRICA) 100 MG capsule TAKE 2 CAPSULES AT BEDTIME. MAY TAKE AN EXTRA ONE IF NEEDED. 90 capsule 3   Rotigotine (NEUPRO) 1 MG/24HR PT24 Place 1 patch (1 mg total) onto the skin daily. 90 patch 3   sodium chloride 1 g tablet Take 1 g by mouth 3 (three) times daily.     torsemide (DEMADEX) 20 MG tablet Take 20 mg by mouth daily.     UNABLE TO FIND Take 500 mg by mouth daily. Med Name: Beet Root     No current facility-administered medications for this visit.     ROS:  See HPI  Physical Exam:   Extremity No edema in the right UE Lungs no SOB, no O2 support Extremities:  Palpable radial pulse, motor intact.  Right Findings:  +----------+------------+---------+-----------+----------+--------------+  RIGHT     CompressiblePhasicitySpontaneousProperties   Summary      +----------+------------+---------+-----------+----------+--------------+  IJV           Full       Yes       Yes                 Chronic      +----------+------------+---------+-----------+----------+--------------+  Subclavian    None       No        No               stent occluded  +----------+------------+---------+-----------+----------+--------------+  Axillary      Full       Yes       Yes                              +----------+------------+---------+-----------+----------+--------------+  Brachial      None       No        No                                +----------+------------+---------+-----------+----------+--------------+  Radial        Full       Yes       Yes                              +----------+------------+---------+-----------+----------+--------------+  Ulnar         Full       Yes       No                               +----------+------------+---------+-----------+----------+--------------+  Cephalic      Full       Yes       Yes                              +----------+------------+---------+-----------+----------+--------------+  Basilic       None       Yes       No                               +----------+------------+---------+-----------+----------+--------------+      Summary:     Right:  The right subclavian vein stent appears occluded.  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  No color flow & no compressability in the brachial vein proximal segment.       Assessment/Plan:  This is a 76 y.o. female who is s/p: 1.  Ultrasound-guided cannulation right basilic vein 2.  Ultrasound-guided cannulation right common femoral vein 3.  Right upper extremity and central venogram 4.  Stent of SVC with 10 x 57 mm LD express 5.  Plain balloon angioplasty of right axillary and subclavian veins and innominate and internal jugular veins with 8 mm balloon  The stent is not fully visible it is reported as possible occluded.  She is completely asymptomatic.  She should continue Eliquis due to her history of PE's and UE DVT for life.  This can be managed by her PCP.    If she develops recurrent symptoms she will need a CTA  of the chest.  At this point she will f/u PRN.   Roxy Horseman  [facility] [phone]   Clinic MD:  Donzetta Matters

## 2021-12-17 ENCOUNTER — Ambulatory Visit (HOSPITAL_COMMUNITY)
Admission: RE | Admit: 2021-12-17 | Discharge: 2021-12-17 | Disposition: A | Discharge status: Home / Self Care | Payer: Medicare PPO | Source: Ambulatory Visit | Attending: Pulmonary Disease | Admitting: Pulmonary Disease

## 2021-12-17 ENCOUNTER — Encounter: Payer: Self-pay | Admitting: Pulmonary Disease

## 2021-12-17 ENCOUNTER — Ambulatory Visit: Payer: Medicare PPO | Admitting: Pulmonary Disease

## 2021-12-17 VITALS — BP 134/80 | HR 68 | Temp 98.4°F | Ht 64.0 in | Wt 132.4 lb

## 2021-12-17 DIAGNOSIS — R0602 Shortness of breath: Secondary | ICD-10-CM | POA: Diagnosis not present

## 2021-12-17 DIAGNOSIS — Z86718 Personal history of other venous thrombosis and embolism: Secondary | ICD-10-CM | POA: Diagnosis not present

## 2021-12-17 DIAGNOSIS — J90 Pleural effusion, not elsewhere classified: Secondary | ICD-10-CM

## 2021-12-17 DIAGNOSIS — I2699 Other pulmonary embolism without acute cor pulmonale: Secondary | ICD-10-CM

## 2021-12-17 NOTE — Progress Notes (Signed)
   Subjective:    Patient ID: [name], female    DOB: 29-Sep-1945, 76 y.o.   MRN: [id number]  HPI  76 year old never smoker  for FU of  bilateral pleural effusions, PE & SVC syndrome   She was admitted 5/6-5/11 with syncope and fall and found to have a small subdural hematoma along the falx.  CT chest showed moderate right and small left pleural effusion with airspace consolidation and air bronchograms in the right middle lobe suspicious for pneumonia.  She underwent right thoracentesis with removal of 1.2 L of fluid, noted to be transudative.  She was treated with Rocephin/azithromycin and discharged on oral antibiotics and 2 L of oxygen.  she was readmitted 09/2021 for hypoxia, leg swelling and bilateral effusions requiring bilateral thoracenteses, PCCM consulted She was found to have bilateral lower extremity DVT/acute pulmonary   I felt that SVC syndrome was the most likely cause of her pleural effusions and shortness of breath, she was referred to vascular and underwent placement of SVC stent with angioplasty of right axillary and subclavian veins and IJ  She arrives accompanied by her husband today.  She is off oxygen and is able to walk a good distance. She does not desaturate on ambulation today. She continues to have bilateral lower extremity edema, right more than left. 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  Review of Systems neg for any significant sore throat, dysphagia, itching, sneezing, nasal congestion or excess/ purulent secretions, fever, chills, sweats, unintended wt loss, pleuritic or exertional cp, hempoptysis, orthopnea pnd or change in chronic leg swelling. Also denies presyncope, palpitations, heartburn, abdominal pain, nausea, vomiting, diarrhea or change in bowel or urinary habits, dysuria,hematuria, rash, arthralgias, visual complaints, headache, numbness weakness or ataxia.     Objective:   Physical Exam  Gen. Pleasant, well-nourished, in no distress ENT - no thrush, no pallor/icterus,no post nasal drip Neck: No JVD, no thyromegaly, no carotid bruits Lungs: no use of accessory muscles, no dullness to percussion, decreased BS on left, without rales or rhonchi  Cardiovascular: Rhythm regular, heart sounds  normal, no murmurs or gallops, no peripheral edema Musculoskeletal: No deformities, no cyanosis or clubbing        Assessment & Plan:   Acute respiratory failure with hypoxia -seems to have been related to SVC syndrome and this is resolved

## 2021-12-17 NOTE — Patient Instructions (Addendum)
  X Amb sat  X CXR to r/o effusions  X Venous duplex both leg veins to follow on blood clots

## 2021-12-17 NOTE — Assessment & Plan Note (Signed)
Much improved as denoted by improvement in hypoxia.  She will continue Eliquis. We will reassess venous duplex both legs for resolution of DVT No clear trigger apparent, she may need anticoagulation lifelong

## 2021-12-17 NOTE — Assessment & Plan Note (Signed)
Symptomatically improved. Chest x-ray obtained today and independently reviewed shows resolution of both effusions and good position of SVC stent

## 2021-12-22 ENCOUNTER — Ambulatory Visit (INDEPENDENT_AMBULATORY_CARE_PROVIDER_SITE_OTHER): Payer: Medicare PPO

## 2021-12-22 DIAGNOSIS — Z86718 Personal history of other venous thrombosis and embolism: Secondary | ICD-10-CM | POA: Diagnosis not present

## 2021-12-22 DIAGNOSIS — I10 Essential (primary) hypertension: Secondary | ICD-10-CM | POA: Diagnosis not present

## 2021-12-24 DIAGNOSIS — Z0000 Encounter for general adult medical examination without abnormal findings: Secondary | ICD-10-CM | POA: Diagnosis not present

## 2021-12-24 DIAGNOSIS — Z6823 Body mass index (BMI) 23.0-23.9, adult: Secondary | ICD-10-CM | POA: Diagnosis not present

## 2021-12-24 DIAGNOSIS — R5383 Other fatigue: Secondary | ICD-10-CM | POA: Diagnosis not present

## 2021-12-24 DIAGNOSIS — I25119 Atherosclerotic heart disease of native coronary artery with unspecified angina pectoris: Secondary | ICD-10-CM | POA: Diagnosis not present

## 2021-12-24 DIAGNOSIS — Z79899 Other long term (current) drug therapy: Secondary | ICD-10-CM | POA: Diagnosis not present

## 2021-12-24 DIAGNOSIS — I771 Stricture of artery: Secondary | ICD-10-CM | POA: Diagnosis not present

## 2021-12-24 DIAGNOSIS — E039 Hypothyroidism, unspecified: Secondary | ICD-10-CM | POA: Diagnosis not present

## 2021-12-24 DIAGNOSIS — Z7189 Other specified counseling: Secondary | ICD-10-CM | POA: Diagnosis not present

## 2021-12-24 DIAGNOSIS — E7800 Pure hypercholesterolemia, unspecified: Secondary | ICD-10-CM | POA: Diagnosis not present

## 2021-12-24 DIAGNOSIS — Z299 Encounter for prophylactic measures, unspecified: Secondary | ICD-10-CM | POA: Diagnosis not present

## 2021-12-24 DIAGNOSIS — Z1331 Encounter for screening for depression: Secondary | ICD-10-CM | POA: Diagnosis not present

## 2022-01-20 ENCOUNTER — Other Ambulatory Visit: Payer: Self-pay | Admitting: Neurology

## 2022-01-20 DIAGNOSIS — R202 Paresthesia of skin: Secondary | ICD-10-CM

## 2022-01-20 DIAGNOSIS — G2581 Restless legs syndrome: Secondary | ICD-10-CM

## 2022-01-21 DIAGNOSIS — I10 Essential (primary) hypertension: Secondary | ICD-10-CM | POA: Diagnosis not present

## 2022-01-22 NOTE — Telephone Encounter (Signed)
Verify Drug Registry For Pregabalin 100 Mg Capsule Last Filled: 12/09/2021 Quantity: 90 capsules for 30 days Last appointment: 08/13/2021 Next appointment: 02/17/2022

## 2022-02-05 ENCOUNTER — Other Ambulatory Visit: Payer: Self-pay | Admitting: Neurology

## 2022-02-05 DIAGNOSIS — G2581 Restless legs syndrome: Secondary | ICD-10-CM

## 2022-02-05 DIAGNOSIS — R202 Paresthesia of skin: Secondary | ICD-10-CM

## 2022-02-17 ENCOUNTER — Encounter: Payer: Self-pay | Admitting: Neurology

## 2022-02-17 ENCOUNTER — Ambulatory Visit: Payer: Medicare PPO | Admitting: Neurology

## 2022-02-17 DIAGNOSIS — R202 Paresthesia of skin: Secondary | ICD-10-CM

## 2022-02-17 DIAGNOSIS — G2581 Restless legs syndrome: Secondary | ICD-10-CM

## 2022-02-17 MED ORDER — PREGABALIN 75 MG PO CAPS: 75.0000 mg | ORAL_CAPSULE | Freq: Three times a day (TID) | ORAL | 5 refills | Status: DC

## 2022-02-17 MED ORDER — NEUPRO 1 MG/24HR TD PT24: 1.0000 mg | MEDICATED_PATCH | Freq: Every day | TRANSDERMAL | 3 refills | Status: DC

## 2022-02-17 NOTE — Progress Notes (Signed)
HISTORY  [name] is a 76 year old female, seen in request by her primary care physician Dr. Sherril Croon, [facility], for evaluation of restless leg, chronic insomnia, she is accompanied by her husband at today's visit on December 15, 2018.   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   She walks regularly, wearing 1 pound ankle weight,   She complains recent onset bilateral toes paresthesia  She return for electrodiagnostic study in Sept 2020,  showed evidence of mild axonal sensorimotor polyneuropathy, and moderate right carpal tunnel syndrome.   Her sleep is much improved with Lyrica 25 mg 4 tablets every night, but she still get up occasionally pacing around walking 500 steps, read books before she can go back to sleep again   Previously she has tried melatonin which did help her sleep    Laboratory evaluations showed hyponatremia with sodium level of 127, repeat test showed 133, normal CBC, vitamin D 44, ANA, copper level, ferritin was mildly elevated 185, protein electrophoresis showed no significant abnormality, A1c was 5.5, normal ESR, CPK, B12,  RPR, folic acid, C-reactive protein, TSH,  UPDATE Sept 26 2023: Hospital admission [facility] in March 2023 for acute mental status change, with sodium level of 110, was given salt tablet, also had a UTI requiring antibiotics  Hospital admission again in May 2023 for bilateral lower extremity swelling, shortness of breath,  Echocardiogram showed normal ejection fraction 55 to 60%, no regional wall abnormality Venous ultrasound of bilateral lower extremity showed deep venous thrombosis in the distal right femoral vein, right gastric anemias vein, left posterior tibial and peroneal veins,  CT angiogram chest showed bilateral upper lobe pulmonary emboli, moderate to large bilateral pleural effusion, right greater than left, with compressive atelectasis in left lower lobe,  CT head without contrast showed no acute abnormality  Laboratory evaluations showed CBC anemia hemoglobin of 8.8, BMP creatinine of 1.0, GFR of 58,  She underwent thoracentesis, peritoneal fluid total protein 3.4, glucose 99, cell count nucleated cell 1584, 31% neutrophil, 61% lymphocytes, normal TSH, PTH, calcitriol,  optimize calcium, most recent sodium 139 within normal limits  CT angiogram also demonstrated Marinaro increased appearance of the superior vena cava, this is in keeping with 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, 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, she underwent multivessel balloon angioplasty and stenting of superior vena cava residual residual 0% stenosis,  She is overall much better, ambulate without assistant, after above complicated hospital course, restless leg syndrome seems to be under better control, she is now taking Neupro patch 1 mg each day, Lyrica 100 mg 2 tablets every night, sleeps well,  Continue to have intermittent  lower extremity swelling, gets better after overnight sleep  REVIEW OF SYSTEMS: Out of a complete 14 system review of symptoms, the patient  complains only of the following symptoms, and all other reviewed systems are negative.  See HPI  ALLERGIES: Allergies  Allergen Reactions   Iodinated Contrast Media Hives and Rash   Atorvastatin Calcium Other (See Comments)    Musculoskeletal aches Other reaction(s): Other (See Comments) Musculoskeletal aches   Doxycycline Other (See Comments)    Liver problems Other reaction(s): Other (See Comments) Liver problems   Heparin Other (See Comments)    Causes blood clots. Other reaction(s): Other (See Comments) Causes blood clots.   Lisinopril Other (See Comments)    cough Other reaction(s): Other (See Comments) cough   Lovastatin Diarrhea   Ropinirole Hcl Nausea Only    HOME MEDICATIONS: Outpatient Medications Prior to Visit  Medication Sig Dispense Refill   Ascorbic Acid (VITAMIN C) 1000 MG tablet Take 1,000 mg by mouth 2 (two) times a day.     b complex vitamins tablet Take 2 tablets by mouth daily.     diltiazem (CARDIZEM CD) 180 MG 24 hr capsule Take 180 mg by mouth daily.     ELIQUIS 5 MG TABS tablet TAKE 1 TABLET TWICE DAILY 60 tablet 0   Ferrous Sulfate (IRON SUPPLEMENT PO) Take 2 tablets by mouth daily.     HORSE CHESTNUT PO Take 1 capsule by mouth daily.     hydrALAZINE (APRESOLINE) 50 MG tablet Take 1 tablet (50 mg total) by mouth 3 (three) times daily. (Patient taking differently: Take 50 mg by mouth 2 (two) times daily.) 90 tablet 1   isosorbide mononitrate (IMDUR) 30 MG 24 hr tablet Take 1 tablet (30 mg total) by mouth daily. 30 tablet 1   levothyroxine (SYNTHROID) 75 MCG tablet Take 75 mcg by mouth daily before breakfast.     Magnesium Malate 1250 (141.7 Mg) MG TABS Take 1,250 mg by mouth. Two tablets daily.     Misc Natural Products (YUMVS BEET ROOT-TART CHERRY PO) Take by mouth.     potassium chloride (KLOR-CON M) 10 MEQ tablet Take 10 mEq by mouth daily.     pregabalin (LYRICA) 100 MG capsule TAKE 2 CAPSULES AT BEDTIME--MAY TAKE AN EXTRA CAPSULE IF NEEDED. 90  capsule 0   rosuvastatin (CRESTOR) 5 MG tablet Take 1 tablet by mouth once a week.     Rotigotine (NEUPRO) 1 MG/24HR PT24 Place 1 patch (1 mg total) onto the skin daily. 90 patch 3   sodium chloride 1 g tablet Take 1 g by mouth 3 (three) times daily.     torsemide (DEMADEX) 20 MG tablet Take 20 mg by mouth daily.     albuterol (VENTOLIN HFA) 108 (90 Base) MCG/ACT inhaler Inhale 2 puffs into the lungs every 6 (six) hours as needed for wheezing or shortness of breath. (Patient not taking: Reported on 12/17/2021) 8 g 2   UNABLE TO FIND Take 500 mg by mouth daily. Med Name: Beet Root     No facility-administered medications prior to visit.    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[facility])    Hypercholesteremia    Hypertension    Hypothyroidism    Migraine headache    Osteopenia    RLS (restless legs syndrome)    SVC syndrome    Thyroid disease  PAST SURGICAL HISTORY: Past Surgical History:  Procedure Laterality Date   ABDOMINAL AORTIC ENDOVASCULAR STENT GRAFT Right 10/19/2021   Procedure: CENTRAL VENOGRAM;  Surgeon: Waynetta Sandy, MD;  Location: [facility];  Service: Vascular;  Laterality: Right;   ABDOMINAL HYSTERECTOMY     ANGIOPLASTY Right 10/19/2021   Procedure: BALLOON ANGIOPLASTY INTERNAL JUGULAR, INNOMINATE AND SUBCLAVIAN;  Surgeon: Waynetta Sandy, MD;  Location: [facility];  Service: Vascular;  Laterality: Right;   ANKLE SURGERY Right    ARM AMPUTATION AT [place]     right index finger at knuckle   INSERTION OF ILIAC STENT Right 10/19/2021   Procedure: INSERTION OF SUBCLAVIAN  STENT;  Surgeon: Waynetta Sandy, MD;  Location: [place];  Service: Vascular;  Laterality: Right;   PORT-A-CATH REMOVAL     TONSILLECTOMY      FAMILY HISTORY: Family History  Problem Relation Age of Onset   Stroke Mother    Transient ischemic  attack Father    Dementia Father    Pneumonia Father    Diabetes Sister    Rheum arthritis Sister    Brain cancer Brother    Breast cancer Maternal Grandmother    Stroke Paternal Grandmother     SOCIAL HISTORY: Social History   Socioeconomic History   Marital status: Married    Spouse name: Not on file   Number of children: 3   Years of education: college   Highest education level: Not on file  Occupational History   Occupation: Retired Pharmacist, hospital  Tobacco Use   Smoking status: Never    Passive exposure: Never   Smokeless tobacco: Never  Vaping Use   Vaping Use: Never used  Substance and Sexual Activity   Alcohol use: No   Drug use: No   Sexual activity: Not on file  Other Topics Concern   Not on file  Social History Narrative   Lives at home with husband.   Right-handed.   No caffeine use.   Social Determinants of Health   Financial Resource Strain: Not on file  Food Insecurity: Not on file  Transportation Needs: Not on file  Physical Activity: Not on file  Stress: Not on file  Social Connections: Not on file  Intimate Partner Violence: Not on file   PHYSICAL EXAM  Vitals:   02/17/22 1327  BP: 135/73  Pulse: 68  Weight: 138 lb (62.6 kg)  Height: $Remove'5\' 4"'[id number]$  (1.626 m)    Body mass index is 23.69 kg/m.  PHYSICAL EXAMNIATION:  Gen: NAD, conversant, well nourised, well groomed                     Cardiovascular: Regular rate rhythm, no peripheral edema, warm, nontender. Eyes: Conjunctivae clear without exudates or hemorrhage Neck: Supple, no carotid bruits. Pulmonary: Clear to auscultation bilaterally   NEUROLOGICAL EXAM:  MENTAL STATUS: Speech/cognition: Awake, alert oriented to history taking and casual conversation  CRANIAL NERVES: CN II: Visual fields are full to confrontation.  Pupils are round equal and briskly reactive to light. CN III, IV, VI: extraocular movement are normal. No ptosis. CN V: Facial sensation is intact to pinprick in all 3  divisions bilaterally. Corneal responses are intact.  CN VII: Face is symmetric with normal eye closure and smile. CN VIII: Hearing is normal to casual conversation CN IX, X: Palate elevates symmetrically. Phonation is normal. CN XI: Head turning and shoulder shrug are intact CN XII: Tongue is midline with normal movements and  no atrophy.  MOTOR: s/p   right index, left above elbow amputation, bilateral pitting edema to mid shin level  REFLEXES: Reflexes are 1 and symmetric at the biceps, triceps, knees, and ankles. Plantar responses are flexor.  SENSORY: Intact to light touch, pinprick, positional and vibratory sensation are intact in fingers and toes.  COORDINATION: Rapid alternating movements and fine finger movements are intact. There is no dysmetria on finger-to-nose and heel-knee-shin.    GAIT/STANCE: Posture is normal. Gait is steady    DIAGNOSTIC DATA (LABS, IMAGING, TESTING) - I reviewed patient records, labs, notes, testing and imaging myself where available.  Lab Results  Component Value Date   WBC 10.1 10/20/2021   HGB 8.8 (L) 10/20/2021   HCT 27.8 (L) 10/20/2021   MCV 97.9 10/20/2021   PLT 196 10/20/2021      Component Value Date/Time   NA 139 10/20/2021 0028   NA 132 (L) 12/22/2018 1436   K 4.5 10/20/2021 0028   CL 107 10/20/2021 0028   CO2 27 10/20/2021 0028   GLUCOSE 130 (H) 10/20/2021 0028   BUN 29 (H) 10/20/2021 0028   BUN 13 12/22/2018 1436   CREATININE 1.01 (H) 10/20/2021 0028   CALCIUM 7.3 (L) 10/20/2021 0028   PROT 5.5 (L) 10/15/2021 0452   PROT 7.1 12/15/2018 1520   ALBUMIN 2.9 (L) 10/15/2021 0452   ALBUMIN 5.0 (H) 12/15/2018 1520   AST 21 10/15/2021 0452   ALT 27 10/15/2021 0452   ALKPHOS 134 (H) 10/15/2021 0452   BILITOT 0.5 10/15/2021 0452   BILITOT 0.7 12/15/2018 1520   GFRNONAA 58 (L) 10/20/2021 0028   GFRAA 91 12/22/2018 1436   No results found for: "CHOL", "HDL", "LDLCALC", "LDLDIRECT", "TRIG", "CHOLHDL" Lab Results  Component  Value Date   HGBA1C 5.5 12/15/2018   Lab Results  Component Value Date   VITAMINB12 >2000 (H) 12/15/2018   Lab Results  Component Value Date   TSH 2.416 09/28/2021   ASSESSMENT AND PLAN 76 y.o. year old female  here with:   Restless leg syndrome  Chronic insomnia  Much improved with current treatment, neupro patch 1 mg every day  Decrease Lyrica to 75 mg tablets, up to 3 tablets every night Bilateral lower extremity deep venous thrombosis, pulmonary emboli,  Is on chronic anticoagulation Eliquis 5 mg twice a day   Return to clinic in 6 months with nurse practitioner.  Marcial Pacas, M.D. Ph.D.  [facility] [place], [place] [address] Phone: [phone] Fax:      [phone]   Total time spent reviewing the chart, obtaining history, examined patient, ordering tests, documentation, consultations and family, care coordination was 40 minutes

## 2022-02-20 DIAGNOSIS — I10 Essential (primary) hypertension: Secondary | ICD-10-CM | POA: Diagnosis not present

## 2022-02-20 DIAGNOSIS — R5383 Other fatigue: Secondary | ICD-10-CM | POA: Diagnosis not present

## 2022-03-23 DIAGNOSIS — I10 Essential (primary) hypertension: Secondary | ICD-10-CM | POA: Diagnosis not present

## 2022-04-01 ENCOUNTER — Other Ambulatory Visit: Payer: Self-pay | Admitting: Internal Medicine

## 2022-04-01 DIAGNOSIS — Z1231 Encounter for screening mammogram for malignant neoplasm of breast: Secondary | ICD-10-CM

## 2022-04-15 ENCOUNTER — Ambulatory Visit
Admission: RE | Admit: 2022-04-15 | Discharge: 2022-04-15 | Disposition: A | Discharge status: Home / Self Care | Payer: Medicare PPO | Source: Ambulatory Visit | Attending: Internal Medicine | Admitting: Internal Medicine

## 2022-04-15 DIAGNOSIS — Z1231 Encounter for screening mammogram for malignant neoplasm of breast: Secondary | ICD-10-CM

## 2022-04-22 DIAGNOSIS — I10 Essential (primary) hypertension: Secondary | ICD-10-CM | POA: Diagnosis not present

## 2022-05-22 DIAGNOSIS — I10 Essential (primary) hypertension: Secondary | ICD-10-CM | POA: Diagnosis not present

## 2022-06-22 DIAGNOSIS — I10 Essential (primary) hypertension: Secondary | ICD-10-CM | POA: Diagnosis not present

## 2022-07-01 ENCOUNTER — Encounter: Payer: Self-pay | Admitting: Pulmonary Disease

## 2022-07-01 ENCOUNTER — Ambulatory Visit: Payer: Medicare PPO | Admitting: Pulmonary Disease

## 2022-07-01 VITALS — BP 142/92 | HR 56 | Ht 64.0 in | Wt 149.2 lb

## 2022-07-01 DIAGNOSIS — I2699 Other pulmonary embolism without acute cor pulmonale: Secondary | ICD-10-CM | POA: Diagnosis not present

## 2022-07-01 DIAGNOSIS — J90 Pleural effusion, not elsewhere classified: Secondary | ICD-10-CM

## 2022-07-01 NOTE — Patient Instructions (Signed)
   Stay on apixaban

## 2022-07-01 NOTE — Progress Notes (Signed)
   Subjective:    Patient ID: [name], female    DOB: Apr 13, 1946, 77 y.o.   MRN: [id number]  HPI  77  yo never smoker  for FU of  bilateral pleural effusions, PE & SVC syndrome   She was admitted 5/6-5/11/23 with syncope and fall and found to have a small subdural hematoma along the falx.  CT chest showed moderate right and small left pleural effusion with airspace consolidation and air bronchograms in the right middle lobe suspicious for pneumonia.  She underwent right thoracentesis with removal of 1.2 L of fluid, noted to be transudative.  She was treated with Rocephin/azithromycin and discharged on oral antibiotics and 2 L of oxygen.  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  She reports mild pedal edema especially towards the end of the day.  She continues on apixaban and is tolerating well no signs of bleeding or skin bruising  Last office visit with vascular was reviewed -upper extremity ultrasound shows the right subclavian stent was occluded and right IJ also had thrombus She denies arm swelling or facial swelling       Significant tests/ events reviewed   5/9 right thoracenteses >> 1.2 L  fluid removed , transudate protein less than 3 , LDH  67, cytology showed reactive mesothelial cells , lymphocytic 5/24 right thoracentesis >> 1.4 L, protein 3.4, LDH 190 , lymphocytic 5/25 left thoracentesis >> 800 cc 5/25 CTA chest -bilateral upper lobe segmental PE, extensive collaterals consistent with SVC syndrome 10/17/21 duplex BLE >> distal right femoral vein,right gastrocnemius vein and left posterior tibial and peroneal veins. 11/2021 LE duplex >> Only minimal clot left and right calf vein all other clots have resolved    Review of Systems neg for any significant sore throat, dysphagia, itching, sneezing, nasal congestion or excess/ purulent secretions, fever, chills, sweats, unintended wt loss, pleuritic or exertional cp, hempoptysis, orthopnea pnd or change in chronic leg swelling. Also denies presyncope, palpitations, heartburn, abdominal pain, nausea, vomiting, diarrhea or change in bowel or urinary habits, dysuria,hematuria, rash, arthralgias, visual complaints, headache, numbness weakness or ataxia.     Objective:   Physical Exam  Gen. Pleasant, well-nourished, in no distress ENT - no thrush, no pallor/icterus,no post nasal drip Neck: No JVD, no thyromegaly, no carotid bruits Lungs: no use of accessory muscles, no dullness to percussion, clear without rales or rhonchi  Cardiovascular: Rhythm regular, heart sounds  normal, no murmurs or gallops, no peripheral edema Musculoskeletal: No deformities, no cyanosis or clubbing         Assessment & Plan:

## 2022-07-01 NOTE — Assessment & Plan Note (Addendum)
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

## 2022-07-01 NOTE — Assessment & Plan Note (Signed)
Resolved and etiology seems to have an SVC syndrome

## 2022-07-22 ENCOUNTER — Other Ambulatory Visit: Payer: Self-pay | Admitting: Neurology

## 2022-07-22 DIAGNOSIS — I10 Essential (primary) hypertension: Secondary | ICD-10-CM | POA: Diagnosis not present

## 2022-08-19 ENCOUNTER — Other Ambulatory Visit: Payer: Self-pay | Admitting: Neurology

## 2022-08-19 DIAGNOSIS — R202 Paresthesia of skin: Secondary | ICD-10-CM

## 2022-08-19 DIAGNOSIS — G2581 Restless legs syndrome: Secondary | ICD-10-CM

## 2022-08-19 NOTE — Progress Notes (Unsigned)
HISTORY  [name] is a 77 year old female, seen in request by her primary care physician Dr. Woody Seller, Rennis Petty B, for evaluation of restless leg, chronic insomnia, she is accompanied by her husband at today's visit on December 15, 2018.   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   She walks regularly, wearing 1 pound ankle weight,   She complains recent onset bilateral toes paresthesia  She return for electrodiagnostic study in Sept 2020,  showed evidence of mild axonal sensorimotor polyneuropathy, and moderate right carpal tunnel syndrome.   Her sleep is much improved with Lyrica 25 mg 4 tablets every night, but she still get up occasionally pacing around walking 500 steps, read books before she can go back to sleep again   Previously she has tried melatonin which did help her sleep    Laboratory evaluations showed hyponatremia with sodium level of 127, repeat test showed 133, normal CBC, vitamin D 44, ANA, copper level, ferritin was mildly elevated 185, protein electrophoresis showed no significant abnormality, A1c was 5.5, normal ESR, CPK, W88,  RPR, folic acid, C-reactive protein, TSH,  UPDATE Sept 26 2023: Hospital admission [facility] in March 2023 for acute mental status change, with sodium level of 110, was given salt tablet, also had a UTI requiring antibiotics  Hospital admission again in May 2023 for bilateral lower extremity swelling, shortness of breath,  Echocardiogram showed normal ejection fraction 55 to 60%, no regional wall abnormality Venous ultrasound of bilateral lower extremity showed deep venous thrombosis in the distal right femoral vein, right gastric anemias vein, left posterior tibial and peroneal veins,  CT angiogram chest showed bilateral upper lobe pulmonary emboli, moderate to large bilateral pleural effusion, right greater than left, with compressive atelectasis in left lower lobe,  CT head without contrast showed no acute abnormality  Laboratory evaluations showed CBC anemia hemoglobin of 8.8, BMP creatinine of 1.0, GFR of 58,  She underwent thoracentesis, peritoneal fluid total protein 3.4, glucose 99, cell count nucleated cell 1584, 31% neutrophil, 61% lymphocytes, normal TSH, PTH, calcitriol,  optimize calcium, most recent sodium 139 within normal limits  CT angiogram also demonstrated Marinaro increased appearance of the superior vena cava, this is in keeping with 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, 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 Confusion about Lyrica dosing after last visit with Dr. Krista Blue intended to reduce from 300 mg at bedtime down to 75 mg 3 times daily. She has actually been taking 75 mg TID, 150 mg at bedtime. Of course RLS under great control, remains on Neupro. Is out of Lyrica since taking higher dose. Last fill 07/28/22 # 90  REVIEW OF SYSTEMS: Out of a complete 14 system review of symptoms, the patient complains only of the following symptoms, and all other reviewed systems are negative.  See HPI  ALLERGIES: Allergies  Allergen Reactions   Iodinated Contrast Media Hives and Rash   Atorvastatin Calcium Other (See Comments)    Musculoskeletal aches Other reaction(s): Other (See Comments) Musculoskeletal aches   Doxycycline Other (See Comments)    Liver problems Other reaction(s): Other (See Comments) Liver problems   Heparin Other (See Comments)    Causes blood clots. Other reaction(s): Other (See Comments) Causes blood clots.   Lisinopril Other (See Comments)    cough Other reaction(s): Other (See Comments) cough   Lovastatin Diarrhea   Ropinirole Hcl Nausea Only    HOME MEDICATIONS: Outpatient Medications Prior to Visit  Medication Sig Dispense Refill   Ascorbic Acid (VITAMIN C) 1000 MG tablet Take 1,000 mg by mouth 2 (two) times a day.     b complex vitamins tablet Take 2 tablets by mouth daily.     diltiazem (CARDIZEM CD) 180 MG 24 hr capsule Take 180 mg by mouth daily.     ELIQUIS 5 MG TABS tablet TAKE 1 TABLET TWICE DAILY 60 tablet 0   Ferrous Sulfate (IRON SUPPLEMENT PO) Take 2 tablets by mouth daily.     hydrALAZINE (APRESOLINE) 50 MG tablet Take 1 tablet (50 mg total) by mouth 3 (three) times daily. (Patient taking differently: Take 50 mg by mouth 2 (two) times daily.) 90 tablet 1   isosorbide mononitrate (IMDUR) 30 MG 24 hr tablet Take 1 tablet (30 mg total) by mouth daily. 30 tablet 1   levothyroxine (SYNTHROID) 75 MCG tablet Take 75 mcg by mouth daily before breakfast.      Magnesium Malate 1250 (141.7 Mg) MG TABS Take 1,250 mg by mouth. Two tablets daily.     Misc Natural Products (YUMVS BEET ROOT-TART CHERRY PO) Take by mouth.     rosuvastatin (CRESTOR) 5 MG tablet Take 1 tablet by mouth once a week.     Rotigotine (NEUPRO) 1 MG/24HR PT24 PLACE 1 PATCH (1 MG TOTAL) ONTO THE SKIN DAILY. 90 patch 1   sodium chloride 1 g tablet Take 1 g by mouth 3 (three) times daily.     torsemide (DEMADEX) 20 MG tablet Take 20 mg by mouth 3 (three) times a week.     pregabalin (LYRICA) 75 MG capsule Take 1 capsule (75 mg total) by mouth 3 (three) times daily. TAKE 2 CAPSULES AT BEDTIME--MAY TAKE AN EXTRA CAPSULE IF NEEDED. 90 capsule 5   HORSE CHESTNUT PO Take 1 capsule by mouth daily.     potassium chloride (KLOR-CON M) 10 MEQ tablet Take 10 mEq by mouth daily.     No facility-administered medications prior to visit.    PAST MEDICAL HISTORY: Past Medical History:  Diagnosis Date   Dysphasia    Endometrial cancer ([place])    Heel spur    Heparin induced thrombocytopenia (HCC)    Hypercholesteremia  Hypertension    Hypothyroidism    Migraine headache    Osteopenia    RLS (restless legs syndrome)    SVC syndrome    Thyroid disease     PAST SURGICAL HISTORY: Past Surgical History:  Procedure Laterality Date   ABDOMINAL AORTIC ENDOVASCULAR STENT GRAFT Right 10/19/2021   Procedure: CENTRAL VENOGRAM;  Surgeon: Waynetta Sandy, MD;  Location: [place];  Service: Vascular;  Laterality: Right;   ABDOMINAL HYSTERECTOMY     ANGIOPLASTY Right 10/19/2021   Procedure: BALLOON ANGIOPLASTY INTERNAL JUGULAR, INNOMINATE AND SUBCLAVIAN;  Surgeon: Waynetta Sandy, MD;  Location: [place];  Service: Vascular;  Laterality: Right;   ANKLE SURGERY Right    ARM AMPUTATION AT [place]     right index finger at knuckle   INSERTION OF ILIAC STENT Right 10/19/2021   Procedure: INSERTION OF SUBCLAVIAN  STENT;  Surgeon: Waynetta Sandy, MD;   Location: [facility] OR;  Service: Vascular;  Laterality: Right;   PORT-A-CATH REMOVAL     TONSILLECTOMY      FAMILY HISTORY: Family History  Problem Relation Age of Onset   Stroke Mother    Transient ischemic attack Father    Dementia Father    Pneumonia Father    Diabetes Sister    Rheum arthritis Sister    Brain cancer Brother    Breast cancer Maternal Grandmother    Stroke Paternal Grandmother     SOCIAL HISTORY: Social History   Socioeconomic History   Marital status: Married    Spouse name: Not on file   Number of children: 3   Years of education: college   Highest education level: Not on file  Occupational History   Occupation: Retired Pharmacist, hospital  Tobacco Use   Smoking status: Never    Passive exposure: Never   Smokeless tobacco: Never  Vaping Use   Vaping Use: Never used  Substance and Sexual Activity   Alcohol use: No   Drug use: No   Sexual activity: Not on file  Other Topics Concern   Not on file  Social History Narrative   Lives at home with husband.   Right-handed.   No caffeine use.   Social Determinants of Health   Financial Resource Strain: Not on file  Food Insecurity: Not on file  Transportation Needs: Not on file  Physical Activity: Not on file  Stress: Not on file  Social Connections: Not on file  Intimate Partner Violence: Not on file   PHYSICAL EXAM  Vitals:   08/20/22 1415  BP: (!) 153/83  Pulse: (!) 103  Weight: 150 lb (68 kg)  Height: 5\' 4"  (1.626 m)   Physical Exam  General: The patient is alert and cooperative at the time of the examination.  Skin: No significant peripheral edema is noted.  Neurologic Exam  Mental status: The patient is alert and oriented x 3 at the time of the examination. The patient has apparent normal recent and remote memory, with an apparently normal attention span and concentration ability.   Cranial nerves: Facial symmetry is present. Speech is normal, no aphasia or dysarthria is noted. Extraocular  movements are full. Visual fields are full.  Motor: The patient has good strength in all 4 extremities, but left arm above elbow amputation   Sensory examination: Soft touch sensation is symmetric on the face, arms, and legs.  Coordination: The patient has good finger-nose-finger and heel-to-shin bilaterally.  Gait and station: The patient has a normal gait.  Reflexes: Deep tendon reflexes are symmetric.  DIAGNOSTIC DATA (LABS, IMAGING, TESTING) - I reviewed patient records, labs, notes, testing and imaging myself where available.  Lab Results  Component Value Date   WBC 10.1 10/20/2021   HGB 8.8 (L) 10/20/2021   HCT 27.8 (L) 10/20/2021   MCV 97.9 10/20/2021   PLT 196 10/20/2021      Component Value Date/Time   NA 139 10/20/2021 0028   NA 132 (L) 12/22/2018 1436   K 4.5 10/20/2021 0028   CL 107 10/20/2021 0028   CO2 27 10/20/2021 0028   GLUCOSE 130 (H) 10/20/2021 0028   BUN 29 (H) 10/20/2021 0028   BUN 13 12/22/2018 1436   CREATININE 1.01 (H) 10/20/2021 0028   CALCIUM 7.3 (L) 10/20/2021 0028   PROT 5.5 (L) 10/15/2021 0452   PROT 7.1 12/15/2018 1520   ALBUMIN 2.9 (L) 10/15/2021 0452   ALBUMIN 5.0 (H) 12/15/2018 1520   AST 21 10/15/2021 0452   ALT 27 10/15/2021 0452   ALKPHOS 134 (H) 10/15/2021 0452   BILITOT 0.5 10/15/2021 0452   BILITOT 0.7 12/15/2018 1520   GFRNONAA 58 (L) 10/20/2021 0028   GFRAA 91 12/22/2018 1436   No results found for: "CHOL", "HDL", "LDLCALC", "LDLDIRECT", "TRIG", "CHOLHDL" Lab Results  Component Value Date   HGBA1C 5.5 12/15/2018   Lab Results  Component Value Date   VITAMINB12 >2000 (H) 12/15/2018   Lab Results  Component Value Date   TSH 2.416 09/28/2021   ASSESSMENT AND PLAN 77 y.o. year old female  here with:  Restless leg syndrome 2.   Chronic insomnia 3.   History of Bilateral Lower extremity DVT,  PE on Eliquis  -Medication confusion, planned to lower dose after last visit with Dr. Krista Blue was taking Lyrica 300 mg at  bedtime, she was supposed to lower to 75 mg TID (or 2 at night, 1 extra if needed), she actually took 75 mg TID + 150 mg at bedtime, has now ran out of medications, we will plan to reduce again, 75 mg BID +150 mg at bedtime x 1 week, then 75 mg AM + 150 mg PM (or taking 3 at bedtime) -Continue Neupro 1 mg patch -Call for dose adjustment, follow up in 6 months -I sent in early refill of Lyrica  Meds ordered this encounter  Medications   pregabalin (LYRICA) 75 MG capsule    Sig: Take 1 capsule (75 mg total) by mouth 3 (three) times daily.    Dispense:  90 capsule    Refill:  1    Early refill due to taking extra, confusing with dosing, we are weaning dose    Evangeline Dakin, DNP  [facility] [address] [place], [place] [address] [phone]

## 2022-08-20 ENCOUNTER — Ambulatory Visit: Payer: Medicare PPO | Admitting: Neurology

## 2022-08-20 ENCOUNTER — Encounter: Payer: Self-pay | Admitting: Neurology

## 2022-08-20 DIAGNOSIS — G2581 Restless legs syndrome: Secondary | ICD-10-CM | POA: Diagnosis not present

## 2022-08-20 DIAGNOSIS — R202 Paresthesia of skin: Secondary | ICD-10-CM

## 2022-08-20 MED ORDER — PREGABALIN 75 MG PO CAPS: 75.0000 mg | ORAL_CAPSULE | Freq: Three times a day (TID) | ORAL | 1 refills | Status: DC

## 2022-08-20 NOTE — Patient Instructions (Addendum)
Reduce Lyrica down to 4 tablets daily x 1 week, then reduce to 3 tablets daily, you can take it as 2 tablet at bedtime, extra if needed. Continue Neupro . Call me if needed. See you back in 6 months

## 2022-08-22 DIAGNOSIS — I10 Essential (primary) hypertension: Secondary | ICD-10-CM | POA: Diagnosis not present

## 2022-09-22 DIAGNOSIS — I10 Essential (primary) hypertension: Secondary | ICD-10-CM | POA: Diagnosis not present

## 2022-10-23 DIAGNOSIS — I10 Essential (primary) hypertension: Secondary | ICD-10-CM | POA: Diagnosis not present

## 2022-11-06 DIAGNOSIS — I509 Heart failure, unspecified: Secondary | ICD-10-CM | POA: Diagnosis not present

## 2022-11-06 DIAGNOSIS — E039 Hypothyroidism, unspecified: Secondary | ICD-10-CM | POA: Diagnosis not present

## 2022-11-06 DIAGNOSIS — E785 Hyperlipidemia, unspecified: Secondary | ICD-10-CM | POA: Diagnosis not present

## 2022-11-06 DIAGNOSIS — N1831 Chronic kidney disease, stage 3a: Secondary | ICD-10-CM | POA: Diagnosis not present

## 2022-11-06 DIAGNOSIS — M8580 Other specified disorders of bone density and structure, unspecified site: Secondary | ICD-10-CM | POA: Diagnosis not present

## 2022-11-06 DIAGNOSIS — I130 Hypertensive heart and chronic kidney disease with heart failure and stage 1 through stage 4 chronic kidney disease, or unspecified chronic kidney disease: Secondary | ICD-10-CM | POA: Diagnosis not present

## 2022-11-06 DIAGNOSIS — M1990 Unspecified osteoarthritis, unspecified site: Secondary | ICD-10-CM | POA: Diagnosis not present

## 2022-11-06 DIAGNOSIS — G2581 Restless legs syndrome: Secondary | ICD-10-CM | POA: Diagnosis not present

## 2022-11-06 DIAGNOSIS — E876 Hypokalemia: Secondary | ICD-10-CM | POA: Diagnosis not present

## 2022-11-17 ENCOUNTER — Other Ambulatory Visit: Payer: Self-pay | Admitting: Neurology

## 2022-11-17 DIAGNOSIS — G2581 Restless legs syndrome: Secondary | ICD-10-CM

## 2022-11-17 DIAGNOSIS — R202 Paresthesia of skin: Secondary | ICD-10-CM

## 2022-11-17 MED ORDER — PREGABALIN 75 MG PO CAPS: 75.0000 mg | ORAL_CAPSULE | Freq: Three times a day (TID) | ORAL | 5 refills | Status: DC

## 2022-11-17 NOTE — Progress Notes (Signed)
Meds ordered this encounter  Medications   pregabalin (LYRICA) 75 MG capsule    Sig: Take 1 capsule (75 mg total) by mouth 3 (three) times daily.    Dispense:  90 capsule    Refill:  5     

## 2022-11-17 NOTE — Telephone Encounter (Signed)
Requested Prescriptions   Pending Prescriptions Disp Refills   pregabalin (LYRICA) 75 MG capsule [Pharmacy Med Name: PREGABALIN 75 MG CAPSULE]  0    Sig: TAKE 1 CAPSULE (75MG  TOTAL) BY MOUTH 3 TIMES DAILY. TAKE 2 CAPSULES AT BEDTIME--MAY TAKE AN EXTRA CAPSULE IF NEEDED.   Last seen 08/20/22, next appt 03/09/23 Dispenses   Dispensed Days Supply Quantity Provider Pharmacy  pregabalin 75 mg capsule 10/20/2022 30 90 capsule Levert Feinstein, MD [facility]  pregabalin 75 mg capsule 09/21/2022 30 90 capsule Glean Salvo, NP [facility]  pregabalin 75 mg capsule 08/20/2022 30 90 capsule Glean Salvo, NP [facility]  PREGABALIN 75 MG CAPSULE 07/28/2022 30 90 each Levert Feinstein, MD [facility]  PREGABALIN 75 MG CAPSULE 06/16/2022 30 90 each Levert Feinstein, MD [facility]  PREGABALIN 75 MG CAPSULE 05/12/2022 30 90 each Levert Feinstein, MD [facility]  PREGABALIN 75 MG CAPSULE 03/31/2022 30 90 each Levert Feinstein, MD [facility]  PREGABALIN 75 MG CAPSULE 02/17/2022 30 90 each Levert Feinstein, MD [facility]  PREGABALIN 100 MG CAPSULE 01/23/2022 30 90 each Levert Feinstein, MD [facility]  PREGABALIN 100 MG CAPSULE 12/09/2021 30 90 each Glean Salvo, NP [facility].Marland KitchenMarland Kitchen

## 2022-11-22 DIAGNOSIS — I10 Essential (primary) hypertension: Secondary | ICD-10-CM | POA: Diagnosis not present

## 2022-12-10 ENCOUNTER — Telehealth: Payer: Self-pay | Admitting: Neurology

## 2022-12-10 NOTE — Telephone Encounter (Signed)
Pt stated the medication for restless isn't working. She is requesting a call back to discuss.

## 2022-12-10 NOTE — Telephone Encounter (Signed)
Called patient and she reports she has ben taking her lyrica, 1 at noon, and two at bedtime. None at breakfast and the neupro patch at 1pm. She states all of her restless leg symptoms have been exacerbated for the last week and she cannot sleep. Would like recommendations on how to proceed.

## 2022-12-10 NOTE — Telephone Encounter (Signed)
Advised patient of Maralyn Sago recommendations about Lyrica frequency adm patient verbalized understanding. She is still using the nuepro patch. Advise to call with questions or cocernsd.

## 2022-12-10 NOTE — Telephone Encounter (Signed)
If RLS symptoms have been well controlled would not adjust regimen too much. May try moving the midday dose of Lyrica to evening with supper, keep the 2 at bedtime. Alternatively, may take 2 at bedtime use the 3rd during the night if needed. In total only 3 capsules daily. Continue neupro 1 mg patch.

## 2022-12-21 ENCOUNTER — Other Ambulatory Visit: Payer: Self-pay | Admitting: Neurology

## 2022-12-23 DIAGNOSIS — I10 Essential (primary) hypertension: Secondary | ICD-10-CM | POA: Diagnosis not present

## 2022-12-29 DIAGNOSIS — E039 Hypothyroidism, unspecified: Secondary | ICD-10-CM | POA: Diagnosis not present

## 2022-12-29 DIAGNOSIS — Z79899 Other long term (current) drug therapy: Secondary | ICD-10-CM | POA: Diagnosis not present

## 2022-12-29 DIAGNOSIS — I10 Essential (primary) hypertension: Secondary | ICD-10-CM | POA: Diagnosis not present

## 2022-12-29 DIAGNOSIS — R5383 Other fatigue: Secondary | ICD-10-CM | POA: Diagnosis not present

## 2022-12-29 DIAGNOSIS — Z1331 Encounter for screening for depression: Secondary | ICD-10-CM | POA: Diagnosis not present

## 2022-12-29 DIAGNOSIS — Z7189 Other specified counseling: Secondary | ICD-10-CM | POA: Diagnosis not present

## 2022-12-29 DIAGNOSIS — E7800 Pure hypercholesterolemia, unspecified: Secondary | ICD-10-CM | POA: Diagnosis not present

## 2022-12-29 DIAGNOSIS — Z0000 Encounter for general adult medical examination without abnormal findings: Secondary | ICD-10-CM | POA: Diagnosis not present

## 2022-12-29 DIAGNOSIS — Z1339 Encounter for screening examination for other mental health and behavioral disorders: Secondary | ICD-10-CM | POA: Diagnosis not present

## 2022-12-29 DIAGNOSIS — Z299 Encounter for prophylactic measures, unspecified: Secondary | ICD-10-CM | POA: Diagnosis not present

## 2023-01-22 DIAGNOSIS — H3122 Choroidal dystrophy (central areolar) (generalized) (peripapillary): Secondary | ICD-10-CM | POA: Diagnosis not present

## 2023-01-22 DIAGNOSIS — H524 Presbyopia: Secondary | ICD-10-CM | POA: Diagnosis not present

## 2023-01-22 DIAGNOSIS — H52223 Regular astigmatism, bilateral: Secondary | ICD-10-CM | POA: Diagnosis not present

## 2023-01-22 DIAGNOSIS — H5213 Myopia, bilateral: Secondary | ICD-10-CM | POA: Diagnosis not present

## 2023-01-22 DIAGNOSIS — H25813 Combined forms of age-related cataract, bilateral: Secondary | ICD-10-CM | POA: Diagnosis not present

## 2023-01-23 DIAGNOSIS — I10 Essential (primary) hypertension: Secondary | ICD-10-CM | POA: Diagnosis not present

## 2023-02-22 DIAGNOSIS — I10 Essential (primary) hypertension: Secondary | ICD-10-CM | POA: Diagnosis not present

## 2023-03-08 NOTE — Progress Notes (Unsigned)
ASSESSMENT AND PLAN 77 y.o. year old female  here with:  Restless leg syndrome 2.   Chronic insomnia 3.   History of Bilateral Lower extremity DVT,  PE on Eliquis  -Continues with RLS symptoms on Neupro patch 1 mg daily, Lyrica 75 mg, up to 3 capsules daily -Check iron panel, b-met with history hyponatremia -If above is normal, plan to increase Neupro 2 mg daily, continue Lyrica -Previously tried and failed: Requip (lost benefit, nausea), gabapentin (dizziness), clonazepam (wore off) -Follow-up 8 months or sooner if needed  HISTORY  [name] is a 77 year old female, seen in request by her primary care physician Dr. Sherril Croon, [facility], for evaluation of restless leg, chronic insomnia, she is accompanied by her husband at today's visit on December 15, 2018.   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   She walks regularly, wearing 1 pound ankle weight,   She complains recent onset bilateral toes paresthesia  She return for electrodiagnostic study in Sept 2020,  showed evidence of mild axonal sensorimotor polyneuropathy, and moderate right carpal tunnel  syndrome.   Her sleep is much improved with Lyrica 25 mg 4 tablets every night, but she still get up occasionally pacing around walking 500 steps, read books before she can go back to sleep again   Previously she has tried melatonin which did help her sleep    Laboratory evaluations showed hyponatremia with sodium level of 127, repeat test showed 133, normal CBC, vitamin D 44, ANA, copper level, ferritin was mildly elevated 185, protein electrophoresis showed no significant abnormality, A1c was 5.5, normal ESR, CPK, B12, RPR, folic acid, C-reactive protein, TSH,  UPDATE Sept 26 2023: Hospital admission [facility] in March 2023 for acute mental status change, with sodium level of 110, was given salt tablet, also had a UTI requiring antibiotics  Hospital admission again in May 2023 for bilateral lower extremity swelling, shortness of breath,  Echocardiogram showed normal ejection fraction 55 to 60%, no regional wall abnormality Venous ultrasound of bilateral lower extremity showed deep venous thrombosis in the distal right femoral vein, right gastric anemias vein, left posterior tibial and peroneal veins,  CT angiogram chest showed bilateral upper lobe pulmonary emboli, moderate to large bilateral pleural effusion, right greater than left, with compressive atelectasis in left lower lobe,  CT head without contrast showed no acute abnormality  Laboratory evaluations showed CBC anemia hemoglobin of 8.8, BMP creatinine of 1.0, GFR of 58,  She underwent thoracentesis, peritoneal fluid total protein 3.4, glucose 99, cell count nucleated cell 1584, 31% neutrophil, 61% lymphocytes, normal TSH, PTH, calcitriol,  optimize calcium, most recent sodium 139 within normal limits  CT angiogram also demonstrated  Marinaro increased appearance of the superior vena cava, this is in keeping with 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 Confusion about Lyrica dosing after last visit with Dr. Terrace Arabia intended to reduce from 300 mg at bedtime down to 75 mg 3 times daily. She has actually been taking 75 mg TID, 150 mg at bedtime. Of course RLS under great control, remains on Neupro. Is out of Lyrica since taking higher dose. Last fill 07/28/22 # 90  Update March 09, 2023 SS: remains on Neupro patch 1 mg, Lyrica 75 mg 1 with dinner at , takes  2 at 7:30 PM. Is taking Iron supplement. Even when she walks 2 miles, has RLS symptoms. Feels the need to move her legs > 50% of the time. Of 7 nights, 3 nights the RLS keep her awake. Only sleeps 3-5 nights a week, unrelated to RLS.   REVIEW OF SYSTEMS: Out of a complete 14 system review of symptoms, the patient complains only of the following symptoms, and all other reviewed systems are negative.  See HPI  ALLERGIES: Allergies  Allergen Reactions   Iodinated Contrast Media Hives and Rash   Atorvastatin Calcium Other (See Comments)    Musculoskeletal aches Other reaction(s): Other (See Comments) Musculoskeletal aches   Doxycycline Other (See Comments)    Liver problems Other reaction(s): Other (See Comments) Liver problems   Heparin Other (See Comments)    Causes blood clots. Other reaction(s): Other (See Comments) Causes blood clots.   Lisinopril Other (See Comments)    cough Other reaction(s): Other (See Comments) cough   Lovastatin Diarrhea   Ropinirole  Hcl Nausea Only    HOME MEDICATIONS: Outpatient Medications Prior to Visit  Medication Sig Dispense Refill   Ascorbic Acid (VITAMIN C) 1000 MG tablet Take 1,000 mg by mouth 2 (two) times a day.     b complex vitamins tablet Take 2 tablets by mouth daily.     diltiazem (CARDIZEM CD) 180 MG 24 hr capsule Take 180 mg by mouth daily.     ELIQUIS 5 MG TABS tablet TAKE 1 TABLET TWICE DAILY 60 tablet 0   Ferrous Sulfate (IRON SUPPLEMENT PO) Take 2 tablets by mouth daily.     hydrALAZINE (APRESOLINE) 50 MG tablet Take 1 tablet (50 mg total) by mouth 3 (three) times daily. 90 tablet 1   levothyroxine (SYNTHROID) 75 MCG tablet Take 75 mcg by mouth daily before breakfast.     Magnesium Malate 1250 (141.7 Mg) MG TABS Take 1,250 mg by mouth. Two tablets daily.     NEUPRO 1 MG/24HR PT24 PLACE 1 PATCH (1 MG TOTAL) ONTO THE SKIN DAILY. 90 patch 3   pregabalin (LYRICA) 75 MG capsule Take 1 capsule (75 mg total) by mouth 3 (three) times daily. 90 capsule 5   rosuvastatin (CRESTOR) 5 MG tablet Take 1 tablet by mouth once a week.     torsemide (DEMADEX) 20 MG tablet Take 20 mg by mouth 3 (  three) times a week.     isosorbide mononitrate (IMDUR) 30 MG 24 hr tablet Take 1 tablet (30 mg total) by mouth daily. 30 tablet 1   Misc Natural Products (YUMVS BEET ROOT-TART CHERRY PO) Take by mouth.     sodium chloride 1 g tablet Take 1 g by mouth 3 (three) times daily.     No facility-administered medications prior to visit.    PAST MEDICAL HISTORY: Past Medical History:  Diagnosis Date   Dysphasia    Endometrial cancer (HCC)    Heel spur    Heparin induced thrombocytopenia (HCC)    Hypercholesteremia    Hypertension    Hypothyroidism    Migraine headache    Osteopenia    RLS (restless legs syndrome)    SVC syndrome    Thyroid disease     PAST SURGICAL HISTORY: Past Surgical History:  Procedure Laterality Date   ABDOMINAL AORTIC ENDOVASCULAR STENT GRAFT Right 10/19/2021   Procedure: CENTRAL VENOGRAM;   Surgeon: Maeola Harman, MD;  Location: [facility] OR;  Service: Vascular;  Laterality: Right;   ABDOMINAL HYSTERECTOMY     ANGIOPLASTY Right 10/19/2021   Procedure: BALLOON ANGIOPLASTY INTERNAL JUGULAR, INNOMINATE AND SUBCLAVIAN;  Surgeon: Maeola Harman, MD;  Location: [facility] OR;  Service: Vascular;  Laterality: Right;   ANKLE SURGERY Right    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     FINGER AMPUTATION     right index finger at knuckle   INSERTION OF ILIAC STENT Right 10/19/2021   Procedure: INSERTION OF SUBCLAVIAN  STENT;  Surgeon: Maeola Harman, MD;  Location: [facility] OR;  Service: Vascular;  Laterality: Right;   PORT-A-CATH REMOVAL     TONSILLECTOMY      FAMILY HISTORY: Family History  Problem Relation Age of Onset   Stroke Mother    Transient ischemic attack Father    Dementia Father    Pneumonia Father    Diabetes Sister    Rheum arthritis Sister    Brain cancer Brother    Breast cancer Maternal Grandmother    Stroke Paternal Grandmother     SOCIAL HISTORY: Social History   Socioeconomic History   Marital status: Married    Spouse name: Not on file   Number of children: 3   Years of education: college   Highest education level: Not on file  Occupational History   Occupation: Retired Runner, broadcasting/film/video  Tobacco Use   Smoking status: Never    Passive exposure: Never   Smokeless tobacco: Never  Vaping Use   Vaping status: Never Used  Substance and Sexual Activity   Alcohol use: No   Drug use: No   Sexual activity: Not on file  Other Topics Concern   Not on file  Social History Narrative   Lives at home with husband.   Right-handed.   No caffeine use.   Social Determinants of Health   Financial Resource Strain: Low Risk  (07/31/2021)   Received from [facility], [facility]   Overall Financial Resource Strain (CARDIA)    Difficulty of Paying Living Expenses: Not hard at all  Food Insecurity: No Food Insecurity (07/31/2021)   Received from [facility],  [facility]   Hunger Vital Sign    Worried About Running Out of Food in the Last Year: Never true    Ran Out of Food in the Last Year: Never true  Transportation Needs: No Transportation Needs (07/31/2021)   Received from [facility], [facility]  PRAPARE - Administrator, Civil Service (Medical): No    Lack of Transportation (Non-Medical): No  Physical Activity: Not on file  Stress: Not on file  Social Connections: Not on file  Intimate Partner Violence: Not on file   PHYSICAL EXAM  Vitals:   03/09/23 1454  BP: (!) 150/79  Pulse: 63  Weight: 155 lb 8 oz (70.5 kg)  Height: 5\' 4"  (1.626 m)    Physical Exam  General: The patient is alert and cooperative at the time of the examination.  Skin: No significant peripheral edema is noted.  Neurologic Exam  Mental status: The patient is alert and oriented x 3 at the time of the examination. The patient has apparent normal recent and remote memory, with an apparently normal attention span and concentration ability.  Cranial nerves: Facial symmetry is present. Speech is normal, no aphasia or dysarthria is noted. Extraocular movements are full. Visual fields are full.  Motor: The patient has good strength in all 4 extremities, but left arm above elbow amputation   Sensory examination: Soft touch sensation is symmetric on the face, arms, and legs.  Coordination: The patient has good finger-nose-finger and heel-to-shin bilaterally.  Gait and station: The patient has a normal gait.   Reflexes: Deep tendon reflexes are symmetric.  DIAGNOSTIC DATA (LABS, IMAGING, TESTING) - I reviewed patient records, labs, notes, testing and imaging myself where available.  Lab Results  Component Value Date   WBC 10.1 10/20/2021   HGB 8.8 (L) 10/20/2021   HCT 27.8 (L) 10/20/2021   MCV 97.9 10/20/2021   PLT 196 10/20/2021      Component Value Date/Time   NA 139 10/20/2021 0028   NA 132 (L) 12/22/2018 1436   K 4.5  10/20/2021 0028   CL 107 10/20/2021 0028   CO2 27 10/20/2021 0028   GLUCOSE 130 (H) 10/20/2021 0028   BUN 29 (H) 10/20/2021 0028   BUN 13 12/22/2018 1436   CREATININE 1.01 (H) 10/20/2021 0028   CALCIUM 7.3 (L) 10/20/2021 0028   PROT 5.5 (L) 10/15/2021 0452   PROT 7.1 12/15/2018 1520   ALBUMIN 2.9 (L) 10/15/2021 0452   ALBUMIN 5.0 (H) 12/15/2018 1520   AST 21 10/15/2021 0452   ALT 27 10/15/2021 0452   ALKPHOS 134 (H) 10/15/2021 0452   BILITOT 0.5 10/15/2021 0452   BILITOT 0.7 12/15/2018 1520   GFRNONAA 58 (L) 10/20/2021 0028   GFRAA 91 12/22/2018 1436   No results found for: "CHOL", "HDL", "LDLCALC", "LDLDIRECT", "TRIG", "CHOLHDL" Lab Results  Component Value Date   HGBA1C 5.5 12/15/2018   Lab Results  Component Value Date   VITAMINB12 >2000 (H) 12/15/2018   Lab Results  Component Value Date   TSH 2.416 09/28/2021    Otila Kluver, DNP  [facility] [address] [place], [place] [address] [phone]

## 2023-03-09 ENCOUNTER — Encounter: Payer: Self-pay | Admitting: Neurology

## 2023-03-09 ENCOUNTER — Ambulatory Visit: Payer: Medicare PPO | Admitting: Neurology

## 2023-03-09 VITALS — BP 150/79 | HR 63 | Ht 64.0 in | Wt 155.5 lb

## 2023-03-09 DIAGNOSIS — G2581 Restless legs syndrome: Secondary | ICD-10-CM | POA: Diagnosis not present

## 2023-03-09 DIAGNOSIS — F5104 Psychophysiologic insomnia: Secondary | ICD-10-CM

## 2023-03-10 ENCOUNTER — Telehealth: Payer: Self-pay

## 2023-03-10 ENCOUNTER — Encounter: Payer: Self-pay | Admitting: Neurology

## 2023-03-10 LAB — BASIC METABOLIC PANEL
BUN/Creatinine Ratio: 36 — ABNORMAL HIGH (ref 12–28)
BUN: 41 mg/dL — ABNORMAL HIGH (ref 8–27)
CO2: 22 mmol/L (ref 20–29)
Calcium: 9.8 mg/dL (ref 8.7–10.3)
Chloride: 100 mmol/L (ref 96–106)
Creatinine, Ser: 1.13 mg/dL — ABNORMAL HIGH (ref 0.57–1.00)
Glucose: 98 mg/dL (ref 70–99)
Potassium: 4.3 mmol/L (ref 3.5–5.2)
Sodium: 138 mmol/L (ref 134–144)
eGFR: 50 mL/min/{1.73_m2} — ABNORMAL LOW (ref >59)

## 2023-03-10 LAB — IRON,TIBC AND FERRITIN PANEL
Ferritin: 348 ng/mL — ABNORMAL HIGH (ref 15–150)
Iron Saturation: 26 % (ref 15–55)
Iron: 74 ug/dL (ref 27–139)
Total Iron Binding Capacity: 283 ug/dL (ref 250–450)
UIBC: 209 ug/dL (ref 118–369)

## 2023-03-10 MED ORDER — NEUPRO 2 MG/24HR TD PT24: 1.0000 | MEDICATED_PATCH | Freq: Every day | TRANSDERMAL | 12 refills | Status: DC

## 2023-03-10 NOTE — Telephone Encounter (Signed)
Recent labs faxed to PCP for reveiw

## 2023-03-10 NOTE — Addendum Note (Signed)
Addended by: Glean Salvo on: 03/10/2023 05:49 AM   Modules accepted: Orders

## 2023-03-17 ENCOUNTER — Other Ambulatory Visit: Payer: Self-pay | Admitting: Internal Medicine

## 2023-03-17 DIAGNOSIS — Z0000 Encounter for general adult medical examination without abnormal findings: Secondary | ICD-10-CM

## 2023-03-24 DIAGNOSIS — I10 Essential (primary) hypertension: Secondary | ICD-10-CM | POA: Diagnosis not present

## 2023-04-23 DIAGNOSIS — I10 Essential (primary) hypertension: Secondary | ICD-10-CM | POA: Diagnosis not present

## 2023-04-28 DIAGNOSIS — R6889 Other general symptoms and signs: Secondary | ICD-10-CM | POA: Diagnosis not present

## 2023-04-28 DIAGNOSIS — H2513 Age-related nuclear cataract, bilateral: Secondary | ICD-10-CM | POA: Diagnosis not present

## 2023-04-28 DIAGNOSIS — H4720 Unspecified optic atrophy: Secondary | ICD-10-CM | POA: Diagnosis not present

## 2023-05-04 ENCOUNTER — Ambulatory Visit
Admission: RE | Admit: 2023-05-04 | Discharge: 2023-05-04 | Disposition: A | Discharge status: Home / Self Care | Payer: Medicare PPO | Source: Ambulatory Visit | Attending: Internal Medicine | Admitting: Internal Medicine

## 2023-05-04 DIAGNOSIS — Z0000 Encounter for general adult medical examination without abnormal findings: Secondary | ICD-10-CM

## 2023-05-04 DIAGNOSIS — Z1231 Encounter for screening mammogram for malignant neoplasm of breast: Secondary | ICD-10-CM | POA: Diagnosis not present

## 2023-05-05 DIAGNOSIS — H2511 Age-related nuclear cataract, right eye: Secondary | ICD-10-CM | POA: Diagnosis not present

## 2023-05-24 ENCOUNTER — Other Ambulatory Visit: Payer: Self-pay | Admitting: Neurology

## 2023-05-24 DIAGNOSIS — I10 Essential (primary) hypertension: Secondary | ICD-10-CM | POA: Diagnosis not present

## 2023-05-24 DIAGNOSIS — G2581 Restless legs syndrome: Secondary | ICD-10-CM

## 2023-05-24 DIAGNOSIS — R202 Paresthesia of skin: Secondary | ICD-10-CM

## 2023-05-24 NOTE — Telephone Encounter (Signed)
Requested Prescriptions   Pending Prescriptions Disp Refills   pregabalin (LYRICA) 75 MG capsule [Pharmacy Med Name: PREGABALIN 75 MG CAPSULE]  0    Sig: take 1 capsule (75 MILLIGRAM total) by mouth 3 (three) times daily.   Last seen 03/09/23, next appt 11/09/23 Dispenses   Dispensed Days Supply Quantity Provider Pharmacy  pregabalin 75 mg capsule 04/20/2023 30 90 capsule Glean Salvo, NP [facility]  pregabalin 75 mg capsule 03/19/2023 30 90 capsule Glean Salvo, NP [facility]  pregabalin 75 mg capsule 02/19/2023 30 90 capsule Glean Salvo, NP [facility]  pregabalin 75 mg capsule 01/19/2023 30 90 capsule Glean Salvo, NP [facility]  pregabalin 75 mg capsule 12/18/2022 30 90 capsule Glean Salvo, NP [facility]  pregabalin 75 mg capsule 11/17/2022 30 90 capsule Glean Salvo, NP [facility]  pregabalin 75 mg capsule 10/20/2022 30 90 capsule Levert Feinstein, MD [facility]  pregabalin 75 mg capsule 09/21/2022 30 90 capsule Glean Salvo, NP [facility]  pregabalin 75 mg capsule 08/20/2022 30 90 capsule Glean Salvo, NP [facility]  PREGABALIN 75 MG CAPSULE 07/28/2022 30 90 each Levert Feinstein, MD [facility]  PREGABALIN 75 MG CAPSULE 06/16/2022 30 90 each Levert Feinstein, MD [facility].Marland KitchenMarland Kitchen

## 2023-06-08 DIAGNOSIS — H2512 Age-related nuclear cataract, left eye: Secondary | ICD-10-CM | POA: Diagnosis not present

## 2023-06-23 DIAGNOSIS — I10 Essential (primary) hypertension: Secondary | ICD-10-CM | POA: Diagnosis not present

## 2023-06-24 DIAGNOSIS — H5711 Ocular pain, right eye: Secondary | ICD-10-CM | POA: Diagnosis not present

## 2023-06-24 DIAGNOSIS — H2511 Age-related nuclear cataract, right eye: Secondary | ICD-10-CM | POA: Diagnosis not present

## 2023-07-02 DIAGNOSIS — H2511 Age-related nuclear cataract, right eye: Secondary | ICD-10-CM | POA: Diagnosis not present

## 2023-07-23 DIAGNOSIS — I10 Essential (primary) hypertension: Secondary | ICD-10-CM | POA: Diagnosis not present

## 2023-07-28 ENCOUNTER — Encounter (HOSPITAL_BASED_OUTPATIENT_CLINIC_OR_DEPARTMENT_OTHER): Payer: Self-pay | Admitting: Pulmonary Disease

## 2023-07-28 ENCOUNTER — Telehealth: Payer: Self-pay | Admitting: Neurology

## 2023-07-28 ENCOUNTER — Ambulatory Visit (HOSPITAL_BASED_OUTPATIENT_CLINIC_OR_DEPARTMENT_OTHER)

## 2023-07-28 ENCOUNTER — Encounter: Payer: Self-pay | Admitting: Neurology

## 2023-07-28 ENCOUNTER — Ambulatory Visit (HOSPITAL_BASED_OUTPATIENT_CLINIC_OR_DEPARTMENT_OTHER): Payer: Medicare PPO | Admitting: Pulmonary Disease

## 2023-07-28 VITALS — BP 118/74 | HR 66 | Ht 64.0 in | Wt 161.8 lb

## 2023-07-28 DIAGNOSIS — J90 Pleural effusion, not elsewhere classified: Secondary | ICD-10-CM

## 2023-07-28 DIAGNOSIS — I2699 Other pulmonary embolism without acute cor pulmonale: Secondary | ICD-10-CM | POA: Diagnosis not present

## 2023-07-28 NOTE — Patient Instructions (Signed)
 CXR today  Continue apixaban

## 2023-07-28 NOTE — Telephone Encounter (Signed)
 LVM and sent letter in mail informing pt of need to reschedule 11/09/23 appt - NP out

## 2023-07-28 NOTE — Progress Notes (Signed)
 Subjective:    Patient ID: [name], female    DOB: 02/14/1946, 78 y.o.   MRN: [id number]  HPI   78 yo never smoker  for FU of  bilateral pleural effusions, PE & SVC syndrome   She was admitted 5/6-5/11/23 with syncope and fall and had a small subdural hematoma along the falx.  CT chest showed moderate right and small left pleural effusion with airspace consolidation and air bronchograms in the right middle lobe suspicious for pneumonia.  She underwent right thoracentesis with removal of 1.2 L of fluid, noted to be transudative.  She was treated with Rocephin/azithromycin and discharged on oral antibiotics and 2 L of oxygen.  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  She does have swelling around her ankles.  She reports weight has gone [place] options of hospitalization up to 160 pounds.  PCP checked thyroid levels few months ago and this was normal. Breathing is okay no wheezing.  Med review shows Cardizem and hydralazine.  She takes torsemide 3 times a week          Significant tests/ events reviewed   5/9 right thoracenteses >> 1.2 L fluid removed , transudate  protein less than 3 , LDH  67, cytology showed reactive mesothelial cells , lymphocytic 5/24 right thoracentesis >> 1.4 L, protein 3.4, LDH 190 , lymphocytic 5/25 left thoracentesis >> 800 cc 5/25 CTA chest -bilateral upper lobe segmental PE, extensive collaterals consistent with SVC syndrome 10/17/21 duplex BLE >> distal right femoral vein,right gastrocnemius vein and left posterior tibial and peroneal veins. 11/2021 LE duplex >> Only minimal clot left and right calf vein all other clots have resolved  Review of Systems neg for any significant sore throat, dysphagia, itching, sneezing, nasal congestion or excess/ purulent secretions, fever, chills, sweats, unintended wt loss, pleuritic or exertional cp, hempoptysis, orthopnea pnd or change in chronic leg swelling. Also denies presyncope, palpitations, heartburn, abdominal pain, nausea, vomiting, diarrhea or change in bowel or urinary habits, dysuria,hematuria, rash, arthralgias, visual complaints, headache, numbness weakness or ataxia.     Objective:   Physical Exam  Gen. Pleasant, well-nourished, in no distress ENT - no thrush, no pallor/icterus,no post nasal drip Neck: No JVD, no thyromegaly, no carotid bruits Lungs: no use of accessory muscles, no dullness to percussion, clear without rales or rhonchi  Cardiovascular: Rhythm regular, heart sounds  normal, no murmurs or gallops, no peripheral edema Musculoskeletal: LUE amputee no cyanosis or clubbing        Assessment & Plan:    Pleural effusions -had resolved, transudative, felt to be due to SVC syndrome.  Repeat chest x-ray today to evaluate  Unprovoked PE/chronic right IJ thrombus /occluded right subclavian stent -continue apixaban, likely lifelong.  If she has issues this can be  reduced to 2.5 twice daily dosage

## 2023-08-22 DIAGNOSIS — I10 Essential (primary) hypertension: Secondary | ICD-10-CM | POA: Diagnosis not present

## 2023-08-31 ENCOUNTER — Encounter: Payer: Self-pay | Admitting: Neurology

## 2023-08-31 ENCOUNTER — Ambulatory Visit: Admitting: Neurology

## 2023-08-31 VITALS — BP 133/64 | HR 60 | Ht 64.0 in | Wt 157.5 lb

## 2023-08-31 DIAGNOSIS — R202 Paresthesia of skin: Secondary | ICD-10-CM

## 2023-08-31 DIAGNOSIS — F5104 Psychophysiologic insomnia: Secondary | ICD-10-CM | POA: Diagnosis not present

## 2023-08-31 DIAGNOSIS — G2581 Restless legs syndrome: Secondary | ICD-10-CM

## 2023-08-31 MED ORDER — NEUPRO 2 MG/24HR TD PT24: 1.0000 | MEDICATED_PATCH | Freq: Every day | TRANSDERMAL | 12 refills | Status: AC

## 2023-08-31 MED ORDER — PREGABALIN 75 MG PO CAPS: 75.0000 mg | ORAL_CAPSULE | Freq: Three times a day (TID) | ORAL | 1 refills | Status: DC

## 2023-08-31 NOTE — Progress Notes (Signed)
 ASSESSMENT AND PLAN 78 y.o. year old female  here with:  Restless leg syndrome 2.   Chronic insomnia 3.   History of Bilateral Lower extremity DVT,  PE on Eliquis  -RLS under good control  - Continue Neupro patch 2 mg daily - Continue Lyrica 75 mg up to 3 capsules daily, switch refills to mail order  -Previously tried and failed: Requip (lost benefit, nausea), gabapentin (dizziness), clonazepam (wore off) -Follow-up 1 year or sooner if needed  Meds ordered this encounter  Medications   pregabalin (LYRICA) 75 MG capsule    Sig: Take 1 capsule (75 mg total) by mouth 3 (three) times daily.    Dispense:  270 capsule    Refill:  1    Switching from local pharmacy to mail order to allow 3 month supply   rotigotine (NEUPRO) 2 MG/24HR    Sig: Place 1 patch onto the skin daily.    Dispense:  30 patch    Refill:  12   HISTORY  [name] is a 78 year old female, seen in request by her primary care physician Dr. Sherril Croon, [facility], for evaluation of restless leg, chronic insomnia, she is accompanied by her husband at today's visit on December 15, 2018.   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   She walks regularly, wearing 1 pound ankle weight,   She complains recent onset bilateral toes paresthesia  She return for electrodiagnostic study in Sept 2020,  showed evidence of mild axonal sensorimotor polyneuropathy, and moderate right carpal tunnel syndrome.   Her sleep is much improved with Lyrica 25 mg 4 tablets every night, but she still get up occasionally pacing around walking 500 steps, read books before she can go back to sleep again   Previously she has tried melatonin which did help her sleep    Laboratory evaluations showed hyponatremia with sodium level of 127, repeat test showed 133, normal CBC, vitamin D 44, ANA, copper level, ferritin was mildly elevated 185, protein electrophoresis showed no significant abnormality, A1c was 5.5, normal ESR, CPK, B12, RPR, folic acid, C-reactive protein, TSH,  UPDATE Sept 26 2023: Hospital admission [facility] in March 2023 for acute mental status change, with sodium level of 110, was given salt tablet, also had a UTI requiring antibiotics  Hospital admission again in May 2023 for bilateral lower extremity swelling, shortness of breath,  Echocardiogram showed normal ejection fraction 55 to 60%, no regional wall abnormality Venous ultrasound of bilateral lower extremity showed deep venous thrombosis in the distal right femoral vein, right gastric anemias vein, left posterior tibial and peroneal veins,  CT angiogram chest showed bilateral upper lobe pulmonary emboli, moderate to  large bilateral pleural effusion, right greater than left, with compressive atelectasis in left lower lobe,  CT head without contrast showed no acute abnormality  Laboratory evaluations showed CBC anemia hemoglobin of 8.8, BMP creatinine of 1.0, GFR of 58,  She underwent thoracentesis, peritoneal fluid total protein 3.4, glucose 99, cell count nucleated cell 1584, 31% neutrophil, 61% lymphocytes, normal TSH, PTH,  calcitriol,  optimize calcium, most recent sodium 139 within normal limits  CT angiogram also demonstrated Marinaro increased appearance of the superior vena cava, this is in keeping with 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 Confusion about Lyrica dosing after last visit with Dr. Terrace Arabia intended to reduce from 300 mg at bedtime down to 75 mg 3 times daily. She has actually been taking 75 mg TID, 150 mg at bedtime. Of course RLS under great control, remains on Neupro. Is out of Lyrica since taking higher dose. Last fill 07/28/22 # 90  Update March 09, 2023 SS: remains on Neupro patch 1 mg, Lyrica 75 mg 1 with dinner at , takes  2 at 7:30 PM. Is taking Iron supplement. Even when she walks 2 miles, has RLS symptoms. Feels the need to move her legs > 50% of the time. Of 7 nights, 3 nights the RLS keep her awake. Only sleeps 3-5 nights a week, unrelated to RLS.   Update August 31, 2023 SS: Last visit we increased Neupro patch 2 mg daily, continue Lyrica 75 mg up to 3 capsules daily. Iron panel was normal, ferritin level was high at 348, BMP creatinine 1.13, sodium level 138. Takes Lyrica 75 mg at noon, 150 mg at 6 PM. Sleeps well as can be expected. RLS under good control. Stopped iron supplement. Uses pedal pusher.   REVIEW OF SYSTEMS: Out of a complete 14 system review of symptoms, the patient complains only  of the following symptoms, and all other reviewed systems are negative.  See HPI  ALLERGIES: Allergies  Allergen Reactions   Iodinated Contrast Media Hives and Rash   Atorvastatin Calcium Other (See Comments)    Musculoskeletal aches Other reaction(s): Other (See Comments) Musculoskeletal aches   Doxycycline Other (See Comments)    Liver problems Other reaction(s): Other (See Comments) Liver problems   Heparin Other (See Comments)    Causes blood clots. Other reaction(s): Other (See Comments) Causes blood clots.   Lisinopril Other (See Comments)    cough Other reaction(s): Other (See Comments) cough   Lovastatin Diarrhea   Ropinirole Hcl Nausea Only    HOME MEDICATIONS: Outpatient Medications Prior to Visit  Medication Sig Dispense Refill   Ascorbic Acid (VITAMIN C) 1000 MG tablet Take 1,000 mg by mouth 2 (two) times a day.     b complex vitamins tablet Take 2 tablets by mouth daily.     diltiazem (CARDIZEM CD) 180 MG 24 hr capsule Take 180 mg by mouth daily.     ELIQUIS 5 MG TABS tablet TAKE 1 TABLET TWICE DAILY  60 tablet 0   Ferrous Sulfate (IRON SUPPLEMENT PO) Take 2 tablets by mouth daily.     hydrALAZINE (APRESOLINE) 50 MG tablet Take 1 tablet (50 mg total) by mouth 3 (three) times daily. 90 tablet 1   levothyroxine (SYNTHROID) 75 MCG tablet Take 75 mcg by mouth daily before breakfast.     Magnesium Malate 1250 (141.7 Mg) MG TABS Take 1,250 mg by mouth. Two tablets daily.     pregabalin (LYRICA) 75 MG capsule take 1 capsule (75 MILLIGRAM total) by mouth 3 (three) times daily. 90 capsule 5   rosuvastatin (CRESTOR) 5 MG tablet Take 1 tablet by mouth once a week.     rotigotine (NEUPRO) 2 MG/24HR Place 1 patch onto the skin daily. 30 patch 12   torsemide (DEMADEX) 20 MG tablet Take 20 mg by mouth 3 (three) times a week.     No facility-administered medications prior to visit.    PAST MEDICAL HISTORY: Past Medical History:  Diagnosis Date   Dysphasia    Endometrial  cancer (HCC)    Heel spur    Heparin induced thrombocytopenia (HCC)    Hypercholesteremia    Hypertension    Hypothyroidism    Migraine headache    Osteopenia    RLS (restless legs syndrome)    SVC syndrome    Thyroid disease     PAST SURGICAL HISTORY: Past Surgical History:  Procedure Laterality Date   ABDOMINAL AORTIC ENDOVASCULAR STENT GRAFT Right 10/19/2021   Procedure: CENTRAL VENOGRAM;  Surgeon: Maeola Harman, MD;  Location: [facility] OR;  Service: Vascular;  Laterality: Right;   ABDOMINAL HYSTERECTOMY     ANGIOPLASTY Right 10/19/2021   Procedure: BALLOON ANGIOPLASTY INTERNAL JUGULAR, INNOMINATE AND SUBCLAVIAN;  Surgeon: Maeola Harman, MD;  Location: [facility] OR;  Service: Vascular;  Laterality: Right;   ANKLE SURGERY Right    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     FINGER AMPUTATION     right index finger at knuckle   INSERTION OF ILIAC STENT Right 10/19/2021   Procedure: INSERTION OF SUBCLAVIAN  STENT;  Surgeon: Maeola Harman, MD;  Location: [facility] OR;  Service: Vascular;  Laterality: Right;   PORT-A-CATH REMOVAL     TONSILLECTOMY      FAMILY HISTORY: Family History  Problem Relation Age of Onset   Stroke Mother    Transient ischemic attack Father    Dementia Father    Pneumonia Father    Diabetes Sister    Rheum arthritis Sister    Brain cancer Brother    Breast cancer Maternal Grandmother    Stroke Paternal Grandmother     SOCIAL HISTORY: Social History   Socioeconomic History   Marital status: Married    Spouse name: Not on file   Number of children: 3   Years of education: college   Highest education level: Not on file  Occupational History   Occupation: Retired Runner, broadcasting/film/video  Tobacco Use   Smoking status: Never    Passive exposure: Never   Smokeless tobacco: Never  Vaping Use   Vaping status: Never Used  Substance and Sexual Activity   Alcohol use: No   Drug use: No   Sexual activity: Not on file  Other Topics Concern   Not on file   Social History Narrative   Lives at home with husband.   Right-handed.   No caffeine use.   Social Drivers of Corporate investment banker Strain: Low Risk  (07/31/2021)   Received from [facility], [facility]   Overall Financial Resource Strain (CARDIA)    Difficulty of Paying Living Expenses: Not hard at all  Food Insecurity: No Food Insecurity (07/31/2021)   Received from [facility], [facility]   Hunger Vital Sign    Worried About Running Out of Food in the Last Year: Never true    Ran Out of Food in the Last Year: Never true  Transportation Needs: No Transportation Needs (07/31/2021)   Received from [facility], [facility]   [facility] - Transportation    Lack of Transportation (Medical): No    Lack of Transportation (Non-Medical): No  Physical Activity: Not on file  Stress: Not on file  Social Connections: Not on file  Intimate Partner Violence: Not on file   PHYSICAL EXAM  Vitals:   08/31/23 1336  BP: 133/64  Pulse: 60  Weight: 157 lb 8 oz (71.4 kg)  Height: 5\' 4"  (1.626 m)   Physical Exam  General: The patient is alert and cooperative at the time of the examination.  Skin: No significant peripheral edema is noted.  Neurologic Exam  Mental status: The patient is alert and oriented x 3 at the time of the examination. The patient has apparent normal recent and remote memory, with an apparently normal attention span and concentration ability.  Cranial nerves: Facial symmetry is present. Speech is normal, no aphasia or dysarthria is noted. Extraocular movements are full. Visual fields are full.  Motor: The patient has good strength in all 4 extremities, but left arm above elbow amputation   Sensory examination: Soft touch sensation is symmetric on the face, arms, and legs.  Coordination: The patient has good finger-nose-finger and heel-to-shin bilaterally.  Gait and station: The patient has a normal gait.   DIAGNOSTIC DATA (LABS, IMAGING,  TESTING) - I reviewed patient records, labs, notes, testing and imaging myself where available.  Lab Results  Component Value Date   WBC 10.1 10/20/2021   HGB 8.8 (L) 10/20/2021   HCT 27.8 (L) 10/20/2021   MCV 97.9 10/20/2021   PLT 196 10/20/2021      Component Value Date/Time   NA 138 03/09/2023 1515   K 4.3 03/09/2023 1515   CL 100 03/09/2023 1515   CO2 22 03/09/2023 1515   GLUCOSE 98 03/09/2023 1515   GLUCOSE 130 (H) 10/20/2021 0028   BUN 41 (H) 03/09/2023 1515   CREATININE 1.13 (H) 03/09/2023 1515   CALCIUM 9.8 03/09/2023 1515   PROT 5.5 (L) 10/15/2021 0452   PROT 7.1 12/15/2018 1520   ALBUMIN 2.9 (L) 10/15/2021 0452   ALBUMIN 5.0 (H) 12/15/2018 1520   AST 21 10/15/2021 0452   ALT 27 10/15/2021 0452   ALKPHOS 134 (H) 10/15/2021 0452   BILITOT 0.5 10/15/2021 0452   BILITOT 0.7 12/15/2018 1520   GFRNONAA 58 (L) 10/20/2021 0028   GFRAA 91 12/22/2018 1436   No results found for: "CHOL", "HDL", "LDLCALC", "LDLDIRECT", "TRIG", "CHOLHDL" Lab Results  Component Value Date   HGBA1C 5.5 12/15/2018   Lab Results  Component Value Date   VITAMINB12 >2000 (H) 12/15/2018   Lab Results  Component Value Date   TSH 2.416 09/28/2021    Otila Kluver, DNP  [facility] [address] [place], [place] [address] [phone]

## 2023-08-31 NOTE — Patient Instructions (Signed)
 Continue current medications.  I will send refills.  Follow-up in 1 year or sooner if needed.  Thanks!!

## 2023-09-06 ENCOUNTER — Other Ambulatory Visit: Payer: Self-pay

## 2023-09-06 ENCOUNTER — Encounter: Payer: Self-pay | Admitting: Physician Assistant

## 2023-09-06 ENCOUNTER — Ambulatory Visit: Admitting: Physician Assistant

## 2023-09-06 DIAGNOSIS — M1612 Unilateral primary osteoarthritis, left hip: Secondary | ICD-10-CM | POA: Insufficient documentation

## 2023-09-06 DIAGNOSIS — M25552 Pain in left hip: Secondary | ICD-10-CM

## 2023-09-06 NOTE — Progress Notes (Signed)
 Office Visit Note   Patient: [name]           Date of Birth: April 01, 1946           MRN: [id number] Visit Date: 09/06/2023              Requested by: Ignatius Specking, MD [address] [place],  [place] [address] PCP: Ignatius Specking, MD   Assessment & Plan: Visit Diagnoses:  1. Pain in left hip   2. Unilateral primary osteoarthritis, left hip     Plan: Pleasant 78 year old woman with increasing left groin pain.  She says this has been developing over years.  She does enjoy walking.  Denies any paresthesia any radicular symptoms any pain in her back.  Pain is focused in her groin and goes down her anterior thigh x-ray shows some sclerotic changes.  And exam is consistent with hip arthritis.  Would like her to visit with Dr. Shon Baton in the meantime we will call her in some meloxicam  Follow-Up Instructions: Return if symptoms worsen or fail to improve.   Orders:  Orders Placed This Encounter  Procedures   XR HIP UNILAT W OR W/O PELVIS 2-3 VIEWS LEFT   No orders of the defined types were placed in this encounter.     Procedures: No procedures performed   Clinical Data: No additional findings.   Subjective: Left hip pain  HPI patient is a pleasant 78 year old woman with increasing left groin pain.  Denies any injuries or falls.  She enjoys walking every day and states this hurts with weightbearing she says it has been developing over the years  Review of Systems  All other systems reviewed and are negative.    Objective: Vital Signs: There were no vitals taken for this visit.  Physical Exam Constitutional:      Appearance: Normal appearance.  Skin:    General: Skin is warm and dry.  Neurological:     General: No focal deficit present.     Mental Status: She is alert and oriented to person, place, and time.  Psychiatric:        Mood and Affect: Mood normal.        Behavior: Behavior normal.     Ortho Exam Left hip she is neurovascular intact low back no pain no  radicular findings her strength is intact she does have some pain reproduced in the groin and anterior thigh with logrolling.  No tenderness over the trochanteric bursa Specialty Comments:  No specialty comments available.  Imaging: No results found.   PMFS History: Patient Active Problem List   Diagnosis Date Noted   Unilateral primary osteoarthritis, left hip 09/06/2023   Bilateral pulmonary embolism (HCC) 10/17/2021   Bilateral pleural effusion 10/14/2021   Prolonged QT interval 10/14/2021   Acquired hypothyroidism 10/14/2021   Essential hypertension 10/14/2021   Syncopal episodes 09/27/2021   Subdural hematoma (HCC) 09/27/2021   Acute cystitis without hematuria 07/12/2021   Closed fracture of right distal radius and ulna 02/23/2019   Chronic insomnia 12/15/2018   Restless leg syndrome 12/15/2018   Paresthesia 12/15/2018   Past Medical History:  Diagnosis Date   Dysphasia    Endometrial cancer (HCC)    Heel spur    Heparin induced thrombocytopenia (HCC)    Hypercholesteremia    Hypertension    Hypothyroidism    Migraine headache    Osteopenia    RLS (restless legs syndrome)    SVC syndrome    Thyroid disease  Family History  Problem Relation Age of Onset   Stroke Mother    Transient ischemic attack Father    Dementia Father    Pneumonia Father    Diabetes Sister    Rheum arthritis Sister    Brain cancer Brother    Breast cancer Maternal Grandmother    Stroke Paternal Grandmother     Past Surgical History:  Procedure Laterality Date   ABDOMINAL AORTIC ENDOVASCULAR STENT GRAFT Right 10/19/2021   Procedure: CENTRAL VENOGRAM;  Surgeon: Adine Hoof, MD;  Location: [facility] OR;  Service: Vascular;  Laterality: Right;   ABDOMINAL HYSTERECTOMY     ANGIOPLASTY Right 10/19/2021   Procedure: BALLOON ANGIOPLASTY INTERNAL JUGULAR, INNOMINATE AND SUBCLAVIAN;  Surgeon: Adine Hoof, MD;  Location: [facility] OR;  Service: Vascular;  Laterality: Right;    ANKLE SURGERY Right    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     FINGER AMPUTATION     right index finger at knuckle   INSERTION OF ILIAC STENT Right 10/19/2021   Procedure: INSERTION OF SUBCLAVIAN  STENT;  Surgeon: Adine Hoof, MD;  Location: [facility] OR;  Service: Vascular;  Laterality: Right;   PORT-A-CATH REMOVAL     TONSILLECTOMY     Social History   Occupational History   Occupation: Retired Runner, broadcasting/film/video  Tobacco Use   Smoking status: Never    Passive exposure: Never   Smokeless tobacco: Never  Vaping Use   Vaping status: Never Used  Substance and Sexual Activity   Alcohol use: No   Drug use: No   Sexual activity: Not on file

## 2023-09-22 DIAGNOSIS — I10 Essential (primary) hypertension: Secondary | ICD-10-CM | POA: Diagnosis not present

## 2023-10-04 DIAGNOSIS — Z1331 Encounter for screening for depression: Secondary | ICD-10-CM | POA: Diagnosis not present

## 2023-10-04 DIAGNOSIS — S58119A Complete traumatic amputation at level between elbow and wrist, unspecified arm, initial encounter: Secondary | ICD-10-CM | POA: Diagnosis not present

## 2023-10-04 DIAGNOSIS — Z7189 Other specified counseling: Secondary | ICD-10-CM | POA: Diagnosis not present

## 2023-10-04 DIAGNOSIS — I82210 Acute embolism and thrombosis of superior vena cava: Secondary | ICD-10-CM | POA: Diagnosis not present

## 2023-10-04 DIAGNOSIS — Z0000 Encounter for general adult medical examination without abnormal findings: Secondary | ICD-10-CM | POA: Diagnosis not present

## 2023-10-04 DIAGNOSIS — Z1339 Encounter for screening examination for other mental health and behavioral disorders: Secondary | ICD-10-CM | POA: Diagnosis not present

## 2023-10-04 DIAGNOSIS — I700 Atherosclerosis of aorta: Secondary | ICD-10-CM | POA: Diagnosis not present

## 2023-10-04 DIAGNOSIS — I10 Essential (primary) hypertension: Secondary | ICD-10-CM | POA: Diagnosis not present

## 2023-10-04 DIAGNOSIS — Z299 Encounter for prophylactic measures, unspecified: Secondary | ICD-10-CM | POA: Diagnosis not present

## 2023-10-07 ENCOUNTER — Encounter: Payer: Self-pay | Admitting: Sports Medicine

## 2023-10-07 ENCOUNTER — Ambulatory Visit: Admitting: Sports Medicine

## 2023-10-07 ENCOUNTER — Other Ambulatory Visit: Payer: Self-pay

## 2023-10-07 DIAGNOSIS — M1612 Unilateral primary osteoarthritis, left hip: Secondary | ICD-10-CM | POA: Diagnosis not present

## 2023-10-07 DIAGNOSIS — M25552 Pain in left hip: Secondary | ICD-10-CM | POA: Diagnosis not present

## 2023-10-07 DIAGNOSIS — G8929 Other chronic pain: Secondary | ICD-10-CM | POA: Diagnosis not present

## 2023-10-07 DIAGNOSIS — R29898 Other symptoms and signs involving the musculoskeletal system: Secondary | ICD-10-CM | POA: Diagnosis not present

## 2023-10-07 MED ORDER — LIDOCAINE HCL 1 % IJ SOLN
4.0000 mL | INTRAMUSCULAR | Status: AC | PRN
Start: 2023-10-07 — End: 2023-10-07
  Administered 2023-10-07: 4 mL

## 2023-10-07 MED ORDER — METHYLPREDNISOLONE ACETATE 40 MG/ML IJ SUSP
40.0000 mg | INTRAMUSCULAR | Status: AC | PRN
Start: 2023-10-07 — End: 2023-10-07
  Administered 2023-10-07: 40 mg via INTRA_ARTICULAR

## 2023-10-07 NOTE — Progress Notes (Signed)
 [name] - 78 y.o. female MRN [id number]  Date of birth: 1946-05-18  Office Visit Note: Visit Date: 10/07/2023 PCP: Orlena Bitters, MD Referred by: Orlena Bitters, MD  Subjective: Chief Complaint  Patient presents with   Left Hip - Pain   HPI: [name] is a pleasant 78 y.o. female who presents today for progressive worsening of chronic left hip pain.  [name] has chronic left hip pain but this has been increasing over the last month.  Initially her pain was in the groin but now it is wraparound to the back part of the hip as well.  This is interfering with walking and limiting her daily function.  She does take ibuprofen as needed, although she is on Eliquis  for history of PE.  She has trouble lifting the leg, specifically keep getting in and out of the car.  She is interested in an injection today if I feel this will be helpful.  Pertinent ROS were reviewed with the patient and found to be negative unless otherwise specified above in HPI.   Assessment & Plan: Visit Diagnoses:  1. Unilateral primary osteoarthritis, left hip   2. Chronic left hip pain   3. Weakness of both hips   4. Effusion of left hip    Plan: Impression is chronic exacerbation of left hip osteoarthritis with a small effusion noted on ultrasound.  She does have mild osteoarthritic change of both hips as well as some hip weakness bilaterally.  Through shared decision-making, we did proceed with ultrasound-guided left intra-articular hip injection, patient tolerated well and even had good relief of her pain minutes following the procedure.  I would like to get her started in formalized physical therapy to work on her hip stabilization and proximal leg muscle strength, referral sent to [facility].  In the past, she was recommended to take meloxicam, but given her Eliquis  use I would like her to not take this.  Advised on ice/heat or Tylenol for any postinjection pain.  I would like to see her back in about 5 weeks to  reevaluate the left hip and both of her hip strength after the injection and her PT.  Follow-up: Return in about 5 weeks (around 11/11/2023) for Left hip f/u .   Meds & Orders: No orders of the defined types were placed in this encounter.   Orders Placed This Encounter  Procedures   Large Joint Inj: L hip joint   US  Guided Needle Placement - No Linked Charges   Ambulatory referral to Physical Therapy     Procedures: Large Joint Inj: L hip joint on 10/07/2023 2:12 PM Indications: pain Details: 22 G 3.5 in needle, ultrasound-guided anterior approach Medications: 4 mL lidocaine  1 %; 40 mg methylPREDNISolone  acetate 40 MG/ML Outcome: tolerated well, no immediate complications  Procedure: US -guided intra-articular hip injection, Left After discussion on risks/benefits/indications and informed verbal consent was obtained, a timeout was performed. Patient was lying supine on exam table. The hip was cleaned with betadine and alcohol  swabs. Then utilizing ultrasound guidance, the patient's femoral head and neck junction was identified and subsequently injected with 4:1 lidocaine :depomedrol via an in-plane approach with ultrasound visualization of the injectate administered into the hip joint. Patient tolerated procedure well without immediate complications.  Procedure, treatment alternatives, risks and benefits explained, specific risks discussed. Consent was given by the patient. Immediately prior to procedure a time out was called to verify the correct patient, procedure, equipment, support staff and site/side marked as required. Patient  was prepped and draped in the usual sterile fashion.          Clinical History: No specialty comments available.  She reports that she has never smoked. She has never been exposed to tobacco smoke. She has never used smokeless tobacco. No results for input(s): "HGBA1C", "LABURIC" in the last 8760 hours.  Objective:    Physical Exam  Gen: Well-appearing, in  no acute distress; non-toxic CV: Well-perfused. Warm.  Resp: Breathing unlabored on room air; no wheezing. Psych: Fluid speech in conversation; appropriate affect; normal thought process  Ortho Exam - Bilateral hips: + Mild TTP over the left greater trochanteric region.  There is no bony restriction with internal or external logroll but mild pain with endrange internal rotation.  There is positive FADIR as well as Stinchfield testing.  There is weakness with hip abduction bilaterally, left 3/5, right 4/5 as well as pain and some weakness with hip flexion of the left hip.  Imaging:  - 2-view (AP, lateral) X-rays of the left hip show mild osteoarthritis of bilateral hips.  The left hip does have some calcification over the superior lateral aspect of the joint, possibly indicative of either chondrocalcinosis or possible intra-articular loose body.  There is no acute fracture of the hip noted.2 view hip x-ray from 09/06/2023 was independently reviewed and interpreted by myself today.  Past Medical/Family/Surgical/Social History: Medications & Allergies reviewed per EMR, new medications updated. Patient Active Problem List   Diagnosis Date Noted   Unilateral primary osteoarthritis, left hip 09/06/2023   Bilateral pulmonary embolism (HCC) 10/17/2021   Bilateral pleural effusion 10/14/2021   Prolonged QT interval 10/14/2021   Acquired hypothyroidism 10/14/2021   Essential hypertension 10/14/2021   Syncopal episodes 09/27/2021   Subdural hematoma (HCC) 09/27/2021   Acute cystitis without hematuria 07/12/2021   Closed fracture of right distal radius and ulna 02/23/2019   Chronic insomnia 12/15/2018   Restless leg syndrome 12/15/2018   Paresthesia 12/15/2018   Past Medical History:  Diagnosis Date   Dysphasia    Endometrial cancer (HCC)    Heel spur    Heparin induced thrombocytopenia (HCC)    Hypercholesteremia    Hypertension    Hypothyroidism    Migraine headache    Osteopenia    RLS  (restless legs syndrome)    SVC syndrome    Thyroid  disease    Family History  Problem Relation Age of Onset   Stroke Mother    Transient ischemic attack Father    Dementia Father    Pneumonia Father    Diabetes Sister    Rheum arthritis Sister    Brain cancer Brother    Breast cancer Maternal Grandmother    Stroke Paternal Grandmother    Past Surgical History:  Procedure Laterality Date   ABDOMINAL AORTIC ENDOVASCULAR STENT GRAFT Right 10/19/2021   Procedure: CENTRAL VENOGRAM;  Surgeon: Adine Hoof, MD;  [facility] OR;  Service: Vascular;  Laterality: Right;   ABDOMINAL HYSTERECTOMY     ANGIOPLASTY Right 10/19/2021   Procedure: BALLOON ANGIOPLASTY INTERNAL JUGULAR, INNOMINATE AND SUBCLAVIAN;  Surgeon: Adine Hoof, MD;  Location: [facility] OR;  Service: Vascular;  Laterality: Right;   ANKLE SURGERY Right    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     FINGER AMPUTATION     right index finger at knuckle   INSERTION OF ILIAC STENT Right 10/19/2021   Procedure: INSERTION OF SUBCLAVIAN  STENT;  Surgeon: Adine Hoof, MD;  Location: [facility] OR;  Service: Vascular;  Laterality: Right;  PORT-A-CATH REMOVAL     TONSILLECTOMY     Social History   Occupational History   Occupation: Retired Runner, broadcasting/film/video  Tobacco Use   Smoking status: Never    Passive exposure: Never   Smokeless tobacco: Never  Vaping Use   Vaping status: Never Used  Substance and Sexual Activity   Alcohol  use: No   Drug use: No   Sexual activity: Not on file

## 2023-10-07 NOTE — Progress Notes (Signed)
 Patient says that she has had pain through the front, side, and back of the hip. She says that she has trouble any time she has to lift the leg, especially when getting into the car. She is here for a hip injection today.

## 2023-10-11 ENCOUNTER — Ambulatory Visit: Attending: Sports Medicine | Admitting: Physical Therapy

## 2023-10-11 ENCOUNTER — Encounter: Payer: Self-pay | Admitting: Physical Therapy

## 2023-10-11 DIAGNOSIS — M25552 Pain in left hip: Secondary | ICD-10-CM | POA: Diagnosis not present

## 2023-10-11 DIAGNOSIS — R29898 Other symptoms and signs involving the musculoskeletal system: Secondary | ICD-10-CM | POA: Diagnosis not present

## 2023-10-11 DIAGNOSIS — M6281 Muscle weakness (generalized): Secondary | ICD-10-CM | POA: Insufficient documentation

## 2023-10-11 DIAGNOSIS — R262 Difficulty in walking, not elsewhere classified: Secondary | ICD-10-CM | POA: Diagnosis not present

## 2023-10-11 DIAGNOSIS — M1612 Unilateral primary osteoarthritis, left hip: Secondary | ICD-10-CM | POA: Insufficient documentation

## 2023-10-11 NOTE — Therapy (Signed)
 OUTPATIENT PHYSICAL THERAPY LOWER EXTREMITY EVALUATION Referring diagnosis? M16.12 (ICD-10-CM) - Unilateral primary osteoarthritis, left hip Treatment diagnosis? (if different than referring diagnosis) M25.552 What was this (referring dx) caused by? []  Surgery []  Fall [x]  Ongoing issue [x]  Arthritis []  Other: ____________  Laterality: []  Rt [x]  Lt []  Both  Check all possible CPT codes:  *CHOOSE 10 OR LESS*    See Planned Interventions listed in the Plan section of the Evaluation.    Patient Name: [name] MRN: [id number] DOB:12/21/1945, 78 y.o., female Today's Date: 10/11/2023  END OF SESSION:  PT End of Session - 10/11/23 1207     Visit Number 1    Number of Visits 12    Date for PT Re-Evaluation 12/06/23    Authorization Type [employer] MCR    Progress Note Due on Visit 10    PT Start Time 1108    PT Stop Time 1150    PT Time Calculation (min) 42 min    Activity Tolerance Patient tolerated treatment well    Behavior During Therapy WFL for tasks assessed/performed             Past Medical History:  Diagnosis Date   Dysphasia    Endometrial cancer (HCC)    Heel spur    Heparin induced thrombocytopenia (HCC)    Hypercholesteremia    Hypertension    Hypothyroidism    Migraine headache    Osteopenia    RLS (restless legs syndrome)    SVC syndrome    Thyroid  disease    Past Surgical History:  Procedure Laterality Date   ABDOMINAL AORTIC ENDOVASCULAR STENT GRAFT Right 10/19/2021   Procedure: CENTRAL VENOGRAM;  Surgeon: Adine Hoof, MD;  Location: [facility] OR;  Service: Vascular;  Laterality: Right;   ABDOMINAL HYSTERECTOMY     ANGIOPLASTY Right 10/19/2021   Procedure: BALLOON ANGIOPLASTY INTERNAL JUGULAR, INNOMINATE AND SUBCLAVIAN;  Surgeon: Adine Hoof, MD;  Location: [facility] OR;  Service: Vascular;  Laterality: Right;   ANKLE SURGERY Right    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     FINGER AMPUTATION     right index finger at knuckle   INSERTION  OF ILIAC STENT Right 10/19/2021   Procedure: INSERTION OF SUBCLAVIAN  STENT;  Surgeon: Adine Hoof, MD;  Location: [facility] OR;  Service: Vascular;  Laterality: Right;   PORT-A-CATH REMOVAL     TONSILLECTOMY     Patient Active Problem List   Diagnosis Date Noted   Unilateral primary osteoarthritis, left hip 09/06/2023   Bilateral pulmonary embolism (HCC) 10/17/2021   Bilateral pleural effusion 10/14/2021   Prolonged QT interval 10/14/2021   Acquired hypothyroidism 10/14/2021   Essential hypertension 10/14/2021   Syncopal episodes 09/27/2021   Subdural hematoma (HCC) 09/27/2021   Acute cystitis without hematuria 07/12/2021   Closed fracture of right distal radius and ulna 02/23/2019   Chronic insomnia 12/15/2018   Restless leg syndrome 12/15/2018   Paresthesia 12/15/2018    PCP: Orlena Bitters, MD   REFERRING PROVIDER:   Shauna Del, DO     REFERRING DIAG:  [phone] (ICD-10-CM) - Unilateral primary osteoarthritis, left hip  R29.898 (ICD-10-CM) - Weakness of both hips    THERAPY DIAG:  Pain in left hip  Muscle weakness (generalized)  Difficulty in walking, not elsewhere classified  Rationale for Evaluation and Treatment: Rehabilitation  ONSET DATE: 1 year of pain  SUBJECTIVE:   SUBJECTIVE STATEMENT: She relays one year of pain that has become worse. The pain came on gradually without known  cause. She did have hip injection 10/07/23 and reports this has helped with her pain. But still with difficulty walking, getting up from standing, getting in/out of car or bed. Does also relay balance deficits.   PERTINENT HISTORY: Left arm amputation.   PAIN:  NPRS scale: 3/10 upon arrival Pain location:Left hip posterior and lateral Pain description: constant ache Aggravating factors: walking, getting leg in/out car, standing after sitting for a while Relieving factors: rest, did not notice any difference from ice, has not tried heat, can not take NSAIDS due to on  blood thinners.    PRECAUTIONS: None  RED FLAGS: None   WEIGHT BEARING RESTRICTIONS: No  FALLS:  Has patient fallen in last 6 months? Yes. Number of falls 1 She does report some trouble with her balance LIVING ENVIRONMENT: No stairs or steps  PLOF: Needs assistance with ADLs due to UE limb loss.   PATIENT GOALS: reduce pain. Walk better  NEXT MD VISIT: 11/11/23  OBJECTIVE:  Note: Objective measures were completed at Evaluation unless otherwise noted.  DIAGNOSTIC FINDINGS: "x-ray shows some sclerotic changes, OA"  PATIENT SURVEYS:  Patient-Specific Activity Scoring Scheme  "0" represents "unable to perform." "10" represents "able to perform at prior level. 0 1 2 3 4 5 6 7 8 9  10 (Date and Score)   Activity Eval     1. Walking outside 4    2. Getting leg in/out of car  3    Score 3.5    Total score = sum of the activity scores/number of activities Minimum detectable change (90%CI) for average score = 2 points Minimum detectable change (90%CI) for single activity score = 3 points  LOWER EXTREMITY ROM:  PROM Right eval Left eval  Hip flexion  95  Hip extension    Hip abduction    Hip adduction    Hip internal rotation  [facility]  Hip external rotation  5  Knee flexion    Knee extension    Ankle dorsiflexion    Ankle plantarflexion    Ankle inversion    Ankle eversion     (Blank rows = not tested)  LOWER EXTREMITY MMT:  MMT Right eval Left eval  Hip flexion 4+ 3  Hip extension 4 3  Hip abduction 4 3  Hip adduction    Hip internal rotation    Hip external rotation    Knee flexion 5 4  Knee extension 5 4  Ankle dorsiflexion    Ankle plantarflexion    Ankle inversion    Ankle eversion     (Blank rows = not tested)   FUNCTIONAL TESTS:  Eval 5 times sit to stand: 35 sec, must use UE to push up  GAIT: Comments: ambulates without assistance but pain and difficulty present   TODAY'S TREATMENT:  Eval HEP creation and review with demonstration and  trial set preformed, see below for details Selfcare:Tips to reduce phantom limb sensations in left UE, including using compression wraps, alternating hot/cold, using different textures to rub the area, and light therapy. Discussed importance of improving her hip strength and hip ROM to help her stand and walk better.    PATIENT EDUCATION: Education details: HEP, PT plan of care, self care Person educated: Patient Education method: Explanation, Demonstration, Verbal cues, and Handouts Education comprehension: verbalized understanding and needs further education   HOME EXERCISE PROGRAM: Access Code: [email or web address] URL: [email or web address] Date: 10/11/2023 Prepared by: Jamee Mazzoni  Exercises - Standing Hip Abduction with Counter Support  - 2 x daily -  6 x weekly - 1-2 sets - 10 reps - alternating marching  - 2 x daily - 6 x weekly - 1-2 sets - 10 reps - Standing Hip Extension with Counter Support  - 2 x daily - 6 x weekly - 1-2 sets - 10 reps - Hooklying Single Knee to Chest Stretch (Mirrored)  - 2 x daily - 6 x weekly - 1 sets - 2 reps - 30 sec hold - supine IT band and piriformis stretch  - 2 x daily - 6 x weekly - 1 sets - 30 sec hold - Sit to Stand  - 2 x daily - 6 x weekly - 2 sets - 5 reps - Standing Tandem Balance with Counter Support  - 2 x daily - 6 x weekly - 1 sets - 3 reps - 20 sec hold  ASSESSMENT:  CLINICAL IMPRESSION: Patient referred to PT for left hip pain and weakness. MD Impression is chronic exacerbation of left hip osteoarthritis with a small effusion noted on ultrasound.  She does have mild osteoarthritic change of both hips as well as some hip weakness bilaterally. Patient will benefit from skilled PT to address below impairments, limitations and improve overall function.  OBJECTIVE IMPAIRMENTS: decreased activity tolerance, difficulty walking, decreased balance, decreased endurance, decreased mobility, decreased ROM, decreased strength, impaired  flexibility, impaired UE/LE use, and pain.  ACTIVITY LIMITATIONS: bending, lifting, locomotion, cleaning, community walking  PERSONAL FACTORS: see above PMH, are also affecting patient's functional outcome.  REHAB POTENTIAL: Good  CLINICAL DECISION MAKING: Evolving/moderate complexity  EVALUATION COMPLEXITY: Moderate    GOALS: Short term PT Goals Target date: 11/08/2023   Pt will be I and compliant with HEP. Baseline:  Goal status: New Pt will decrease pain by 25% overall with walking Baseline: Goal status: New  Long term PT goals Target date:12/06/2023   Pt will improve hip ROM to [facility] (hip flexion to 100, hip IR to >10 deg to improve functional mobility Baseline: Goal status: New Pt will improve  hip/knee strength to at least 4+/5 MMT to improve functional strength Baseline: Goal status: New Pt will improve PSFS to at least 5.5/10 functional to show improved function Baseline: Goal status: New Pt will reduce pain to overall less than 3/10 with usual activity and walking outside up to 1 mile. Baseline: Goal status: New  PLAN: PT FREQUENCY: 2 times per week   PT DURATION: 6-8 weeks  PLANNED INTERVENTIONS (unless contraindicated): 97750- Physical Performance Testing, 97110-Therapeutic exercises, 97530- Therapeutic activity, [id number]- Neuromuscular re-education, 97535- Self Care, 16109- Manual therapy, [id number]- Gait training, [phone]- Electrical stimulation (unattended), and 97033- Ionotophoresis 4mg /ml Dexamethasone   PLAN FOR NEXT SESSION: review HEP, focus on left hip strength, gait, balance.    Mick Alamin, PT,DPT 10/11/2023, 12:12 PM

## 2023-10-14 ENCOUNTER — Encounter: Payer: Self-pay | Admitting: Physical Therapy

## 2023-10-14 ENCOUNTER — Ambulatory Visit: Admitting: Physical Therapy

## 2023-10-14 DIAGNOSIS — R29898 Other symptoms and signs involving the musculoskeletal system: Secondary | ICD-10-CM | POA: Diagnosis not present

## 2023-10-14 DIAGNOSIS — M25552 Pain in left hip: Secondary | ICD-10-CM

## 2023-10-14 DIAGNOSIS — R262 Difficulty in walking, not elsewhere classified: Secondary | ICD-10-CM

## 2023-10-14 DIAGNOSIS — M6281 Muscle weakness (generalized): Secondary | ICD-10-CM | POA: Diagnosis not present

## 2023-10-14 DIAGNOSIS — M1612 Unilateral primary osteoarthritis, left hip: Secondary | ICD-10-CM | POA: Diagnosis not present

## 2023-10-14 NOTE — Therapy (Signed)
 OUTPATIENT PHYSICAL THERAPY TREATMENT Referring diagnosis? M16.12 (ICD-10-CM) - Unilateral primary osteoarthritis, left hip Treatment diagnosis? (if different than referring diagnosis) M25.552 What was this (referring dx) caused by? []  Surgery []  Fall [x]  Ongoing issue [x]  Arthritis []  Other: ____________  Laterality: []  Rt [x]  Lt []  Both  Check all possible CPT codes:  *CHOOSE 10 OR LESS*    See Planned Interventions listed in the Plan section of the Evaluation.    Patient Name: [name] MRN: [id number] DOB:[birth year], 78 y.o., female Today's Date: 10/14/2023  END OF SESSION:  PT End of Session - 10/14/23 1138     Visit Number 2    Number of Visits 12    Date for PT Re-Evaluation 12/06/23    Authorization Type [employer] MCR    Progress Note Due on Visit 10    PT Start Time 1140    PT Stop Time 1225    PT Time Calculation (min) 45 min    Activity Tolerance Patient tolerated treatment well    Behavior During Therapy WFL for tasks assessed/performed              Past Medical History:  Diagnosis Date   Dysphasia    Endometrial cancer (HCC)    Heel spur    Heparin induced thrombocytopenia (HCC)    Hypercholesteremia    Hypertension    Hypothyroidism    Migraine headache    Osteopenia    RLS (restless legs syndrome)    SVC syndrome    Thyroid  disease    Past Surgical History:  Procedure Laterality Date   ABDOMINAL AORTIC ENDOVASCULAR STENT GRAFT Right 10/19/2021   Procedure: CENTRAL VENOGRAM;  Surgeon: Adine Hoof, MD;  Location: [facility] OR;  Service: Vascular;  Laterality: Right;   ABDOMINAL HYSTERECTOMY     ANGIOPLASTY Right 10/19/2021   Procedure: BALLOON ANGIOPLASTY INTERNAL JUGULAR, INNOMINATE AND SUBCLAVIAN;  Surgeon: Adine Hoof, MD;  Location: [facility] OR;  Service: Vascular;  Laterality: Right;   ANKLE SURGERY Right    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     FINGER AMPUTATION     right index finger at knuckle   INSERTION OF ILIAC STENT  Right 10/19/2021   Procedure: INSERTION OF SUBCLAVIAN  STENT;  Surgeon: Adine Hoof, MD;  Location: [facility] OR;  Service: Vascular;  Laterality: Right;   PORT-A-CATH REMOVAL     TONSILLECTOMY     Patient Active Problem List   Diagnosis Date Noted   Unilateral primary osteoarthritis, left hip 09/06/2023   Bilateral pulmonary embolism (HCC) 10/17/2021   Bilateral pleural effusion 10/14/2021   Prolonged QT interval 10/14/2021   Acquired hypothyroidism 10/14/2021   Essential hypertension 10/14/2021   Syncopal episodes 09/27/2021   Subdural hematoma (HCC) 09/27/2021   Acute cystitis without hematuria 07/12/2021   Closed fracture of right distal radius and ulna 02/23/2019   Chronic insomnia 12/15/2018   Restless leg syndrome 12/15/2018   Paresthesia 12/15/2018    PCP: Orlena Bitters, MD   REFERRING PROVIDER:   Shauna Del, DO     REFERRING DIAG:  [phone] (ICD-10-CM) - Unilateral primary osteoarthritis, left hip  R29.898 (ICD-10-CM) - Weakness of both hips    THERAPY DIAG:  Pain in left hip  Muscle weakness (generalized)  Difficulty in walking, not elsewhere classified  Rationale for Evaluation and Treatment: Rehabilitation  ONSET DATE: 1 year of pain  SUBJECTIVE:   SUBJECTIVE STATEMENT: 10/14/23  Pain is about 2-3/10 today, she does get some times of sharp pain when standing at  certain angles but not sure what and when, it happens sometimes and other time not. She has been doing her HEP.   Per eval: She relays one year of pain that has become worse. The pain came on gradually without known cause. She did have hip injection 10/07/23 and reports this has helped with her pain. But still with difficulty walking, getting up from standing, getting in/out of car or bed. Does also relay balance deficits.   PERTINENT HISTORY: Left arm amputation.   PAIN:  NPRS scale: 2-3/10 upon arrival Pain location:Left hip posterior and lateral Pain description: constant  ache Aggravating factors: walking, getting leg in/out car, standing after sitting for a while Relieving factors: rest, did not notice any difference from ice, has not tried heat, can not take NSAIDS due to on blood thinners.    PRECAUTIONS: None  RED FLAGS: None   WEIGHT BEARING RESTRICTIONS: No  FALLS:  Has patient fallen in last 6 months? Yes. Number of falls 1 She does report some trouble with her balance LIVING ENVIRONMENT: No stairs or steps  PLOF: Needs assistance with ADLs due to UE limb loss.   PATIENT GOALS: reduce pain. Walk better  NEXT MD VISIT: 11/11/23  OBJECTIVE:  Note: Objective measures were completed at Evaluation unless otherwise noted.  DIAGNOSTIC FINDINGS: "x-ray shows some sclerotic changes, OA"  PATIENT SURVEYS:  Patient-Specific Activity Scoring Scheme  "0" represents "unable to perform." "10" represents "able to perform at prior level. 0 1 2 3 4 5 6 7 8 9  10 (Date and Score)   Activity Eval     1. Walking outside 4    2. Getting leg in/out of car  3    Score 3.5    Total score = sum of the activity scores/number of activities Minimum detectable change (90%CI) for average score = 2 points Minimum detectable change (90%CI) for single activity score = 3 points  LOWER EXTREMITY ROM:  PROM Right eval Left eval  Hip flexion  95  Hip extension    Hip abduction    Hip adduction    Hip internal rotation  [facility]  Hip external rotation  5  Knee flexion    Knee extension    Ankle dorsiflexion    Ankle plantarflexion    Ankle inversion    Ankle eversion     (Blank rows = not tested)  LOWER EXTREMITY MMT:  MMT Right eval Left eval  Hip flexion 4+ 3  Hip extension 4 3  Hip abduction 4 3  Hip adduction    Hip internal rotation    Hip external rotation    Knee flexion 5 4  Knee extension 5 4  Ankle dorsiflexion    Ankle plantarflexion    Ankle inversion    Ankle eversion     (Blank rows = not tested)   FUNCTIONAL TESTS:  Eval 5  times sit to stand: 35 sec, must use UE to push up  GAIT: Comments: ambulates without assistance but pain and difficulty present   TODAY'S TREATMENT:  10/14/23 Nu step L5 X 10 minutes UE/LE Standing hip abduction red X 15 bilat Standing hip extension red X 15 bilat Standing hip adduction red X 15 bilat Standing hip flexion red X 15 bilat Tandem balance modified 30 sec X 3 bilat Standing marches X 10 bilat on airex pad, one UE support needed Step ups onto airex pad X 10 bilat, one UE support needed Supine SKTC stretch 30 sec X 2 on left Supine  Piriformis stretch 30 sec X 2 on left Supine single leg bridges X 10 on left Supine clams green X 15 bilat Sit to stands no UE support X 10 reps from slightly elevated from standard chair.   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 Discussed importance of improving her hip strength and hip ROM to help her stand and walk better.    PATIENT EDUCATION: Education details: HEP, PT plan of care, self care Person educated: Patient Education method: Explanation, Demonstration, Verbal cues, and Handouts Education comprehension: verbalized understanding and needs further education   HOME EXERCISE PROGRAM: Access Code: [email or web address] URL: [email or web address] Date: 10/11/2023 Prepared by: Jamee Mazzoni  Exercises - Standing Hip Abduction with Counter Support  - 2 x daily - 6 x weekly - 1-2 sets - 10 reps - alternating marching  - 2 x daily - 6 x weekly - 1-2 sets - 10 reps - Standing Hip Extension with Counter Support  - 2 x daily - 6 x weekly - 1-2 sets - 10 reps - Hooklying Single Knee to Chest Stretch (Mirrored)  - 2 x daily - 6 x weekly - 1 sets - 2 reps - 30 sec hold - supine IT band and piriformis  stretch  - 2 x daily - 6 x weekly - 1 sets - 30 sec hold - Sit to Stand  - 2 x daily - 6 x weekly - 2 sets - 5 reps - Standing Tandem Balance with Counter Support  - 2 x daily - 6 x weekly - 1 sets - 3 reps - 20 sec hold  ASSESSMENT:  CLINICAL IMPRESSION: Session focused on HEP review with demonstration and verbal cues provided for proper technique and understanding of exercises. I did need to modify tandem balance from completely tandem to staggered back some stance to reduce the difficulty and allow her to be more successful with this. She will make this modification for home as well.    Per eval: Patient referred to PT for left hip pain and weakness. MD Impression is chronic exacerbation of left hip osteoarthritis with a small effusion noted on ultrasound.  She does have mild osteoarthritic change of both hips as well as some hip weakness bilaterally. Patient will benefit from skilled PT to address below impairments, limitations and improve overall function.  OBJECTIVE IMPAIRMENTS: decreased activity tolerance, difficulty walking, decreased balance, decreased endurance, decreased mobility, decreased ROM, decreased strength, impaired flexibility, impaired UE/LE use, and pain.  ACTIVITY LIMITATIONS: bending, lifting, locomotion, cleaning, community walking  PERSONAL FACTORS: see above PMH, are also affecting patient's functional outcome.  REHAB POTENTIAL: Good  CLINICAL DECISION MAKING: Evolving/moderate complexity  EVALUATION COMPLEXITY: Moderate    GOALS: Short term PT Goals Target date: 11/08/2023   Pt will be I and compliant with HEP. Baseline:  Goal status: New Pt will decrease pain by 25% overall with walking Baseline: Goal status: New  Long term PT goals Target date:12/06/2023   Pt will improve hip ROM to [facility] (hip flexion to 100, hip IR to >10 deg to improve functional mobility Baseline: Goal status: New Pt will improve  hip/knee strength to at least 4+/5 MMT to  improve functional strength Baseline: Goal status: New Pt will improve PSFS to at least 5.5/10 functional to show improved function Baseline: Goal status: New Pt will reduce  pain to overall less than 3/10 with usual activity and walking outside up to 1 mile. Baseline: Goal status: New  PLAN: PT FREQUENCY: 2 times per week   PT DURATION: 6-8 weeks  PLANNED INTERVENTIONS (unless contraindicated): 97750- Physical Performance Testing, 97110-Therapeutic exercises, 97530- Therapeutic activity, [id number]- Neuromuscular re-education, 97535- Self Care, 16109- Manual therapy, [id number]- Gait training, [phone]- Electrical stimulation (unattended), and 97033- Ionotophoresis 4mg /ml Dexamethasone   PLAN FOR NEXT SESSION: focus on left hip strength, gait, balance.    Mick Alamin, PT,DPT 10/14/2023, 12:35 PM

## 2023-10-19 ENCOUNTER — Encounter: Payer: Self-pay | Admitting: Physical Therapy

## 2023-10-19 ENCOUNTER — Ambulatory Visit: Admitting: Physical Therapy

## 2023-10-19 DIAGNOSIS — M6281 Muscle weakness (generalized): Secondary | ICD-10-CM | POA: Diagnosis not present

## 2023-10-19 DIAGNOSIS — R29898 Other symptoms and signs involving the musculoskeletal system: Secondary | ICD-10-CM | POA: Diagnosis not present

## 2023-10-19 DIAGNOSIS — M25552 Pain in left hip: Secondary | ICD-10-CM | POA: Diagnosis not present

## 2023-10-19 DIAGNOSIS — R262 Difficulty in walking, not elsewhere classified: Secondary | ICD-10-CM | POA: Diagnosis not present

## 2023-10-19 DIAGNOSIS — M1612 Unilateral primary osteoarthritis, left hip: Secondary | ICD-10-CM | POA: Diagnosis not present

## 2023-10-19 NOTE — Therapy (Signed)
 OUTPATIENT PHYSICAL THERAPY TREATMENT Referring diagnosis? M16.12 (ICD-10-CM) - Unilateral primary osteoarthritis, left hip Treatment diagnosis? (if different than referring diagnosis) M25.552 What was this (referring dx) caused by? []  Surgery []  Fall [x]  Ongoing issue [x]  Arthritis []  Other: ____________  Laterality: []  Rt [x]  Lt []  Both  Check all possible CPT codes:  *CHOOSE 10 OR LESS*    See Planned Interventions listed in the Plan section of the Evaluation.    Patient Name: LUSIA GREIS MRN: [id number] DOB:1945/10/23, 78 y.o., female Today's Date: 10/19/2023  END OF SESSION:  PT End of Session - 10/19/23 1154     Visit Number 3    Number of Visits 12    Date for PT Re-Evaluation 12/06/23    Authorization Type [employer] MCR    Progress Note Due on Visit 10    PT Start Time 1145    PT Stop Time 1230    PT Time Calculation (min) 45 min    Activity Tolerance Patient tolerated treatment well    Behavior During Therapy WFL for tasks assessed/performed              Past Medical History:  Diagnosis Date   Dysphasia    Endometrial cancer (HCC)    Heel spur    Heparin induced thrombocytopenia (HCC)    Hypercholesteremia    Hypertension    Hypothyroidism    Migraine headache    Osteopenia    RLS (restless legs syndrome)    SVC syndrome    Thyroid  disease    Past Surgical History:  Procedure Laterality Date   ABDOMINAL AORTIC ENDOVASCULAR STENT GRAFT Right 10/19/2021   Procedure: CENTRAL VENOGRAM;  Surgeon: Adine Hoof, MD;  Location: 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     FINGER AMPUTATION     right index finger at knuckle   INSERTION OF ILIAC STENT  Right 10/19/2021   Procedure: INSERTION OF SUBCLAVIAN  STENT;  Surgeon: Adine Hoof, MD;  Location: [facility] OR;  Service: Vascular;  Laterality: Right;   PORT-A-CATH REMOVAL     TONSILLECTOMY     Patient Active Problem List   Diagnosis Date Noted   Unilateral primary osteoarthritis, left hip 09/06/2023   Bilateral pulmonary embolism (HCC) 10/17/2021   Bilateral pleural effusion 10/14/2021   Prolonged QT interval 10/14/2021   Acquired hypothyroidism 10/14/2021   Essential hypertension 10/14/2021   Syncopal episodes 09/27/2021   Subdural hematoma (HCC) 09/27/2021   Acute cystitis without hematuria 07/12/2021   Closed fracture of right distal radius and ulna 02/23/2019   Chronic insomnia 12/15/2018   Restless leg syndrome 12/15/2018   Paresthesia 12/15/2018    PCP: Orlena Bitters, MD   REFERRING PROVIDER:   Shauna Del, DO     REFERRING DIAG:  [phone] (ICD-10-CM) - Unilateral primary osteoarthritis, left hip  R29.898 (ICD-10-CM) - Weakness of both hips    THERAPY DIAG:  Pain in left hip  Muscle weakness (generalized)  Difficulty in walking, not elsewhere classified  Rationale for Evaluation and Treatment: Rehabilitation  ONSET DATE: 1 year of pain  SUBJECTIVE:   SUBJECTIVE STATEMENT: 10/19/23 Pain from all the rain lately, .   Per eval: She relays one year of pain  that has become worse. The pain came on gradually without known cause. She did have hip injection 10/07/23 and reports this has helped with her pain. But still with difficulty walking, getting up from standing, getting in/out of car or bed. Does also relay balance deficits.   PERTINENT HISTORY: Left arm amputation.   PAIN:  NPRS scale: 3-4/10 upon arrival Pain location:Left hip posterior and lateral Pain description: constant ache Aggravating factors: walking, getting leg in/out car, standing after sitting for a while Relieving factors: rest, did not notice any difference from ice, has not  tried heat, can not take NSAIDS due to on blood thinners.    PRECAUTIONS: None  RED FLAGS: None   WEIGHT BEARING RESTRICTIONS: No  FALLS:  Has patient fallen in last 6 months? Yes. Number of falls 1 She does report some trouble with her balance LIVING ENVIRONMENT: No stairs or steps  PLOF: Needs assistance with ADLs due to UE limb loss.   PATIENT GOALS: reduce pain. Walk better  NEXT MD VISIT: 11/11/23  OBJECTIVE:  Note: Objective measures were completed at Evaluation unless otherwise noted.  DIAGNOSTIC FINDINGS: "x-ray shows some sclerotic changes, OA"  PATIENT SURVEYS:  Patient-Specific Activity Scoring Scheme  "0" represents "unable to perform." "10" represents "able to perform at prior level. 0 1 2 3 4 5 6 7 8 9  10 (Date and Score)   Activity Eval     1. Walking outside 4    2. Getting leg in/out of car  3    Score 3.5    Total score = sum of the activity scores/number of activities Minimum detectable change (90%CI) for average score = 2 points Minimum detectable change (90%CI) for single activity score = 3 points  LOWER EXTREMITY ROM:  PROM Right eval Left eval  Hip flexion  95  Hip extension    Hip abduction    Hip adduction    Hip internal rotation  [facility]  Hip external rotation  5  Knee flexion    Knee extension    Ankle dorsiflexion    Ankle plantarflexion    Ankle inversion    Ankle eversion     (Blank rows = not tested)  LOWER EXTREMITY MMT:  MMT Right eval Left eval  Hip flexion 4+ 3  Hip extension 4 3  Hip abduction 4 3  Hip adduction    Hip internal rotation    Hip external rotation    Knee flexion 5 4  Knee extension 5 4  Ankle dorsiflexion    Ankle plantarflexion    Ankle inversion    Ankle eversion     (Blank rows = not tested)   FUNCTIONAL TESTS:  Eval 5 times sit to stand: 35 sec, must use UE to push up  GAIT: Comments: ambulates without assistance but pain and difficulty present   TODAY'S TREATMENT:   10/19/23 Nu step L5 X 5 minutes LE Recumbent bike L1 X 5 min Standing hip abduction red X 15 bilat Standing hip extension red X 15 bilat Standing hip adduction red X 15 bilat Standing hip flexion red X 15 bilat Tandem balance modified 30 sec X 3 bilat Standing marches X 15 bilat on airex pad, one UE support needed Step ups onto airex pad X 10 bilat, one UE support needed Stepping over cones (3 total) lateral X 3 round trips in bars, and stepping over cones forward X 2 round trips Supine SKTC stretch 30 sec X 2 on left Supine Piriformis stretch 30 sec  X 2 on left Supine single leg bridges X 10 on each side, holding 5 sec each Supine clams green X 15 bilat Sit to stands no UE support X 10 reps from slightly elevated from standard chair. Seated LAQ 3# X 15 bilat  10/14/23 Nu step L5 X 10 minutes UE/LE Standing hip abduction red X 15 bilat Standing hip extension red X 15 bilat Standing hip adduction red X 15 bilat Standing hip flexion red X 15 bilat Tandem balance modified 30 sec X 3 bilat Standing marches X 10 bilat on airex pad, one UE support needed Step ups onto airex pad X 10 bilat, one UE support needed Supine SKTC stretch 30 sec X 2 on left Supine Piriformis stretch 30 sec X 2 on left Supine single leg bridges X 10 on left Supine clams green X 15 bilat Sit to stands no UE support X 10 reps from slightly elevated from standard chair.   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 Discussed importance of improving her hip strength and hip ROM to help her stand and walk better.    PATIENT EDUCATION: Education details: HEP, PT plan of care, self care Person educated: Patient Education method: Explanation, Demonstration, Verbal cues, and  Handouts Education comprehension: verbalized understanding and needs further education   HOME EXERCISE PROGRAM: Access Code: [email or web address] URL: [email or web address] Date: 10/11/2023 Prepared by: Jamee Mazzoni  Exercises - Standing Hip Abduction with Counter Support  - 2 x daily - 6 x weekly - 1-2 sets - 10 reps - alternating marching  - 2 x daily - 6 x weekly - 1-2 sets - 10 reps - Standing Hip Extension with Counter Support  - 2 x daily - 6 x weekly - 1-2 sets - 10 reps - Hooklying Single Knee to Chest Stretch (Mirrored)  - 2 x daily - 6 x weekly - 1 sets - 2 reps - 30 sec hold - supine IT band and piriformis stretch  - 2 x daily - 6 x weekly - 1 sets - 30 sec hold - Sit to Stand  - 2 x daily - 6 x weekly - 2 sets - 5 reps - Standing Tandem Balance with Counter Support  - 2 x daily - 6 x weekly - 1 sets - 3 reps - 20 sec hold  ASSESSMENT:  CLINICAL IMPRESSION: She showed good tolerance to exercise progressions today. Her balance showed some improvements so progressed balance challenges today as well. She will continue to benefit from skilled PT.   Per eval: Patient referred to PT for left hip pain and weakness. MD Impression is chronic exacerbation of left hip osteoarthritis with a small effusion noted on ultrasound.  She does have mild osteoarthritic change of both hips as well as some hip weakness bilaterally. Patient will benefit from skilled PT to address below impairments, limitations and improve overall function.  OBJECTIVE IMPAIRMENTS: decreased activity tolerance, difficulty walking, decreased balance, decreased endurance, decreased mobility, decreased ROM, decreased strength, impaired flexibility, impaired UE/LE use, and pain.  ACTIVITY LIMITATIONS: bending, lifting, locomotion, cleaning, community walking  PERSONAL FACTORS: see above PMH, are also affecting patient's functional outcome.  REHAB POTENTIAL: Good  CLINICAL DECISION MAKING: Evolving/moderate  complexity  EVALUATION COMPLEXITY: Moderate    GOALS: Short term PT Goals Target date: 11/08/2023   Pt will be  I and compliant with HEP. Baseline:  Goal status: New Pt will decrease pain by 25% overall with walking Baseline: Goal status: New  Long term PT goals Target date:12/06/2023   Pt will improve hip ROM to [facility] (hip flexion to 100, hip IR to >10 deg to improve functional mobility Baseline: Goal status: New Pt will improve  hip/knee strength to at least 4+/5 MMT to improve functional strength Baseline: Goal status: New Pt will improve PSFS to at least 5.5/10 functional to show improved function Baseline: Goal status: New Pt will reduce pain to overall less than 3/10 with usual activity and walking outside up to 1 mile. Baseline: Goal status: New  PLAN: PT FREQUENCY: 2 times per week   PT DURATION: 6-8 weeks  PLANNED INTERVENTIONS (unless contraindicated): 97750- Physical Performance Testing, 97110-Therapeutic exercises, 97530- Therapeutic activity, [id number]- Neuromuscular re-education, 97535- Self Care, 16109- Manual therapy, [id number]- Gait training, [phone]- Electrical stimulation (unattended), and 97033- Ionotophoresis 4mg /ml Dexamethasone   PLAN FOR NEXT SESSION: focus on left hip strength, gait, balance.    Mick Alamin, PT,DPT 10/19/2023, 11:57 AM

## 2023-10-21 ENCOUNTER — Ambulatory Visit: Admitting: Physical Therapy

## 2023-10-21 ENCOUNTER — Encounter: Payer: Self-pay | Admitting: Physical Therapy

## 2023-10-21 DIAGNOSIS — R262 Difficulty in walking, not elsewhere classified: Secondary | ICD-10-CM

## 2023-10-21 DIAGNOSIS — M6281 Muscle weakness (generalized): Secondary | ICD-10-CM

## 2023-10-21 DIAGNOSIS — M25552 Pain in left hip: Secondary | ICD-10-CM

## 2023-10-21 DIAGNOSIS — M1612 Unilateral primary osteoarthritis, left hip: Secondary | ICD-10-CM | POA: Diagnosis not present

## 2023-10-21 DIAGNOSIS — R29898 Other symptoms and signs involving the musculoskeletal system: Secondary | ICD-10-CM | POA: Diagnosis not present

## 2023-10-21 NOTE — Therapy (Signed)
 OUTPATIENT PHYSICAL THERAPY TREATMENT Referring diagnosis? M16.12 (ICD-10-CM) - Unilateral primary osteoarthritis, left hip Treatment diagnosis? (if different than referring diagnosis) M25.552 What was this (referring dx) caused by? []  Surgery []  Fall [x]  Ongoing issue [x]  Arthritis []  Other: ____________  Laterality: []  Rt [x]  Lt []  Both  Check all possible CPT codes:  *CHOOSE 10 OR LESS*    See Planned Interventions listed in the Plan section of the Evaluation.    Patient Name: [name] MRN: [id number] DOB:1946/02/13, 78 y.o., female Today's Date: 10/21/2023  END OF SESSION:  PT End of Session - 10/21/23 0930     Visit Number 4    Number of Visits 12    Date for PT Re-Evaluation 12/06/23    Authorization Type [employer] MCR    Progress Note Due on Visit 10    PT Start Time 0930    PT Stop Time 1015    PT Time Calculation (min) 45 min    Activity Tolerance Patient tolerated treatment well    Behavior During Therapy WFL for tasks assessed/performed              Past Medical History:  Diagnosis Date   Dysphasia    Endometrial cancer (HCC)    Heel spur    Heparin induced thrombocytopenia (HCC)    Hypercholesteremia    Hypertension    Hypothyroidism    Migraine headache    Osteopenia    RLS (restless legs syndrome)    SVC syndrome    Thyroid  disease    Past Surgical History:  Procedure Laterality Date   ABDOMINAL AORTIC ENDOVASCULAR STENT GRAFT Right 10/19/2021   Procedure: CENTRAL VENOGRAM;  Surgeon: Adine Hoof, MD;  Location: [facility] OR;  Service: Vascular;  Laterality: Right;   ABDOMINAL HYSTERECTOMY     ANGIOPLASTY Right 10/19/2021   Procedure: BALLOON ANGIOPLASTY INTERNAL JUGULAR, INNOMINATE AND SUBCLAVIAN;  Surgeon: Adine Hoof, MD;  Location: [facility] OR;  Service: Vascular;  Laterality: Right;   ANKLE SURGERY Right    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     FINGER AMPUTATION     right index finger at knuckle   INSERTION OF ILIAC STENT  Right 10/19/2021   Procedure: INSERTION OF SUBCLAVIAN  STENT;  Surgeon: Adine Hoof, MD;  Location: [facility] OR;  Service: Vascular;  Laterality: Right;   PORT-A-CATH REMOVAL     TONSILLECTOMY     Patient Active Problem List   Diagnosis Date Noted   Unilateral primary osteoarthritis, left hip 09/06/2023   Bilateral pulmonary embolism (HCC) 10/17/2021   Bilateral pleural effusion 10/14/2021   Prolonged QT interval 10/14/2021   Acquired hypothyroidism 10/14/2021   Essential hypertension 10/14/2021   Syncopal episodes 09/27/2021   Subdural hematoma (HCC) 09/27/2021   Acute cystitis without hematuria 07/12/2021   Closed fracture of right distal radius and ulna 02/23/2019   Chronic insomnia 12/15/2018   Restless leg syndrome 12/15/2018   Paresthesia 12/15/2018    PCP: Orlena Bitters, MD   REFERRING PROVIDER:   Shauna Del, DO     REFERRING DIAG:  [phone] (ICD-10-CM) - Unilateral primary osteoarthritis, left hip  R29.898 (ICD-10-CM) - Weakness of both hips    THERAPY DIAG:  Pain in left hip  Muscle weakness (generalized)  Difficulty in walking, not elsewhere classified  Rationale for Evaluation and Treatment: Rehabilitation  ONSET DATE: 1 year of pain  SUBJECTIVE:   SUBJECTIVE STATEMENT: 10/21/23 Pain is better in her hip today, does relay some pain in her her right hand and  difficulty making a fist, wonders if it is carpal tunnel.   Per eval: She relays one year of pain that has become worse. The pain came on gradually without known cause. She did have hip injection 10/07/23 and reports this has helped with her pain. But still with difficulty walking, getting up from standing, getting in/out of car or bed. Does also relay balance deficits.   PERTINENT HISTORY: Left arm amputation.   PAIN:  NPRS scale: 1-3/10 today Pain location:Left hip posterior and lateral Pain description: constant ache Aggravating factors: walking, getting leg in/out car, standing  after sitting for a while Relieving factors: rest, did not notice any difference from ice, has not tried heat, can not take NSAIDS due to on blood thinners.    PRECAUTIONS: None  RED FLAGS: None   WEIGHT BEARING RESTRICTIONS: No  FALLS:  Has patient fallen in last 6 months? Yes. Number of falls 1 She does report some trouble with her balance LIVING ENVIRONMENT: No stairs or steps  PLOF: Needs assistance with ADLs due to UE limb loss.   PATIENT GOALS: reduce pain. Walk better  NEXT MD VISIT: 11/11/23  OBJECTIVE:  Note: Objective measures were completed at Evaluation unless otherwise noted.  DIAGNOSTIC FINDINGS: "x-ray shows some sclerotic changes, OA"  PATIENT SURVEYS:  Patient-Specific Activity Scoring Scheme  "0" represents "unable to perform." "10" represents "able to perform at prior level. 0 1 2 3 4 5 6 7 8 9  10 (Date and Score)   Activity Eval     1. Walking outside 4    2. Getting leg in/out of car  3    Score 3.5    Total score = sum of the activity scores/number of activities Minimum detectable change (90%CI) for average score = 2 points Minimum detectable change (90%CI) for single activity score = 3 points  LOWER EXTREMITY ROM:  PROM Right eval Left eval Left 10/21/23  Hip flexion  95 100  Hip extension     Hip abduction     Hip adduction     Hip internal rotation  [facility]   Hip external rotation  5 10  Knee flexion     Knee extension     Ankle dorsiflexion     Ankle plantarflexion     Ankle inversion     Ankle eversion      (Blank rows = not tested)  LOWER EXTREMITY MMT:  MMT Right eval Left eval  Hip flexion 4+ 3  Hip extension 4 3  Hip abduction 4 3  Hip adduction    Hip internal rotation    Hip external rotation    Knee flexion 5 4  Knee extension 5 4  Ankle dorsiflexion    Ankle plantarflexion    Ankle inversion    Ankle eversion     (Blank rows = not tested)   FUNCTIONAL TESTS:  Eval 5 times sit to stand: 35 sec, must use  UE to push up  10/21/23 5 times sit to stand: 16 seconds and did not need UE support  GAIT: Comments: ambulates without assistance but pain and difficulty present   TODAY'S TREATMENT:  10/21/23 Nu step L5 X 5 minutes LE Recumbent bike L1 X 5 min Standing hip abduction red X 15 bilat Standing hip extension red X 15 bilat Standing hip adduction red X 15 bilat Standing hip flexion red X 15 bilat Tandem balance modified 30 sec X 3 bilat Standing marches X 15 bilat on airex pad, one UE support  needed Step ups onto airex pad X 10 bilat, one UE support needed Supine SKTC stretch 30 sec X 2 on left Supine Piriformis stretch 30 sec X 2 on left Supine single leg bridges X 10 on each side, holding 5 sec each Supine clams green X 15 bilat 5 times Sit to stand test, see above for details Seated LAQ 3# X 15 bilat Seated SLR 2X10 on left  10/19/23 Nu step L5 X 5 minutes LE Recumbent bike L1 X 5 min Standing hip abduction red X 15 bilat Standing hip extension red X 15 bilat Standing hip adduction red X 15 bilat Standing hip flexion red X 15 bilat Tandem balance modified 30 sec X 3 bilat Standing marches X 15 bilat on airex pad, one UE support needed Step ups onto airex pad X 10 bilat, one UE support needed Stepping over cones (3 total) lateral X 3 round trips in bars, and stepping over cones forward X 2 round trips Supine SKTC stretch 30 sec X 2 on left Supine Piriformis stretch 30 sec X 2 on left Supine single leg bridges X 10 on each side, holding 5 sec each Supine clams green X 15 bilat Sit to stands no UE support X 10 reps from slightly elevated from standard chair. Seated LAQ 3# X 15 bilat  10/14/23 Nu step L5 X 10 minutes UE/LE Standing hip abduction red X 15 bilat Standing hip extension red X 15 bilat Standing hip adduction red X 15 bilat Standing hip flexion red X 15 bilat Tandem balance modified 30 sec X 3 bilat Standing marches X 10 bilat on airex pad, one UE support  needed Step ups onto airex pad X 10 bilat, one UE support needed Supine SKTC stretch 30 sec X 2 on left Supine Piriformis stretch 30 sec X 2 on left Supine single leg bridges X 10 on left Supine clams green X 15 bilat Sit to stands no UE support X 10 reps from slightly elevated from standard chair.    PATIENT EDUCATION: Education details: HEP, PT plan of care, self care Person educated: Patient Education method: Explanation, Demonstration, Verbal cues, and Handouts Education comprehension: verbalized understanding and needs further education   HOME EXERCISE PROGRAM: Access Code: [email or web address] URL: [email or web address] Date: 10/11/2023 Prepared by: Jamee Mazzoni  Exercises - Standing Hip Abduction with Counter Support  - 2 x daily - 6 x weekly - 1-2 sets - 10 reps - alternating marching  - 2 x daily - 6 x weekly - 1-2 sets - 10 reps - Standing Hip Extension with Counter Support  - 2 x daily - 6 x weekly - 1-2 sets - 10 reps - Hooklying Single Knee to Chest Stretch (Mirrored)  - 2 x daily - 6 x weekly - 1 sets - 2 reps - 30 sec hold - supine IT band and piriformis stretch  - 2 x daily - 6 x weekly - 1 sets - 30 sec hold - Sit to Stand  - 2 x daily - 6 x weekly - 2 sets - 5 reps - Standing Tandem Balance with Counter Support  - 2 x daily - 6 x weekly - 1 sets - 3 reps - 20 sec hold  ASSESSMENT:  CLINICAL IMPRESSION: She showed improvements in hip ROM as well as functional 5 times sit to stand test today. She improved from 35 seconds to complete this test at eval to now only 16 seconds! New goal written today to try to improve this to 13 seconds  as research shows >13 seconds increases fall risk.   Per eval: Patient referred to PT for left hip pain and weakness. MD Impression is chronic exacerbation of left hip osteoarthritis with a small effusion noted on ultrasound.  She does have mild osteoarthritic change of both hips as well as some hip weakness bilaterally. Patient will  benefit from skilled PT to address below impairments, limitations and improve overall function.  OBJECTIVE IMPAIRMENTS: decreased activity tolerance, difficulty walking, decreased balance, decreased endurance, decreased mobility, decreased ROM, decreased strength, impaired flexibility, impaired UE/LE use, and pain.  ACTIVITY LIMITATIONS: bending, lifting, locomotion, cleaning, community walking  PERSONAL FACTORS: see above PMH, are also affecting patient's functional outcome.  REHAB POTENTIAL: Good  CLINICAL DECISION MAKING: Evolving/moderate complexity  EVALUATION COMPLEXITY: Moderate    GOALS: Short term PT Goals Target date: 11/08/2023   Pt will be I and compliant with HEP. Baseline:  Goal status: MET 10/21/23 Pt will decrease pain by 25% overall with walking Baseline: Goal status: ongoing 10/21/23  Long term PT goals Target date:12/06/2023   Pt will improve hip ROM to [facility] (hip flexion to 100, hip IR to >10 deg to improve functional mobility Baseline: Goal status: MET 10/21/23 Pt will improve  hip/knee strength to at least 4+/5 MMT to improve functional strength Baseline: Goal status: ongoing 10/21/23 Pt will improve PSFS to at least 5.5/10 functional to show improved function Baseline: Goal status: ongoing 10/21/23 Pt will reduce pain to overall less than 3/10 with usual activity and walking outside up to 1 mile. Baseline: Goal status: ongoing 10/21/23 5. Pt will improve 5 times sit to stand test to less than 13 seconds without UE support to show reduced risk for falling and improved leg endurance  Goal status ongoing 10/21/23  PLAN: PT FREQUENCY: 2 times per week   PT DURATION: 6-8 weeks  PLANNED INTERVENTIONS (unless contraindicated): 97750- Physical Performance Testing, 97110-Therapeutic exercises, 97530- Therapeutic activity, [id number]- Neuromuscular re-education, 97535- Self Care, 16109- Manual therapy, [id number]- Gait training, U0454- Electrical stimulation (unattended),  and 97033- Ionotophoresis 4mg /ml Dexamethasone   PLAN FOR NEXT SESSION: focus on left hip strength, gait, balance.    Mick Alamin, PT,DPT 10/21/2023, 9:30 AM

## 2023-10-23 DIAGNOSIS — I10 Essential (primary) hypertension: Secondary | ICD-10-CM | POA: Diagnosis not present

## 2023-10-26 ENCOUNTER — Encounter: Payer: Self-pay | Admitting: Physical Therapy

## 2023-10-26 ENCOUNTER — Ambulatory Visit: Attending: Sports Medicine | Admitting: Physical Therapy

## 2023-10-26 DIAGNOSIS — M6281 Muscle weakness (generalized): Secondary | ICD-10-CM | POA: Insufficient documentation

## 2023-10-26 DIAGNOSIS — M25552 Pain in left hip: Secondary | ICD-10-CM | POA: Diagnosis not present

## 2023-10-26 DIAGNOSIS — R262 Difficulty in walking, not elsewhere classified: Secondary | ICD-10-CM | POA: Insufficient documentation

## 2023-10-26 NOTE — Therapy (Signed)
 OUTPATIENT PHYSICAL THERAPY TREATMENT Referring diagnosis? M16.12 (ICD-10-CM) - Unilateral primary osteoarthritis, left hip Treatment diagnosis? (if different than referring diagnosis) M25.552 What was this (referring dx) caused by? []  Surgery []  Fall [x]  Ongoing issue [x]  Arthritis []  Other: ____________  Laterality: []  Rt [x]  Lt []  Both  Check all possible CPT codes:  *CHOOSE 10 OR LESS*    See Planned Interventions listed in the Plan section of the Evaluation.    Patient Name: [name] MRN: [id number] DOB:[birth year], 78 y.o., female Today's Date: 10/26/2023  END OF SESSION:  PT End of Session - 10/26/23 1312     Visit Number 5    Number of Visits 12    Date for PT Re-Evaluation 12/06/23    Authorization Type [employer] MCR    Progress Note Due on Visit 10    PT Start Time 1233    PT Stop Time 1325    PT Time Calculation (min) 52 min    Activity Tolerance Patient tolerated treatment well    Behavior During Therapy WFL for tasks assessed/performed              Past Medical History:  Diagnosis Date   Dysphasia    Endometrial cancer (HCC)    Heel spur    Heparin induced thrombocytopenia (HCC)    Hypercholesteremia    Hypertension    Hypothyroidism    Migraine headache    Osteopenia    RLS (restless legs syndrome)    SVC syndrome    Thyroid  disease    Past Surgical History:  Procedure Laterality Date   ABDOMINAL AORTIC ENDOVASCULAR STENT GRAFT Right 10/19/2021   Procedure: CENTRAL VENOGRAM;  Surgeon: Adine Hoof, MD;  Location: [facility] OR;  Service: Vascular;  Laterality: Right;   ABDOMINAL HYSTERECTOMY     ANGIOPLASTY Right 10/19/2021   Procedure: BALLOON ANGIOPLASTY INTERNAL JUGULAR, INNOMINATE AND SUBCLAVIAN;  Surgeon: Adine Hoof, MD;  Location: [facility] OR;  Service: Vascular;  Laterality: Right;   ANKLE SURGERY Right    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     FINGER AMPUTATION     right index finger at knuckle   INSERTION OF ILIAC STENT  Right 10/19/2021   Procedure: INSERTION OF SUBCLAVIAN  STENT;  Surgeon: Adine Hoof, MD;  Location: [facility] OR;  Service: Vascular;  Laterality: Right;   PORT-A-CATH REMOVAL     TONSILLECTOMY     Patient Active Problem List   Diagnosis Date Noted   Unilateral primary osteoarthritis, left hip 09/06/2023   Bilateral pulmonary embolism (HCC) 10/17/2021   Bilateral pleural effusion 10/14/2021   Prolonged QT interval 10/14/2021   Acquired hypothyroidism 10/14/2021   Essential hypertension 10/14/2021   Syncopal episodes 09/27/2021   Subdural hematoma (HCC) 09/27/2021   Acute cystitis without hematuria 07/12/2021   Closed fracture of right distal radius and ulna 02/23/2019   Chronic insomnia 12/15/2018   Restless leg syndrome 12/15/2018   Paresthesia 12/15/2018    PCP: Orlena Bitters, MD   REFERRING PROVIDER:   Shauna Del, DO     REFERRING DIAG:  [phone] (ICD-10-CM) - Unilateral primary osteoarthritis, left hip  R29.898 (ICD-10-CM) - Weakness of both hips    THERAPY DIAG:  Pain in left hip  Muscle weakness (generalized)  Difficulty in walking, not elsewhere classified  Rationale for Evaluation and Treatment: Rehabilitation  ONSET DATE: 1 year of pain  SUBJECTIVE:   SUBJECTIVE STATEMENT: 10/26/23 Relays pain rating is difficult to tell, it depends, states it is "not bad when she is  moving"   Per eval: She relays one year of pain that has become worse. The pain came on gradually without known cause. She did have hip injection 10/07/23 and reports this has helped with her pain. But still with difficulty walking, getting up from standing, getting in/out of car or bed. Does also relay balance deficits.   PERTINENT HISTORY: Left arm amputation.   PAIN:  NPRS scale: does not rate today Pain location:Left hip posterior and lateral Pain description: constant ache Aggravating factors: walking, getting leg in/out car, standing after sitting for a while Relieving  factors: rest, did not notice any difference from ice, has not tried heat, can not take NSAIDS due to on blood thinners.    PRECAUTIONS: None  RED FLAGS: None   WEIGHT BEARING RESTRICTIONS: No  FALLS:  Has patient fallen in last 6 months? Yes. Number of falls 1 She does report some trouble with her balance LIVING ENVIRONMENT: No stairs or steps  PLOF: Needs assistance with ADLs due to UE limb loss.   PATIENT GOALS: reduce pain. Walk better  NEXT MD VISIT: 11/11/23  OBJECTIVE:  Note: Objective measures were completed at Evaluation unless otherwise noted.  DIAGNOSTIC FINDINGS: "x-ray shows some sclerotic changes, OA"  PATIENT SURVEYS:  Patient-Specific Activity Scoring Scheme  "0" represents "unable to perform." "10" represents "able to perform at prior level. 0 1 2 3 4 5 6 7 8 9  10 (Date and Score)   Activity Eval     1. Walking outside 4    2. Getting leg in/out of car  3    Score 3.5    Total score = sum of the activity scores/number of activities Minimum detectable change (90%CI) for average score = 2 points Minimum detectable change (90%CI) for single activity score = 3 points  LOWER EXTREMITY ROM:  PROM Right eval Left eval Left 10/21/23  Hip flexion  95 100  Hip extension     Hip abduction     Hip adduction     Hip internal rotation  [facility]   Hip external rotation  5 10  Knee flexion     Knee extension     Ankle dorsiflexion     Ankle plantarflexion     Ankle inversion     Ankle eversion      (Blank rows = not tested)  LOWER EXTREMITY MMT:  MMT Right eval Left eval  Hip flexion 4+ 3  Hip extension 4 3  Hip abduction 4 3  Hip adduction    Hip internal rotation    Hip external rotation    Knee flexion 5 4  Knee extension 5 4  Ankle dorsiflexion    Ankle plantarflexion    Ankle inversion    Ankle eversion     (Blank rows = not tested)   FUNCTIONAL TESTS:  Eval 5 times sit to stand: 35 sec, must use UE to push up  10/21/23 5 times sit  to stand: 16 seconds and did not need UE support  GAIT: Comments: ambulates without assistance but pain and difficulty present   TODAY'S TREATMENT:  10/26/23 Nu step L5 X 5 minutes LE Standing hip abduction red X 15 bilat Standing hip extension red X 15 bilat Standing hip adduction red X 15 bilat Standing hip flexion red X 15 bilat Tandem balance modified 30 sec X 3 bilat Standing marches X 10 bilat on airex pad, one UE support needed Step ups onto airex pad X 10 bilat, no UE support needed  today Seated leg press machine 10# X 10 reps DL Seated knee extension machine 10# X 10 reps DL Seated hamstring curl machine 10# X 10 reps DL  Manual therapy: massage gun to left hip/gluteal area X 10 min in sideling  Modalities: Moist heat X 9 min to left hip that was not included in billable time.  10/21/23 Nu step L5 X 5 minutes LE Recumbent bike L1 X 5 min Standing hip abduction red X 15 bilat Standing hip extension red X 15 bilat Standing hip adduction red X 15 bilat Standing hip flexion red X 15 bilat Tandem balance modified 30 sec X 3 bilat Standing marches X 15 bilat on airex pad, one UE support needed Step ups onto airex pad X 10 bilat, one UE support needed Supine SKTC stretch 30 sec X 2 on left Supine Piriformis stretch 30 sec X 2 on left Supine single leg bridges X 10 on each side, holding 5 sec each Supine clams green X 15 bilat 5 times Sit to stand test, see above for details Seated LAQ 3# X 15 bilat Seated SLR 2X10 on left  10/19/23 Nu step L5 X 5 minutes LE Recumbent bike L1 X 5 min Standing hip abduction red X 15 bilat Standing hip extension red X 15 bilat Standing hip adduction red X 15 bilat Standing hip flexion red X 15 bilat Tandem balance modified 30 sec X 3 bilat Standing marches X 15 bilat on airex pad, one UE support needed Step ups onto airex pad X 10 bilat, one UE support needed Stepping over cones (3 total) lateral X 3 round trips in bars, and stepping  over cones forward X 2 round trips Supine SKTC stretch 30 sec X 2 on left Supine Piriformis stretch 30 sec X 2 on left Supine single leg bridges X 10 on each side, holding 5 sec each Supine clams green X 15 bilat Sit to stands no UE support X 10 reps from slightly elevated from standard chair. Seated LAQ 3# X 15 bilat    PATIENT EDUCATION: Education details: HEP, PT plan of care, self care Person educated: Patient Education method: Explanation, Demonstration, Verbal cues, and Handouts Education comprehension: verbalized understanding and needs further education   HOME EXERCISE PROGRAM: Access Code: [email or web address] URL: [email or web address] Date: 10/11/2023 Prepared by: Jamee Mazzoni  Exercises - Standing Hip Abduction with Counter Support  - 2 x daily - 6 x weekly - 1-2 sets - 10 reps - alternating marching  - 2 x daily - 6 x weekly - 1-2 sets - 10 reps - Standing Hip Extension with Counter Support  - 2 x daily - 6 x weekly - 1-2 sets - 10 reps - Hooklying Single Knee to Chest Stretch (Mirrored)  - 2 x daily - 6 x weekly - 1 sets - 2 reps - 30 sec hold - supine IT band and piriformis stretch  - 2 x daily - 6 x weekly - 1 sets - 30 sec hold - Sit to Stand  - 2 x daily - 6 x weekly - 2 sets - 5 reps - Standing Tandem Balance with Counter Support  - 2 x daily - 6 x weekly - 1 sets - 3 reps - 20 sec hold  ASSESSMENT:  CLINICAL IMPRESSION: I showed her more gym equipment today that she can use to further improve leg strength. I will monitor for any soreness from this next visit. She does have one visit left scheduled so I will reassesses to see what  her PT plan will be moving forward.    Per eval: Patient referred to PT for left hip pain and weakness. MD Impression is chronic exacerbation of left hip osteoarthritis with a small effusion noted on ultrasound.  She does have mild osteoarthritic change of both hips as well as some hip weakness bilaterally. Patient will benefit from  skilled PT to address below impairments, limitations and improve overall function.  OBJECTIVE IMPAIRMENTS: decreased activity tolerance, difficulty walking, decreased balance, decreased endurance, decreased mobility, decreased ROM, decreased strength, impaired flexibility, impaired UE/LE use, and pain.  ACTIVITY LIMITATIONS: bending, lifting, locomotion, cleaning, community walking  PERSONAL FACTORS: see above PMH, are also affecting patient's functional outcome.  REHAB POTENTIAL: Good  CLINICAL DECISION MAKING: Evolving/moderate complexity  EVALUATION COMPLEXITY: Moderate    GOALS: Short term PT Goals Target date: 11/08/2023   Pt will be I and compliant with HEP. Baseline:  Goal status: MET 10/21/23 Pt will decrease pain by 25% overall with walking Baseline: Goal status: ongoing 10/21/23  Long term PT goals Target date:12/06/2023   Pt will improve hip ROM to [facility] (hip flexion to 100, hip IR to >10 deg to improve functional mobility Baseline: Goal status: MET 10/21/23 Pt will improve  hip/knee strength to at least 4+/5 MMT to improve functional strength Baseline: Goal status: ongoing 10/21/23 Pt will improve PSFS to at least 5.5/10 functional to show improved function Baseline: Goal status: ongoing 10/21/23 Pt will reduce pain to overall less than 3/10 with usual activity and walking outside up to 1 mile. Baseline: Goal status: ongoing 10/21/23 5. Pt will improve 5 times sit to stand test to less than 13 seconds without UE support to show reduced risk for falling and improved leg endurance  Goal status ongoing 10/21/23  PLAN: PT FREQUENCY: 2 times per week   PT DURATION: 6-8 weeks  PLANNED INTERVENTIONS (unless contraindicated): 97750- Physical Performance Testing, 97110-Therapeutic exercises, 97530- Therapeutic activity, [id number]- Neuromuscular re-education, 97535- Self Care, 16109- Manual therapy, [id number]- Gait training, U0454- Electrical stimulation (unattended), and 97033-  Ionotophoresis 4mg /ml Dexamethasone   PLAN FOR NEXT SESSION: one visit left so she what she wants to do moving forward. focus on left hip strength, gait, balance.    Mick Alamin, PT,DPT 10/26/2023, 1:14 PM

## 2023-10-28 ENCOUNTER — Ambulatory Visit: Admitting: Physical Therapy

## 2023-10-28 ENCOUNTER — Encounter: Payer: Self-pay | Admitting: Physical Therapy

## 2023-10-28 DIAGNOSIS — R262 Difficulty in walking, not elsewhere classified: Secondary | ICD-10-CM

## 2023-10-28 DIAGNOSIS — M6281 Muscle weakness (generalized): Secondary | ICD-10-CM | POA: Diagnosis not present

## 2023-10-28 DIAGNOSIS — M25552 Pain in left hip: Secondary | ICD-10-CM | POA: Diagnosis not present

## 2023-10-28 NOTE — Therapy (Addendum)
 OUTPATIENT PHYSICAL THERAPY TREATMENT Referring diagnosis? M16.12 (ICD-10-CM) - Unilateral primary osteoarthritis, left hip Treatment diagnosis? (if different than referring diagnosis) M25.552 What was this (referring dx) caused by? []  Surgery []  Fall [x]  Ongoing issue [x]  Arthritis []  Other: ____________  Laterality: []  Rt [x]  Lt []  Both  Check all possible CPT codes:  *CHOOSE 10 OR LESS*    See Planned Interventions listed in the Plan section of the Evaluation.    Patient Name: [name] MRN: [id number] DOB:1945/12/29, 78 y.o., female Today's Date: 10/28/2023  END OF SESSION:  PT End of Session - 10/28/23 1518     Visit Number 6    Number of Visits 12    Date for PT Re-Evaluation 12/06/23    Authorization Type [employer] MCR    Progress Note Due on Visit 10    PT Start Time 1515    PT Stop Time 1600    PT Time Calculation (min) 45 min    Activity Tolerance Patient tolerated treatment well    Behavior During Therapy WFL for tasks assessed/performed              Past Medical History:  Diagnosis Date   Dysphasia    Endometrial cancer (HCC)    Heel spur    Heparin induced thrombocytopenia (HCC)    Hypercholesteremia    Hypertension    Hypothyroidism    Migraine headache    Osteopenia    RLS (restless legs syndrome)    SVC syndrome    Thyroid  disease    Past Surgical History:  Procedure Laterality Date   ABDOMINAL AORTIC ENDOVASCULAR STENT GRAFT Right 10/19/2021   Procedure: CENTRAL VENOGRAM;  Surgeon: Sheree Penne Bruckner, MD;  Location: [facility] OR;  Service: Vascular;  Laterality: Right;   ABDOMINAL HYSTERECTOMY     ANGIOPLASTY Right 10/19/2021   Procedure: BALLOON ANGIOPLASTY INTERNAL JUGULAR, INNOMINATE AND SUBCLAVIAN;  Surgeon: Sheree Penne Bruckner, MD;  Location: [facility] OR;  Service: Vascular;  Laterality: Right;   ANKLE SURGERY Right    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     FINGER AMPUTATION     right index finger at knuckle   INSERTION OF ILIAC STENT  Right 10/19/2021   Procedure: INSERTION OF SUBCLAVIAN  STENT;  Surgeon: Sheree Penne Bruckner, MD;  Location: [facility] OR;  Service: Vascular;  Laterality: Right;   PORT-A-CATH REMOVAL     TONSILLECTOMY     Patient Active Problem List   Diagnosis Date Noted   Unilateral primary osteoarthritis, left hip 09/06/2023   Bilateral pulmonary embolism (HCC) 10/17/2021   Bilateral pleural effusion 10/14/2021   Prolonged QT interval 10/14/2021   Acquired hypothyroidism 10/14/2021   Essential hypertension 10/14/2021   Syncopal episodes 09/27/2021   Subdural hematoma (HCC) 09/27/2021   Acute cystitis without hematuria 07/12/2021   Closed fracture of right distal radius and ulna 02/23/2019   Chronic insomnia 12/15/2018   Restless leg syndrome 12/15/2018   Paresthesia 12/15/2018    PCP: Rosamond Leta NOVAK, MD   REFERRING PROVIDER:   Burnetta Brunet, DO     REFERRING DIAG:  [phone] (ICD-10-CM) - Unilateral primary osteoarthritis, left hip  R29.898 (ICD-10-CM) - Weakness of both hips    THERAPY DIAG:  Pain in left hip  Muscle weakness (generalized)  Difficulty in walking, not elsewhere classified  Rationale for Evaluation and Treatment: Rehabilitation  ONSET DATE: 1 year of pain  SUBJECTIVE:   SUBJECTIVE STATEMENT: Pain Is only about 1/10 today. She feels better overall and would like to hold PT after today  to trial independent program   PERTINENT HISTORY: Left arm amputation.   PAIN:  NPRS scale: 1/10 Pain location:Left hip posterior and lateral Pain description: constant ache Aggravating factors: walking, getting leg in/out car, standing after sitting for a while Relieving factors: rest, did not notice any difference from ice, has not tried heat, can not take NSAIDS due to on blood thinners.    PRECAUTIONS: None  RED FLAGS: None   WEIGHT BEARING RESTRICTIONS: No  FALLS:  Has patient fallen in last 6 months? Yes. Number of falls 1 She does report some trouble with her  balance LIVING ENVIRONMENT: No stairs or steps  PLOF: Needs assistance with ADLs due to UE limb loss.   PATIENT GOALS: reduce pain. Walk better  NEXT MD VISIT: 11/11/23  OBJECTIVE:  Note: Objective measures were completed at Evaluation unless otherwise noted.  DIAGNOSTIC FINDINGS: x-ray shows some sclerotic changes, OA  PATIENT SURVEYS:  Patient-Specific Activity Scoring Scheme  0 represents "unable to perform." 10 represents "able to perform at prior level. 0 1 2 3 4 5 6 7 8 9  10 (Date and Score)   Activity Eval  10/28/23   1. Walking outside 4 9   2. Getting leg in/out of car  3 8   Score 3.5 8.5   Total score = sum of the activity scores/number of activities Minimum detectable change (90%CI) for average score = 2 points Minimum detectable change (90%CI) for single activity score = 3 points  LOWER EXTREMITY ROM:  PROM Right eval Left eval Left 10/21/23  Hip flexion  95 100  Hip extension     Hip abduction     Hip adduction     Hip internal rotation  [facility]   Hip external rotation  5 10  Knee flexion     Knee extension     Ankle dorsiflexion     Ankle plantarflexion     Ankle inversion     Ankle eversion      (Blank rows = not tested)  LOWER EXTREMITY MMT:  MMT Right eval Left eval Left 10/28/22  Hip flexion 4+ 3 4  Hip extension 4 3 4   Hip abduction 4 3 4   Hip adduction     Hip internal rotation     Hip external rotation     Knee flexion 5 4 4+  Knee extension 5 4 4+  Ankle dorsiflexion     Ankle plantarflexion     Ankle inversion     Ankle eversion      (Blank rows = not tested)   FUNCTIONAL TESTS:  Eval 5 times sit to stand: 35 sec, must use UE to push up  10/21/23 5 times sit to stand: 16 seconds and did not need UE support  10/28/23 5 times sit to stand: 14 seconds and did not need UE support  GAIT: Comments: ambulates without assistance but pain and difficulty present   TODAY'S TREATMENT:  10/28/23 Nu step L5 X 6 minutes  LE Standing hip abduction red X 20 bilat Standing hip extension red X 20 bilat Standing hip adduction red X 20 bilat Standing hip flexion red X 20 bilat Tandem balance modified 30 sec X 3 bilat Standing marches X 10 bilat on airex pad, one UE support needed Step ups onto airex pad X 10 bilat, no UE support needed today Seated leg press machine 10# X 15 reps DL Seated knee extension machine 10# X 15 reps DL Seated hamstring curl machine 10# X 15 reps  DL  Modalities: Moist heat X 15 min to left hip that was not included in billable time.  Selfcare: Education on HEP and independent program trial. Discussed frequency and duration of exercises and walking. Discussed that PT can keep her episode of care open for up to 30 days in the the event she needs to return to PT. Discussed [employer] and other gym memberships in the community that she can use to keep up her strength and progress.  10/26/23 Nu step L5 X 5 minutes LE Standing hip abduction red X 15 bilat Standing hip extension red X 15 bilat Standing hip adduction red X 15 bilat Standing hip flexion red X 15 bilat Tandem balance modified 30 sec X 3 bilat Standing marches X 10 bilat on airex pad, one UE support needed Step ups onto airex pad X 10 bilat, no UE support needed today Seated leg press machine 10# X 10 reps DL Seated knee extension machine 10# X 10 reps DL Seated hamstring curl machine 10# X 10 reps DL  Manual therapy: massage gun to left hip/gluteal area X 10 min in sideling  Modalities: Moist heat X 9 min to left hip that was not included in billable time.  10/21/23 Nu step L5 X 5 minutes LE Recumbent bike L1 X 5 min Standing hip abduction red X 15 bilat Standing hip extension red X 15 bilat Standing hip adduction red X 15 bilat Standing hip flexion red X 15 bilat Tandem balance modified 30 sec X 3 bilat Standing marches X 15 bilat on airex pad, one UE support needed Step ups onto airex pad X 10 bilat, one UE support  needed Supine SKTC stretch 30 sec X 2 on left Supine Piriformis stretch 30 sec X 2 on left Supine single leg bridges X 10 on each side, holding 5 sec each Supine clams green X 15 bilat 5 times Sit to stand test, see above for details Seated LAQ 3# X 15 bilat Seated SLR 2X10 on left  10/19/23 Nu step L5 X 5 minutes LE Recumbent bike L1 X 5 min Standing hip abduction red X 15 bilat Standing hip extension red X 15 bilat Standing hip adduction red X 15 bilat Standing hip flexion red X 15 bilat Tandem balance modified 30 sec X 3 bilat Standing marches X 15 bilat on airex pad, one UE support needed Step ups onto airex pad X 10 bilat, one UE support needed Stepping over cones (3 total) lateral X 3 round trips in bars, and stepping over cones forward X 2 round trips Supine SKTC stretch 30 sec X 2 on left Supine Piriformis stretch 30 sec X 2 on left Supine single leg bridges X 10 on each side, holding 5 sec each Supine clams green X 15 bilat Sit to stands no UE support X 10 reps from slightly elevated from standard chair. Seated LAQ 3# X 15 bilat    PATIENT EDUCATION: Education details: HEP, PT plan of care, self care Person educated: Patient Education method: Explanation, Demonstration, Verbal cues, and Handouts Education comprehension: verbalized understanding and needs further education   HOME EXERCISE PROGRAM: Access Code: [email or web address] URL: [email or web address] Date: 10/11/2023 Prepared by: Redell Moose  Exercises - Standing Hip Abduction with Counter Support  - 2 x daily - 6 x weekly - 1-2 sets - 10 reps - alternating marching  - 2 x daily - 6 x weekly - 1-2 sets - 10 reps - Standing Hip Extension with Counter Support  - 2 x daily -  6 x weekly - 1-2 sets - 10 reps - Hooklying Single Knee to Chest Stretch (Mirrored)  - 2 x daily - 6 x weekly - 1 sets - 2 reps - 30 sec hold - supine IT band and piriformis stretch  - 2 x daily - 6 x weekly - 1 sets - 30 sec hold -  Sit to Stand  - 2 x daily - 6 x weekly - 2 sets - 5 reps - Standing Tandem Balance with Counter Support  - 2 x daily - 6 x weekly - 1 sets - 3 reps - 20 sec hold  ASSESSMENT:  CLINICAL IMPRESSION: She has attended 6 PT visits making fair to good overall progress. She has improved her leg strength, ability to lift her leg up higher to get in/out of car, improved her pain with walking and pain overall. She has met her short term PT goals and 2/5 long term PT goals up to this point. She would now like to trial independent program so I reviewed this with her and I will keep her episode of care open 30 days as she trials independent program.   Per eval: Patient referred to PT for left hip pain and weakness. MD Impression is chronic exacerbation of left hip osteoarthritis with a small effusion noted on ultrasound.  She does have mild osteoarthritic change of both hips as well as some hip weakness bilaterally. Patient will benefit from skilled PT to address below impairments, limitations and improve overall function.  OBJECTIVE IMPAIRMENTS: decreased activity tolerance, difficulty walking, decreased balance, decreased endurance, decreased mobility, decreased ROM, decreased strength, impaired flexibility, impaired UE/LE use, and pain.  ACTIVITY LIMITATIONS: bending, lifting, locomotion, cleaning, community walking  PERSONAL FACTORS: see above PMH, are also affecting patient's functional outcome.  REHAB POTENTIAL: Good  CLINICAL DECISION MAKING: Evolving/moderate complexity  EVALUATION COMPLEXITY: Moderate    GOALS: Short term PT Goals Target date: 11/08/2023   Pt will be I and compliant with HEP. Baseline:  Goal status: MET 10/21/23 Pt will decrease pain by 25% overall with walking Baseline: Goal status: MET 10/28/22  Long term PT goals Target date:12/06/2023   Pt will improve hip ROM to [facility] (hip flexion to 100, hip IR to >10 deg to improve functional mobility Baseline: Goal status: MET  10/21/23 Pt will improve  hip/knee strength to at least 4+/5 MMT to improve functional strength Baseline: Goal status: not MET 10/28/22 Pt will improve PSFS to at least 5.5/10 functional to show improved function Baseline: Goal status: MET 10/28/22 Pt will reduce pain to overall less than 3/10 with usual activity and walking outside up to 1 mile. Baseline: Goal status: not MET 10/28/22 5. Pt will improve 5 times sit to stand test to less than 13 seconds without UE support to show reduced risk for falling and improved leg endurance  Goal status not MET but improved from 35 sec baseline to 14 seconds, 10/28/22  PLAN: PT FREQUENCY: 2 times per week   PT DURATION: 6-8 weeks  PLANNED INTERVENTIONS (unless contraindicated): 97750- Physical Performance Testing, 97110-Therapeutic exercises, 97530- Therapeutic activity, [id number]- Neuromuscular re-education, 97535- Self Care, [id number]- Manual therapy, [id number]- Gait training, H9716- Electrical stimulation (unattended), and 97033- Ionotophoresis 4mg /ml Dexamethasone   PLAN FOR NEXT SESSION: Trial independent program for 30 days and close out after that if no return.  PHYSICAL THERAPY DISCHARGE SUMMARY  Visits from Start of Care: 6  Current functional level related to goals / functional outcomes: See above   Remaining deficits: See above  Education / Equipment: HEP  Plan:  Patient goals were some met. Patient is being discharged due to not returning since last visit >30 days     Redell JONELLE Moose, PT,DPT 10/28/2023, 3:19 PM

## 2023-10-29 ENCOUNTER — Encounter: Admitting: Physical Therapy

## 2023-11-09 ENCOUNTER — Ambulatory Visit: Payer: Medicare PPO | Admitting: Neurology

## 2023-11-11 ENCOUNTER — Ambulatory Visit: Admitting: Sports Medicine

## 2023-11-11 DIAGNOSIS — M1612 Unilateral primary osteoarthritis, left hip: Secondary | ICD-10-CM

## 2023-11-11 DIAGNOSIS — R29898 Other symptoms and signs involving the musculoskeletal system: Secondary | ICD-10-CM

## 2023-11-11 DIAGNOSIS — G2581 Restless legs syndrome: Secondary | ICD-10-CM | POA: Diagnosis not present

## 2023-11-11 NOTE — Progress Notes (Signed)
 Patient says that her hip is doing better. She has finished her appointments with physical therapy, and she does continue to do her exercises at home. She says that her hip will bother her some days depending on how much activity she does. Recently, she walked about a half a mile and felt okay, but the next day walked about a mile and had pain. She says that when her pain returns, she uses a heating pad, which does help.

## 2023-11-11 NOTE — Progress Notes (Signed)
 [name] - 78 y.o. female MRN [id number]  Date of birth: [birth year]  Office Visit Note: Visit Date: 11/11/2023 PCP: Orlena Bitters, MD Referred by: Orlena Bitters, MD  Subjective: Chief Complaint  Patient presents with   Left Hip - Follow-up   HPI: [name] is a pleasant 78 y.o. female who presents today for chronic left hip pain with OA and proximal hip flexor weakness.  Back on 10/07/2023 we did proceed with ultrasound-guided intra-articular hip injection.  She had a few days of soreness but then following this had good relief.  She feels like she is at least 80+ percent improved.  She did have multiple sessions of formalized physical therapy and then was discharged to home exercise regimen.  She is staying consistent with these.  Feels like she may have overdone it slightly this morning.  She is not having as much pain in the groin but still has some in the proximal thigh on the left.  She is using a heating pad which helps.  She has been able to walk about 1/2 mile some days.  She does use Lyrica  75 mg 3 times daily for this and her RLS, which is helpful. Just refilled by PCP on 08/31/23 - note reviewed.  Pertinent ROS were reviewed with the patient and found to be negative unless otherwise specified above in HPI.   Assessment & Plan: Visit Diagnoses:  1. Unilateral primary osteoarthritis, left hip   2. Chronic left hip pain   3. Weakness of both hips   4. Restless leg syndrome    Plan: Impression is chronic left hip pain with mild hip osteoarthritis and a previous small effusion.  This is in the setting of proximal girdle and hip flexor weakness.  Both of these have improved with ultrasound-guided injection and physical therapy.  She still has some room to improve with her hip and girdle strengthening, recommended she continue her exercises/therapy at home at least 3 times weekly for the next 6-8 weeks.  She may continue her Lyrica  75 mg twice daily-3 times daily for this and her  RLS.  Will continue heat for the muscles if painful.  I would like to see her back in about 2 months and reevaluate.  Would like to ultrasound the hip at that time to ensure there is no reaccumulation of her hip effusion.  She may call or return sooner if any issues arise.  Follow-up: Return in about 2 months (around 01/11/2024) for L-hip f/u   Meds & Orders: No orders of the defined types were placed in this encounter.  No orders of the defined types were placed in this encounter.    Procedures: No procedures performed      Clinical History: No specialty comments available.  She reports that she has never smoked. She has never been exposed to tobacco smoke. She has never used smokeless tobacco. No results for input(s): HGBA1C, LABURIC in the last 8760 hours.  Objective:    Physical Exam  Gen: Well-appearing, in no acute distress; non-toxic CV: Well-perfused. Warm.  Resp: Breathing unlabored on room air; no wheezing. Psych: Fluid speech in conversation; appropriate affect; normal thought process  Ortho Exam - Bilateral hips: There is no redness swelling or notable effusion.  The left hip has about 5 degrees less of internal logroll compared to the right hip.  Mildly positive FADIR test on the left, negative on the right.  There is improved hip flexion strength but still about 4/5  strength.  She is able to perform 3 SLR leg raises on the right before fatigability and pain sets in.  Imaging: N/a  Past Medical/Family/Surgical/Social History: Medications & Allergies reviewed per EMR, new medications updated. Patient Active Problem List   Diagnosis Date Noted   Unilateral primary osteoarthritis, left hip 09/06/2023   Bilateral pulmonary embolism (HCC) 10/17/2021   Bilateral pleural effusion 10/14/2021   Prolonged QT interval 10/14/2021   Acquired hypothyroidism 10/14/2021   Essential hypertension 10/14/2021   Syncopal episodes 09/27/2021   Subdural hematoma (HCC) 09/27/2021    Acute cystitis without hematuria 07/12/2021   Closed fracture of right distal radius and ulna 02/23/2019   Chronic insomnia 12/15/2018   Restless leg syndrome 12/15/2018   Paresthesia 12/15/2018   Past Medical History:  Diagnosis Date   Dysphasia    Endometrial cancer (HCC)    Heel spur    Heparin induced thrombocytopenia (HCC)    Hypercholesteremia    Hypertension    Hypothyroidism    Migraine headache    Osteopenia    RLS (restless legs syndrome)    SVC syndrome    Thyroid  disease    Family History  Problem Relation Age of Onset   Stroke Mother    Transient ischemic attack Father    Dementia Father    Pneumonia Father    Diabetes Sister    Rheum arthritis Sister    Brain cancer Brother    Breast cancer Maternal Grandmother    Stroke Paternal Grandmother    Past Surgical History:  Procedure Laterality Date   ABDOMINAL AORTIC ENDOVASCULAR STENT GRAFT Right 10/19/2021   Procedure: CENTRAL VENOGRAM;  Surgeon: Adine Hoof, MD;  Location: [facility] OR;  Service: Vascular;  Laterality: Right;   ABDOMINAL HYSTERECTOMY     ANGIOPLASTY Right 10/19/2021   Procedure: BALLOON ANGIOPLASTY INTERNAL JUGULAR, INNOMINATE AND SUBCLAVIAN;  Surgeon: Adine Hoof, MD;  Location: [facility] OR;  Service: Vascular;  Laterality: Right;   ANKLE SURGERY Right    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     FINGER AMPUTATION     right index finger at knuckle   INSERTION OF ILIAC STENT Right 10/19/2021   Procedure: INSERTION OF SUBCLAVIAN  STENT;  Surgeon: Adine Hoof, MD;  Location: [facility] OR;  Service: Vascular;  Laterality: Right;   PORT-A-CATH REMOVAL     TONSILLECTOMY     Social History   Occupational History   Occupation: Retired Runner, broadcasting/film/video  Tobacco Use   Smoking status: Never    Passive exposure: Never   Smokeless tobacco: Never  Vaping Use   Vaping status: Never Used  Substance and Sexual Activity   Alcohol  use: No   Drug use: No   Sexual activity: Not on file

## 2023-11-22 DIAGNOSIS — I10 Essential (primary) hypertension: Secondary | ICD-10-CM | POA: Diagnosis not present

## 2023-12-23 DIAGNOSIS — I10 Essential (primary) hypertension: Secondary | ICD-10-CM | POA: Diagnosis not present

## 2024-01-06 DIAGNOSIS — E039 Hypothyroidism, unspecified: Secondary | ICD-10-CM | POA: Diagnosis not present

## 2024-01-06 DIAGNOSIS — Z299 Encounter for prophylactic measures, unspecified: Secondary | ICD-10-CM | POA: Diagnosis not present

## 2024-01-06 DIAGNOSIS — I10 Essential (primary) hypertension: Secondary | ICD-10-CM | POA: Diagnosis not present

## 2024-01-06 DIAGNOSIS — Z0000 Encounter for general adult medical examination without abnormal findings: Secondary | ICD-10-CM | POA: Diagnosis not present

## 2024-01-06 DIAGNOSIS — E7800 Pure hypercholesterolemia, unspecified: Secondary | ICD-10-CM | POA: Diagnosis not present

## 2024-01-06 DIAGNOSIS — R5383 Other fatigue: Secondary | ICD-10-CM | POA: Diagnosis not present

## 2024-01-07 DIAGNOSIS — E039 Hypothyroidism, unspecified: Secondary | ICD-10-CM | POA: Diagnosis not present

## 2024-01-07 DIAGNOSIS — Z79899 Other long term (current) drug therapy: Secondary | ICD-10-CM | POA: Diagnosis not present

## 2024-01-07 DIAGNOSIS — R5383 Other fatigue: Secondary | ICD-10-CM | POA: Diagnosis not present

## 2024-01-07 DIAGNOSIS — E7800 Pure hypercholesterolemia, unspecified: Secondary | ICD-10-CM | POA: Diagnosis not present

## 2024-01-11 ENCOUNTER — Ambulatory Visit: Admitting: Sports Medicine

## 2024-01-11 ENCOUNTER — Other Ambulatory Visit: Payer: Self-pay

## 2024-01-11 ENCOUNTER — Encounter: Payer: Self-pay | Admitting: Sports Medicine

## 2024-01-11 DIAGNOSIS — M24052 Loose body in left hip: Secondary | ICD-10-CM

## 2024-01-11 DIAGNOSIS — M1612 Unilateral primary osteoarthritis, left hip: Secondary | ICD-10-CM

## 2024-01-11 DIAGNOSIS — M25452 Effusion, left hip: Secondary | ICD-10-CM | POA: Diagnosis not present

## 2024-01-11 DIAGNOSIS — G2581 Restless legs syndrome: Secondary | ICD-10-CM

## 2024-01-11 MED ORDER — METHYLPREDNISOLONE ACETATE 40 MG/ML IJ SUSP
40.0000 mg | INTRAMUSCULAR | Status: AC | PRN
  Administered 2024-01-11: 40 mg via INTRA_ARTICULAR

## 2024-01-11 MED ORDER — LIDOCAINE HCL 1 % IJ SOLN
4.0000 mL | INTRAMUSCULAR | Status: AC | PRN
  Administered 2024-01-11: 4 mL

## 2024-01-11 NOTE — Progress Notes (Signed)
 [name] - 78 y.o. female MRN [id number]  Date of birth: 1945/06/12  Office Visit Note: Visit Date: 01/11/2024 PCP: Rosamond Leta NOVAK, MD Referred by: Rosamond Leta NOVAK, MD  Subjective: Chief Complaint  Patient presents with   Left Hip - Follow-up   HPI: [name] is a pleasant 78 y.o. female who presents today for follow-up of acute on chronic left hip pain.  We did perform ultrasound-guided left hip intra-articular injection on 09/27/2023. This gave her near complete relief of her pain for about 6 weeks or so.  However, starting in July her pain started to return.  Her pain is almost as bad as it was previously.  She does have pain over the anterior hip and within the groin.  She is using Lyrica  75 mg BID times daily (75mg  AM, 150 mg at bedtime) for this and her restless leg syndrome and to help her sleep.  She is interested in moving forward with something that would be curative for the hip.  Pertinent ROS were reviewed with the patient and found to be negative unless otherwise specified above in HPI.   Assessment & Plan: Visit Diagnoses:  1. Unilateral primary osteoarthritis, left hip   2. Effusion of left hip   3. Restless leg syndrome   4. Loose body in left hip    Plan: Impression is acute exacerbation of chronic left hip pain with associated hip effusion.  Back in May we did perform ultrasound-guided intra-articular hip injection, this gave her significant (although not complete) relief of her pain for about 6 weeks, however her pain then started to return and now is quite bothersome.  She has required the use of her cane with ambulation secondary to pain.  She also has pain with laying on that side of the hip.  She has pain over the anterior and posterior aspect of the hip does have pain laterally with laying only.  Tylenol arthritis has not been significantly helpful.  She is continuing her Lyrica  75 mg a.m., 150 mg twice daily to help with her pain when sleeping and with her RLS.   She is fine to continue this.  We discussed diagnostic and treatment options.  We did proceed with ultrasound-guided hip aspiration and subsequent injection to see if this gives her more significant and long-lasting relief with associated aspiration.  Both x-rays and ultrasound do show a hyperechoic lesion that seems to be within the hip joint that may be indicative of intra-articular loose body versus chondrocalcinosis.  I also would like to rule out acute insufficiency fracture.  Given this, we will move forward with MRI of the left hip which would need to be at least 2 weeks from today's injection for better visualization.  She will follow-up with me 1 week after MRI to review and discuss next steps.  Follow-up: Return for f/u 1-week after hip MRI to review and discuss next steps.   Meds & Orders: No orders of the defined types were placed in this encounter.   Orders Placed This Encounter  Procedures   Large Joint Inj   US  Extrem Low Left Ltd   MR Hip Left w/o contrast     Procedures: Large Joint Inj: L hip joint on 01/11/2024 2:04 PM Indications: pain Details: 22 G 3.5 in needle, anterior approach Medications: 4 mL lidocaine  1 %; 40 mg methylPREDNISolone  acetate 40 MG/ML Aspirate: 3 mL clear and blood-tinged Outcome: tolerated well, no immediate complications  Procedure: US -guided intra-articular hip aspiration, Left  After discussion on risks/benefits/indications and informed verbal consent was obtained, a timeout was performed. Patient was lying supine on exam table. The hip was cleaned with chloraprep and alcohol  swabs. Then utilizing ultrasound guidance, the patient's femoral head and neck junction was identified.  The area was scanned medially and laterally for any joint effusion, although no clear effusion present.  The overlying soft tissue was anesthetized with 5 cc of lidocaine  1%.  Then using ultrasound guidance via an in-plane approach, a 22-gauge 3.5 needle was inserted into the  femoral head and neck junction and into the hip effusion, aspiration yielded 3 cc of a clear yellow, blood-tinged fluid. Following this, a sterile syringe swap was performed and the hip joint was subsequently injected in the same portal with a 2:1 lidocaine :methylprednisolone  injection. No bleeding after this, band-aid applied.  Patient tolerated procedure well without immediate complications.   Procedure, treatment alternatives, risks and benefits explained, specific risks discussed. Consent was given by the patient. Immediately prior to procedure a time out was called to verify the correct patient, procedure, equipment, support staff and site/side marked as required. Patient was prepped and draped in the usual sterile fashion.          Clinical History: No specialty comments available.  She reports that she has never smoked. She has never been exposed to tobacco smoke. She has never used smokeless tobacco. No results for input(s): HGBA1C, LABURIC in the last 8760 hours.  Objective:    Physical Exam  Gen: Well-appearing, in no acute distress; non-toxic CV: Well-perfused. Warm.  Resp: Breathing unlabored on room air; no wheezing. Psych: Fluid speech in conversation; appropriate affect; normal thought process  Ortho Exam - Left hip: There is a small effusion about the hip joint without significant redness or warmth. There is ecchymosis over the left lateral lower flank region (from reported fall).  There is limitation with endrange internal logroll, positive FADIR and Stinchfield testing.  Imaging:  -  2-view (AP, lateral) X-rays of the left hip show mild osteoarthritis of bilateral hips.  The left hip does have some calcification over the superior lateral aspect of the joint, possibly indicative of either chondrocalcinosis or possible intra-articular loose body.  There is no acute fracture of the hip noted.2 view hip x-ray from 09/06/2023 was independently reviewed and interpreted by  myself today.   US  Extrem Low Left Ltd Result Date: 01/11/2024 Limited musculoskeletal ultrasound of the left hip was performed today.  There is evidence of a small to moderate hip joint effusion present.  There is mild flattening of the femoral head and neck junction indicative of arthritic change.  There is a hyperechoic area within the hip capsule which may be a floating cartilage piece/loose body.    Past Medical/Family/Surgical/Social History: Medications & Allergies reviewed per EMR, new medications updated. Patient Active Problem List   Diagnosis Date Noted   Unilateral primary osteoarthritis, left hip 09/06/2023   Bilateral pulmonary embolism (HCC) 10/17/2021   Bilateral pleural effusion 10/14/2021   Prolonged QT interval 10/14/2021   Acquired hypothyroidism 10/14/2021   Essential hypertension 10/14/2021   Syncopal episodes 09/27/2021   Subdural hematoma (HCC) 09/27/2021   Acute cystitis without hematuria 07/12/2021   Closed fracture of right distal radius and ulna 02/23/2019   Chronic insomnia 12/15/2018   Restless leg syndrome 12/15/2018   Paresthesia 12/15/2018   Past Medical History:  Diagnosis Date   Dysphasia    Endometrial cancer (HCC)    Heel spur    Heparin induced thrombocytopenia (  HCC)    Hypercholesteremia    Hypertension    Hypothyroidism    Migraine headache    Osteopenia    RLS (restless legs syndrome)    SVC syndrome    Thyroid  disease    Family History  Problem Relation Age of Onset   Stroke Mother    Transient ischemic attack Father    Dementia Father    Pneumonia Father    Diabetes Sister    Rheum arthritis Sister    Brain cancer Brother    Breast cancer Maternal Grandmother    Stroke Paternal Grandmother    Past Surgical History:  Procedure Laterality Date   ABDOMINAL AORTIC ENDOVASCULAR STENT GRAFT Right 10/19/2021   Procedure: CENTRAL VENOGRAM;  Surgeon: Sheree Penne Bruckner, MD;  Location: [facility] OR;  Service: Vascular;   Laterality: Right;   ABDOMINAL HYSTERECTOMY     ANGIOPLASTY Right 10/19/2021   Procedure: BALLOON ANGIOPLASTY INTERNAL JUGULAR, INNOMINATE AND SUBCLAVIAN;  Surgeon: Sheree Penne Bruckner, MD;  Location: [facility] OR;  Service: Vascular;  Laterality: Right;   ANKLE SURGERY Right    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     FINGER AMPUTATION     right index finger at knuckle   INSERTION OF ILIAC STENT Right 10/19/2021   Procedure: INSERTION OF SUBCLAVIAN  STENT;  Surgeon: Sheree Penne Bruckner, MD;  Location: [facility] OR;  Service: Vascular;  Laterality: Right;   PORT-A-CATH REMOVAL     TONSILLECTOMY     Social History   Occupational History   Occupation: Retired Runner, broadcasting/film/video  Tobacco Use   Smoking status: Never    Passive exposure: Never   Smokeless tobacco: Never  Vaping Use   Vaping status: Never Used  Substance and Sexual Activity   Alcohol  use: No   Drug use: No   Sexual activity: Not on file

## 2024-01-11 NOTE — Progress Notes (Signed)
 Patient says that her pain began to return around July 1st, and has gradually gotten worse in the time since then. She says that she cannot lay on her left side for extended periods of time without the lateral hip pain returning. She does also experience anterior and posterior hip pain, depending on what she is doing at the time. The anterior hip pain seems to worsen when she is seated. She does not take medication, although she did take one Tylenol Arthritis this morning with no noticeable relief. She has been using her cane again due to her increased pain.

## 2024-01-21 ENCOUNTER — Encounter: Payer: Self-pay | Admitting: Sports Medicine

## 2024-01-22 DIAGNOSIS — I10 Essential (primary) hypertension: Secondary | ICD-10-CM | POA: Diagnosis not present

## 2024-01-27 ENCOUNTER — Other Ambulatory Visit: Payer: Self-pay

## 2024-01-27 ENCOUNTER — Ambulatory Visit
Admission: RE | Admit: 2024-01-27 | Discharge: 2024-01-27 | Disposition: A | Discharge status: Home / Self Care | Source: Ambulatory Visit | Attending: Sports Medicine | Admitting: Sports Medicine

## 2024-01-27 DIAGNOSIS — M25452 Effusion, left hip: Secondary | ICD-10-CM

## 2024-01-27 DIAGNOSIS — M1612 Unilateral primary osteoarthritis, left hip: Secondary | ICD-10-CM

## 2024-01-27 DIAGNOSIS — G2581 Restless legs syndrome: Secondary | ICD-10-CM

## 2024-01-27 DIAGNOSIS — M25552 Pain in left hip: Secondary | ICD-10-CM | POA: Diagnosis not present

## 2024-01-27 DIAGNOSIS — R202 Paresthesia of skin: Secondary | ICD-10-CM

## 2024-01-27 DIAGNOSIS — M24052 Loose body in left hip: Secondary | ICD-10-CM

## 2024-01-27 MED ORDER — PREGABALIN 75 MG PO CAPS: 75.0000 mg | ORAL_CAPSULE | Freq: Three times a day (TID) | ORAL | 1 refills | Status: AC

## 2024-01-27 NOTE — Telephone Encounter (Signed)
 Requested Prescriptions   Pending Prescriptions Disp Refills   pregabalin  (LYRICA ) 75 MG capsule 270 capsule 1    Sig: Take 1 capsule (75 mg total) by mouth 3 (three) times daily.   Last seen 08/31/23 Next appt 09/05/24  Dispenses   Dispensed Days Supply Quantity Provider Pharmacy  pregabalin  75 mg capsule 11/16/2023 90 270 capsule Gayland Lauraine PARAS, NP [facility]...  pregabalin  75 mg capsule 09/09/2023 90 270 capsule Gayland Lauraine PARAS, NP [facility]...  pregabalin  75 mg capsule 08/16/2023 30 90 capsule Gayland Lauraine PARAS, NP [facility]  pregabalin  75 mg capsule 07/19/2023 30 90 capsule Gayland Lauraine PARAS, NP [facility]  pregabalin  75 mg capsule 06/21/2023 30 90 capsule Gayland Lauraine PARAS, NP [facility]  pregabalin  75 mg capsule 05/24/2023 30 90 capsule Gayland Lauraine PARAS, [facility]  pregabalin  75 mg capsule 04/20/2023 30 90 capsule Gayland Lauraine PARAS, NP [facility]  pregabalin  75 mg capsule 03/19/2023 30 90 capsule Gayland Lauraine PARAS, [facility]  pregabalin  75 mg capsule 02/19/2023 30 90 capsule Gayland Lauraine PARAS, NP [facility][email or web address]

## 2024-02-04 ENCOUNTER — Ambulatory Visit: Admitting: Sports Medicine

## 2024-02-04 ENCOUNTER — Encounter: Payer: Self-pay | Admitting: Sports Medicine

## 2024-02-04 DIAGNOSIS — M24052 Loose body in left hip: Secondary | ICD-10-CM | POA: Diagnosis not present

## 2024-02-04 DIAGNOSIS — M1612 Unilateral primary osteoarthritis, left hip: Secondary | ICD-10-CM

## 2024-02-04 DIAGNOSIS — M25452 Effusion, left hip: Secondary | ICD-10-CM | POA: Diagnosis not present

## 2024-02-04 NOTE — Progress Notes (Signed)
 [name] - 78 y.o. female MRN [id number]  Date of birth: [birth year]  Office Visit Note: Visit Date: 02/04/2024 PCP: Rosamond Leta NOVAK, MD Referred by: Rosamond Leta NOVAK, MD  Subjective: Chief Complaint  Patient presents with   Left Hip - Follow-up   HPI: [name] is a pleasant 78 y.o. female who presents today for follow-up of acute on chronic left hip pain with hip effusion, MRI review.  About 1 month ago, we did proceed with hip aspiration and subsequent injection.  This gave her about 80-90% relief for the first 1 to 2 weeks, but then after 2 weeks her pain gradually started returning.  It is just as significant as it was prior to our procedure.  We did perform the same thing with hip aspiration and injection back in June which gave her about a month of the same amount of relief but then her pain keeps returning.  She did have an MRI as well that showed reoccurrence of her hip effusion as well as a 9 mm loose body with areas of cartilage loss.  Her pain is rather bothersome that it is keeping her from sleeping and preventing her from ADLs.  She is interested in next steps and even surgical intervention.  She has not received much relief from Tylenol, topical medications, she is on Lyrica  for RLS.  She does have a history of bilateral pulmonary embolism, she is on Eliquis  5 mg twice daily for this.  -Non-smoker, no alcohol  use - No history of CAD - Nondiabetic  Lab Results  Component Value Date   HGBA1C 5.5 12/15/2018   Pertinent ROS were reviewed with the patient and found to be negative unless otherwise specified above in HPI.   Assessment & Plan: Visit Diagnoses:  1. Unilateral primary osteoarthritis, left hip   2. Effusion of left hip   3. Loose body in left hip    Plan: Impression is acute on chronic left hip pain with exacerbation and recurrence of hip effusion.  She has received weeks to 1 month of very good relief of her hip pain from previous aspiration and  injection x 2 but her pain continues to recur.  She has failed Tylenol, topical medications and physical therapy.  MRI does show a hip effusion, areas of high-grade cartilage loss with a loose body, however no advanced degenerative change.  She is interested in surgical intervention, I did discuss the 2 options being hip arthroscopy with removal of loose body and further cartilage evaluation versus simply moving to a total hip arthroplasty. Given the body edema within the femoral head 2/2 high grade cartilage loss with at least moderate OA, I think total hip replacement would be her best bet. I did discuss this case with our hip surgeon, Dr. Vernetta, appreciate his recs.  She will continue with Tylenol in the interim.  I will send referral to our hip surgeon to discuss THA and/or other surgical options.  Meds & Orders: No orders of the defined types were placed in this encounter.  No orders of the defined types were placed in this encounter.    Procedures: No procedures performed      Clinical History: No specialty comments available.  She reports that she has never smoked. She has never been exposed to tobacco smoke. She has never used smokeless tobacco. No results for input(s): HGBA1C, LABURIC in the last 8760 hours.  Objective:    Physical Exam  Gen: Well-appearing, in no acute distress; non-toxic  CV: Well-perfused. Warm.  Resp: Breathing unlabored on room air; no wheezing. Psych: Fluid speech in conversation; appropriate affect; normal thought process  Ortho Exam - Left hip: There is restriction with associated pain with internal logroll, not specifically with external logroll.  Positive FADIR test.  Imaging:  MR Hip Left w/o contrast CLINICAL DATA:  Hip pain, chronic, articular cartilage eval, xray done  Unilateral primary osteoarthritis. No known injury or prior relevant surgery. Left hip aspiration and injection 01/11/2024.  EXAM: MR OF THE LEFT HIP WITHOUT  CONTRAST  TECHNIQUE: Multiplanar, multisequence MR imaging was performed. No intravenous contrast was administered.  COMPARISON:  Radiographs 09/06/2023. Images obtained during ultrasound guided hip aspiration 01/11/2024.  FINDINGS: Bones: There is no evidence of acute fracture, dislocation or femoral head osteonecrosis. There is asymmetric patchy bone marrow edema within the left femoral head and acetabulum which is likely arthropathic. No discrete erosive changes are identified. The visualized sacroiliac joints and symphysis pubis appear normal.  Articular cartilage and labrum  Articular cartilage: Asymmetric left hip chondral thinning with asymmetric subchondral edema in the femoral head and acetabulum.  Labrum: Left acetabular labral degeneration without gross tear.  Joint or bursal effusion  Joint effusion: Moderate-sized left hip joint effusion with mild synovial irregularity. Possible T2 hypointense loose body or focal synovitis anteriorly in the joint, measuring up to 9 mm on sagittal image 12/5. No significant right hip joint effusion.  Bursae: No focal periarticular fluid collection on the left. There is a small amount of fluid within the right iliopsoas bursa.  Muscles and tendons  Muscles and tendons: The visualized gluteus, hamstring and iliopsoas tendons appear normal. As above, small amount of fluid within the right iliopsoas bursa. Mild atrophy of the left gluteus minimus muscle.  Other findings  Miscellaneous: Status post hysterectomy. Small right-sided bladder diverticulum. The visualized internal pelvic contents are otherwise unremarkable.  IMPRESSION: 1. Moderate-sized left hip joint effusion with mild synovial irregularity and possible loose body or focal synovitis anteriorly in the joint. 2. Asymmetric left hip chondral thinning with asymmetric subchondral edema in the femoral head and acetabulum. No discrete erosive changes identified.  Correlate with recent aspiration results to exclude infection. 3. Left acetabular labral degeneration without gross tear. 4. Small amount of fluid within the right iliopsoas bursa. 5. No acute osseous findings.  Electronically Signed   By: Elsie Perone M.D.   On: 02/03/2024 17:19  Past Medical/Family/Surgical/Social History: Medications & Allergies reviewed per EMR, new medications updated. Patient Active Problem List   Diagnosis Date Noted   Unilateral primary osteoarthritis, left hip 09/06/2023   Bilateral pulmonary embolism (HCC) 10/17/2021   Bilateral pleural effusion 10/14/2021   Prolonged QT interval 10/14/2021   Acquired hypothyroidism 10/14/2021   Essential hypertension 10/14/2021   Syncopal episodes 09/27/2021   Subdural hematoma (HCC) 09/27/2021   Acute cystitis without hematuria 07/12/2021   Closed fracture of right distal radius and ulna 02/23/2019   Chronic insomnia 12/15/2018   Restless leg syndrome 12/15/2018   Paresthesia 12/15/2018   Past Medical History:  Diagnosis Date   Dysphasia    Endometrial cancer (HCC)    Heel spur    Heparin induced thrombocytopenia (HCC)    Hypercholesteremia    Hypertension    Hypothyroidism    Migraine headache    Osteopenia    RLS (restless legs syndrome)    SVC syndrome    Thyroid  disease    Family History  Problem Relation Age of Onset   Stroke Mother    Transient  ischemic attack Father    Dementia Father    Pneumonia Father    Diabetes Sister    Rheum arthritis Sister    Brain cancer Brother    Breast cancer Maternal Grandmother    Stroke Paternal Grandmother    Past Surgical History:  Procedure Laterality Date   ABDOMINAL AORTIC ENDOVASCULAR STENT GRAFT Right 10/19/2021   Procedure: CENTRAL VENOGRAM;  Surgeon: Sheree Penne Bruckner, MD;  Location: [facility] OR;  Service: Vascular;  Laterality: Right;   ABDOMINAL HYSTERECTOMY     ANGIOPLASTY Right 10/19/2021   Procedure: BALLOON ANGIOPLASTY INTERNAL JUGULAR,  INNOMINATE AND SUBCLAVIAN;  Surgeon: Sheree Penne Bruckner, MD;  Location: [facility] OR;  Service: Vascular;  Laterality: Right;   ANKLE SURGERY Right    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     FINGER AMPUTATION     right index finger at knuckle   INSERTION OF ILIAC STENT Right 10/19/2021   Procedure: INSERTION OF SUBCLAVIAN  STENT;  Surgeon: Sheree Penne Bruckner, MD;  Location: [facility] OR;  Service: Vascular;  Laterality: Right;   PORT-A-CATH REMOVAL     TONSILLECTOMY     Social History   Occupational History   Occupation: Retired Runner, broadcasting/film/video  Tobacco Use   Smoking status: Never    Passive exposure: Never   Smokeless tobacco: Never  Vaping Use   Vaping status: Never Used  Substance and Sexual Activity   Alcohol  use: No   Drug use: No   Sexual activity: Not on file   I spent 35 minutes in the care of the patient today including face-to-face time, preparation to see the patient, as well as independent review and interpretation of left hip MRI, review of MRI with the patient and her husband in the room today, discussion on possible arthroscopic versus hip replacement surgery that could be performed by one of my orthopedic colleagues and general overview and expected recovery, separate discussion with my hip surgeon, Dr. Vernetta regarding the above diagnoses.   Lonell Sprang, DO Primary Care Sports Medicine Physician  [facility] - Orthopedics  This note was dictated using Dragon naturally speaking software and may contain errors in syntax, spelling, or content which have not been identified prior to signing this note.

## 2024-02-04 NOTE — Progress Notes (Signed)
 Patient says that she got about 80% relief from the injection for the first week, and by the second week her pain was gradually returning. She says it feels about the same now as it did prior to the injection, and she struggles to find a position that is comfortable enough for sleep. She is here today for MRI review.

## 2024-02-06 ENCOUNTER — Other Ambulatory Visit: Payer: Self-pay | Admitting: Sports Medicine

## 2024-02-06 DIAGNOSIS — M1612 Unilateral primary osteoarthritis, left hip: Secondary | ICD-10-CM

## 2024-03-01 ENCOUNTER — Ambulatory Visit: Admitting: Orthopaedic Surgery

## 2024-03-01 DIAGNOSIS — M1612 Unilateral primary osteoarthritis, left hip: Secondary | ICD-10-CM | POA: Diagnosis not present

## 2024-03-01 DIAGNOSIS — M25452 Effusion, left hip: Secondary | ICD-10-CM

## 2024-03-01 NOTE — Progress Notes (Signed)
    The patient is a 78 year old female sent to me by my partner Dr. Burnetta to discuss left hip replacement surgery.  She does have known severe anterior arthritis of her left hip.  This was confirmed on a MRI of her left hip.  She has also tried and failed conservative treatment including steroid injection of the left hip joint.  At this point her left hip pain is daily and it is 10 out of 10 and quite severe.  It is detriment affecting her mobility, quality life and actives daily living.  She is on Eliquis  for history of PEs and can come off Eliquis  for surgery.  She is not a diabetic.  Her husband is with her today as well.  I was able to review all of her past medical history and medications within epic.  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  It is at the distal humerus.  She does report severe left hip pain and an injection barely helped.  I did go over her MRI findings of her left hip.  She has severe and stage arthritis of her left hip and there is subchondral edema in the femoral head and neck and the acetabulum with a joint effusion and loose body.  On exam her right hip moves smoothly and fully but the left hip has severe pain with attempts of internal and external rotation with significant pain in the groin as well.  We had a long and thorough discussion about hip replacement surgery.  We discussed the risks and benefits of the surgery and what to expect from an intraoperative and postoperative standpoint.  I went over hip replacement model and gave her handout about hip replacement surgery.  She knows that she would need to be off Eliquis  for 3 full days which she can come off of and then we will start her back on Eliquis .  She is in show and having surgery soon so we will work on scheduling this at [employer].  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  All questions and concerns were addressed and answered.

## 2024-03-02 ENCOUNTER — Telehealth: Payer: Self-pay

## 2024-03-02 NOTE — Telephone Encounter (Signed)
 I called patient to discuss scheduling THA surgery.  Left voice mail message for return call.

## 2024-03-08 ENCOUNTER — Other Ambulatory Visit: Payer: Self-pay | Admitting: Physician Assistant

## 2024-03-08 DIAGNOSIS — Z01818 Encounter for other preprocedural examination: Secondary | ICD-10-CM

## 2024-03-10 NOTE — Progress Notes (Signed)
 Surgical Instructions   Your procedure is scheduled on October 23. Report to [facility] Main Entrance A at 5:30 A.M., then check in with the Admitting office. Any questions or running late day of surgery: call [phone]  Questions prior to your surgery date: call [phone], Monday-Friday, 8am-4pm. If you experience any cold or flu symptoms such as cough, fever, chills, shortness of breath, etc. between now and your scheduled surgery, please notify us  at the above number.     Remember:  Do not eat after midnight the night before your surgery  You may drink clear liquids until 4:30am the morning of your surgery.   Clear liquids allowed are: Water, Non-Citrus Juices (without pulp), Carbonated Beverages, Clear Tea (no milk, honey, etc.), Black Coffee Only (NO MILK, CREAM OR POWDERED CREAMER of any kind), and Gatorade.  Patient Instructions  The night before surgery:  No food after midnight. ONLY clear liquids after midnight  The day of surgery (if you do NOT have diabetes):  Drink ONE (1) Pre-Surgery Clear Ensure by 4:30am the morning of surgery. Drink in one sitting. Do not sip.  This drink was given to you during your hospital  pre-op appointment visit.  Nothing else to drink after completing the  Pre-Surgery Clear Ensure.          If you have questions, please contact your surgeon's office.   Take these medicines the morning of surgery with A SIP OF WATER  diltiazem  (CARDIZEM  CD)  hydrALAZINE  (APRESOLINE )  levothyroxine  (SYNTHROID )  pregabalin  (LYRICA )   May take these medicines IF NEEDED: acetaminophen (TYLENOL)   PER DR. BLACKMAN'S INSTRUCTIONS,HOLD ELIQUIS  3 DAYS PRIOR TO SURGERY. YOU WILL TAKE YOUR LAST DOSE ON SUNDAY OCTOBER 19.    One week prior to surgery, STOP taking any Aspirin (unless otherwise instructed by your surgeon) Aleve, Naproxen, Ibuprofen, Motrin, Advil, Goody's, BC's, all herbal medications, fish oil, and non-prescription vitamins.                      Do NOT Smoke (Tobacco/Vaping) for 24 hours prior to your procedure.  If you use a CPAP at night, you may bring your mask/headgear for your overnight stay.   You will be asked to remove any contacts, glasses, piercing's, hearing aid's, dentures/partials prior to surgery. Please bring cases for these items if needed.    Patients discharged the day of surgery will not be allowed to drive home, and someone needs to stay with them for 24 hours.  SURGICAL WAITING ROOM VISITATION Patients may have no more than 2 support people in the waiting area - these visitors may rotate.   Pre-op nurse will coordinate an appropriate time for 1 ADULT support person, who may not rotate, to accompany patient in pre-op.  Children under the age of 97 must have an adult with them who is not the patient and must remain in the main waiting area with an adult.  If the patient needs to stay at the hospital during part of their recovery, the visitor guidelines for inpatient rooms apply.  Please refer to the [facility] website for the visitor guidelines for any additional information.   If you received a COVID test during your pre-op visit  it is requested that you wear a mask when out in public, stay away from anyone that may not be feeling well and notify your surgeon if you develop symptoms. If you have been in contact with anyone that has tested positive in the last 10 days  please notify you surgeon.      Pre-operative 4 CHG Bathing Instructions   You can play a key role in reducing the risk of infection after surgery. Your skin needs to be as free of germs as possible. You can reduce the number of germs on your skin by washing with CHG (chlorhexidine  gluconate) soap before surgery. CHG is an antiseptic soap that kills germs and continues to kill germs even after washing.   DO NOT use if you have an allergy to chlorhexidine /CHG or antibacterial soaps. If your skin becomes reddened or irritated, stop using  the CHG and notify one of our RNs at [phone].   Please shower with the CHG soap starting 4 days before surgery using the following schedule:     Please keep in mind the following:  DO NOT shave, including legs and underarms, starting the day of your first shower.   You may shave your face at any point before/day of surgery.  Place clean sheets on your bed the day you start using CHG soap. Use a clean washcloth (not used since being washed) for each shower. DO NOT sleep with pets once you start using the CHG.   CHG Shower Instructions:  Wash your face and private area with normal soap. If you choose to wash your hair, wash first with your normal shampoo.  After you use shampoo/soap, rinse your hair and body thoroughly to remove shampoo/soap residue.  Turn the water OFF and apply  bottle of CHG soap to a CLEAN washcloth.  Apply CHG soap ONLY FROM YOUR NECK DOWN TO YOUR TOES (washing for 3-5 minutes)  DO NOT use CHG soap on face, private areas, open wounds, or sores.  Pay special attention to the area where your surgery is being performed.  If you are having back surgery, having someone wash your back for you may be helpful. Wait 2 minutes after CHG soap is applied, then you may rinse off the CHG soap.  Pat dry with a clean towel  Put on clean clothes/pajamas   If you choose to wear lotion, please use ONLY the CHG-compatible lotions that are listed below.  Additional instructions for the day of surgery:  If you choose, you may shower the morning of surgery with an antibacterial soap.  DO NOT APPLY any lotions, deodorants, cologne, or perfumes.   Do not bring valuables to the hospital. [facility] is not responsible for any belongings/valuables. Do not wear nail polish, gel polish, artificial nails, or any other type of covering on natural nails (fingers and toes) Do not wear jewelry or makeup Put on clean/comfortable clothes.  Please brush your teeth.  Ask your nurse before  applying any prescription medications to the skin.     CHG Compatible Lotions   Aveeno Moisturizing lotion  Cetaphil Moisturizing Cream  Cetaphil Moisturizing Lotion  Clairol Herbal Essence Moisturizing Lotion, Dry Skin  Clairol Herbal Essence Moisturizing Lotion, Extra Dry Skin  Clairol Herbal Essence Moisturizing Lotion, Normal Skin  Curel Age Defying Therapeutic Moisturizing Lotion with Alpha Hydroxy  Curel Extreme Care Body Lotion  Curel Soothing Hands Moisturizing Hand Lotion  Curel Therapeutic Moisturizing Cream, Fragrance-Free  Curel Therapeutic Moisturizing Lotion, Fragrance-Free  Curel Therapeutic Moisturizing Lotion, Original Formula  Eucerin Daily Replenishing Lotion  Eucerin Dry Skin Therapy Plus Alpha Hydroxy Crme  Eucerin Dry Skin Therapy Plus Alpha Hydroxy Lotion  Eucerin Original Crme  Eucerin Original Lotion  Eucerin Plus Crme Eucerin Plus Lotion  Eucerin TriLipid Replenishing Lotion  Keri Anti-Bacterial  Hand Lotion  Keri Deep Conditioning Original Lotion Dry Skin Formula Softly Scented  Keri Deep Conditioning Original Lotion, Fragrance Free Sensitive Skin Formula  Keri Lotion Fast Absorbing Fragrance Free Sensitive Skin Formula  Keri Lotion Fast Absorbing Softly Scented Dry Skin Formula  Keri Original Lotion  Keri Skin Renewal Lotion Keri Silky Smooth Lotion  Keri Silky Smooth Sensitive Skin Lotion  Nivea Body Creamy Conditioning Oil  Nivea Body Extra Enriched Lotion  Nivea Body Original Lotion  Nivea Body Sheer Moisturizing Lotion Nivea Crme  Nivea Skin Firming Lotion  NutraDerm 30 Skin Lotion  NutraDerm Skin Lotion  NutraDerm Therapeutic Skin Cream  NutraDerm Therapeutic Skin Lotion  ProShield Protective Hand Cream  Provon moisturizing lotion  Please read over the following fact sheets that you were given.

## 2024-03-13 ENCOUNTER — Inpatient Hospital Stay (HOSPITAL_COMMUNITY)
Admission: RE | Admit: 2024-03-13 | Discharge: 2024-03-13 | Disposition: A | Source: Ambulatory Visit | Attending: Orthopaedic Surgery

## 2024-03-13 ENCOUNTER — Encounter (HOSPITAL_COMMUNITY): Payer: Self-pay

## 2024-03-13 ENCOUNTER — Other Ambulatory Visit: Payer: Self-pay

## 2024-03-13 VITALS — BP 185/72 | HR 57 | Temp 98.1°F | Ht 64.0 in | Wt 159.1 lb

## 2024-03-13 DIAGNOSIS — E039 Hypothyroidism, unspecified: Secondary | ICD-10-CM | POA: Diagnosis not present

## 2024-03-13 DIAGNOSIS — Z7901 Long term (current) use of anticoagulants: Secondary | ICD-10-CM | POA: Diagnosis not present

## 2024-03-13 DIAGNOSIS — Z86718 Personal history of other venous thrombosis and embolism: Secondary | ICD-10-CM | POA: Insufficient documentation

## 2024-03-13 DIAGNOSIS — Z8542 Personal history of malignant neoplasm of other parts of uterus: Secondary | ICD-10-CM | POA: Insufficient documentation

## 2024-03-13 DIAGNOSIS — Z299 Encounter for prophylactic measures, unspecified: Secondary | ICD-10-CM | POA: Diagnosis not present

## 2024-03-13 DIAGNOSIS — Z01818 Encounter for other preprocedural examination: Secondary | ICD-10-CM | POA: Insufficient documentation

## 2024-03-13 DIAGNOSIS — Z01812 Encounter for preprocedural laboratory examination: Secondary | ICD-10-CM | POA: Diagnosis present

## 2024-03-13 DIAGNOSIS — Z86711 Personal history of pulmonary embolism: Secondary | ICD-10-CM | POA: Insufficient documentation

## 2024-03-13 DIAGNOSIS — Z01810 Encounter for preprocedural cardiovascular examination: Secondary | ICD-10-CM | POA: Diagnosis present

## 2024-03-13 DIAGNOSIS — I10 Essential (primary) hypertension: Secondary | ICD-10-CM | POA: Insufficient documentation

## 2024-03-13 DIAGNOSIS — M7989 Other specified soft tissue disorders: Secondary | ICD-10-CM | POA: Diagnosis not present

## 2024-03-13 DIAGNOSIS — Z95810 Presence of automatic (implantable) cardiac defibrillator: Secondary | ICD-10-CM | POA: Insufficient documentation

## 2024-03-13 DIAGNOSIS — Z89202 Acquired absence of left upper limb, unspecified level: Secondary | ICD-10-CM | POA: Insufficient documentation

## 2024-03-13 LAB — TYPE AND SCREEN
ABO/RH(D): A POS
Antibody Screen: NEGATIVE

## 2024-03-13 LAB — CBC
HCT: 37.4 % (ref 36.0–46.0)
Hemoglobin: 12.2 g/dL (ref 12.0–15.0)
MCH: 32.4 pg (ref 26.0–34.0)
MCHC: 32.6 g/dL (ref 30.0–36.0)
MCV: 99.2 fL (ref 80.0–100.0)
Platelets: 214 K/uL (ref 150–400)
RBC: 3.77 MIL/uL — ABNORMAL LOW (ref 3.87–5.11)
RDW: 12.7 % (ref 11.5–15.5)
WBC: 5.4 K/uL (ref 4.0–10.5)
nRBC: 0 % (ref 0.0–0.2)

## 2024-03-13 LAB — BASIC METABOLIC PANEL WITH GFR
Anion gap: 12 (ref 5–15)
BUN: 19 mg/dL (ref 8–23)
CO2: 21 mmol/L — ABNORMAL LOW (ref 22–32)
Calcium: 9.5 mg/dL (ref 8.9–10.3)
Chloride: 106 mmol/L (ref 98–111)
Creatinine, Ser: 0.88 mg/dL (ref 0.44–1.00)
GFR, Estimated: >60 mL/min (ref >60)
Glucose, Bld: 100 mg/dL — ABNORMAL HIGH (ref 70–99)
Potassium: 4.4 mmol/L (ref 3.5–5.1)
Sodium: 139 mmol/L (ref 135–145)

## 2024-03-13 LAB — SURGICAL PCR SCREEN
MRSA, PCR: NEGATIVE
Staphylococcus aureus: NEGATIVE

## 2024-03-13 NOTE — Progress Notes (Addendum)
 PCP - Dhruv Vyas,MD Cardiologist - denies Pulmonologist - Rakesh Alva,MD  PPM/ICD - denies Device Orders -  Rep Notified -   Chest x-ray - na EKG - 03/13/24 Stress Test - denies ECHO - 10/17/21 Cardiac Cath - denies  Sleep Study - denies CPAP - no  Fasting Blood Sugar - na Checks Blood Sugar _____ times a day  Last dose of GLP1 agonist-  na GLP1 instructions:   Blood Thinner Instructions:PER DR. BLACKMAN'S INSTRUCTIONS,HOLD ELIQUIS  3 DAYS PRIOR TO SURGERY. YOU WILL TAKE YOUR LAST DOSE ON SUNDAY OCTOBER 19.  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   Patient denies shortness of breath, fever, cough and chest pain at PAT appointment   All instructions explained to the patient, with a verbal understanding of the material. Patient agrees to go over the instructions while at home for a better understanding.  The opportunity to ask questions was provided.

## 2024-03-14 ENCOUNTER — Ambulatory Visit: Admitting: Neurology

## 2024-03-14 NOTE — Progress Notes (Signed)
 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  She was admitted 5/6-5/11/23 with syncope and fall and had a small subdural hematoma along the falx.  CT chest showed moderate right and small left pleural effusion with airspace consolidation and air bronchograms in the right middle lobe suspicious for pneumonia.  She underwent right thoracentesis with removal of 1.2 L of fluid, noted to be transudative.  She was treated with Rocephin /azithromycin  and discharged on oral antibiotics and 2 L of oxygen .  she was readmitted 09/2021 for hypoxia, leg swelling and bilateral effusions requiring bilateral thoracenteses, PCCM consulted. She was found to have bilateral lower extremity DVT/acute pulmonary embolism. SVC syndrome was the most likely cause of her pleural effusions and shortness of breath, she underwent placement of SVC stent with angioplasty of right axillary and subclavian veins and IJ by vascular. 11/2021 RUE ultrasound >> right subclavian stent was occluded and RIJ  had thrombus.  Last seen in vascular surgery follow-up by Maurilio Collet, PA-C on 12/10/2021.  Per note, The stent is not fully visible it is reported as possible occluded.  She is completely asymptomatic.  She should continue Eliquis  due to her history of PE's and UE DVT for life.  This can be managed by her PCP.  If she develops recurrent symptoms she will need a CTA of the chest.  At this point she will f/u PRN.  Patient instructed by Dr. Vernetta to hold Eliquis  3 days prior to surgery.  Reports last dose 03/12/2024.  Preop labs reviewed, unremarkable.  EKG 03/13/2024: Sinus bradycardia.  Rate 53.  TTE 10/17/2021:   1. Left ventricular ejection fraction, by estimation, is 55 to 60%. Left  ventricular ejection fraction by 2D MOD biplane is 57.4 %. The left  ventricle has normal function. The left ventricle has no regional wall  motion abnormalities. Left ventricular  diastolic parameters are consistent with Grade I diastolic dysfunction  (impaired relaxation).   2. Right ventricular systolic function is normal. The right ventricular  size is normal. There is normal pulmonary artery systolic pressure. The  estimated right ventricular systolic pressure is 18.8 mmHg.   3. The pericardial effusion is circumferential. There is no evidence of  cardiac tamponade.   4. The mitral valve is grossly normal. Trivial mitral valve  regurgitation.   5. The aortic valve is tricuspid. Aortic valve regurgitation is not  visualized.   6. The inferior vena cava is normal in size with greater than 50%  respiratory variability, suggesting right atrial pressure of 3 mmHg.   Comparison(s): Changes from prior study are noted. 10/15/2021: LVEF 70-75%,  normal RV function, small pericardial effusion anterior to the RV.    Lynwood Geofm RIGGERS [facility] Stay [facility]/Anesthesiology Phone [phone] 03/14/2024 4:17 PM

## 2024-03-14 NOTE — Anesthesia Preprocedure Evaluation (Signed)
 Anesthesia Evaluation  Patient identified by MRN, date of birth, ID band Patient awake    Reviewed: Allergy & Precautions, NPO status , Patient's Chart, lab work & pertinent test results  Airway Mallampati: III  TM Distance: >3 FB Neck ROM: Full    Dental no notable dental hx. 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  - s/p R thoracentesis 1.2 L, transudate, with negative cytology during prior admission. Repeated 5/24, 1.4L clear yellow with protein level suggestive technically of exudate. Cytology without malignancy identified. - s/p L thoracentesis this admission 5/25 800cc fluid discarded, no labs.     Pulmonary exam normal breath sounds clear to auscultation       Cardiovascular hypertension, Pt. on medications + Peripheral Vascular Disease (SVC syndrome) and +CHF  Normal cardiovascular exam Rhythm:Regular Rate:Normal  Echo 09/2021  1. Left ventricular ejection fraction, by estimation, is 55 to 60%. Left  ventricular ejection fraction by 2D MOD biplane is 57.4 %. The left  ventricle has normal function. The left ventricle has no regional wall  motion abnormalities. Left ventricular diastolic parameters are consistent  with Grade I diastolic dysfunction (impaired relaxation).   2. Right ventricular systolic function is normal. The right ventricular  size is normal. There is normal pulmonary artery systolic pressure. The  estimated right ventricular systolic pressure is 18.8 mmHg.   3. The pericardial effusion is circumferential. There is no evidence of  cardiac tamponade.   4. The mitral valve is grossly normal. Trivial mitral valve regurgitation.   5. The aortic valve is tricuspid. Aortic valve regurgitation is not visualized.   6. The inferior vena cava is normal in size with greater than 50%  respiratory variability, suggesting right  atrial pressure of 3 mmHg.     Neuro/Psych  Headaches    GI/Hepatic negative GI ROS, Neg liver ROS,,,  Endo/Other  Hypothyroidism    Renal/GU Renal InsufficiencyRenal disease (AKI)     Musculoskeletal  (+) Arthritis ,  status post left arm amputation   Abdominal   Peds  Hematology  (+) Blood dyscrasia (HIT), anemia   Anesthesia Other Findings   Reproductive/Obstetrics history of endometrial cancer status post hysterectomy                              Anesthesia Physical Anesthesia Plan  ASA: 3  Anesthesia Plan: Spinal   Post-op Pain Management: Ofirmev IV (intra-op)*   Induction: Intravenous  PONV Risk Score and Plan: 3 and Dexamethasone , Ondansetron  and Treatment may vary due to age or medical condition  Airway Management Planned: Natural Airway  Additional Equipment:   Intra-op Plan:   Post-operative Plan:   Informed Consent: I have reviewed the patients History and Physical, chart, labs and discussed the procedure including the risks, benefits and alternatives for the proposed anesthesia with the patient or authorized representative who has indicated his/her understanding and acceptance.     Dental advisory given  Plan Discussed with: CRNA  Anesthesia Plan Comments: (Pt reports last dose of eliquis  PM 03/12/2024 Risks of anesthesia explained at length. This includes, but is not limited to, sore throat, damage to teeth, lips gums, tongue and vocal cords, nausea and vomiting, reactions to medications, stroke, heart attack, and death. All patient questions were answered and the patient wishes to proceed. Risks of spinal anesthesia explained at length. This includes, but is not limited to, bleeding,  infection, reactions to the medications, seizures, headache, damage to surrounding structures, damage to nerves, permanent weakness, numbness, tingling and pain. All patient questions were answered and patient wishes to proceed with spinal  anesthesia.   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  She was admitted 5/6-5/11/23 with syncope and fall and had a small subdural hematoma along the falx.  CT chest showed moderate right and small left pleural effusion with airspace consolidation and air bronchograms in the right middle lobe suspicious for pneumonia.  She underwent right thoracentesis with removal of 1.2 L of fluid, noted to be transudative.  She was treated with Rocephin /azithromycin  and discharged on oral antibiotics and 2 L of oxygen .  she was readmitted 09/2021 for hypoxia, leg swelling and bilateral effusions requiring bilateral thoracenteses, PCCM consulted. She was found to have bilateral lower extremity DVT/acute pulmonary embolism. SVC syndrome was the most likely cause of her pleural effusions and shortness of breath, she underwent placement of SVC stent with angioplasty of right axillary and subclavian veins and IJ by vascular. 11/2021 RUE ultrasound >> right subclavian stent was occluded and RIJ  had thrombus.  Last seen in vascular surgery follow-up by Maurilio Collet, PA-C on 12/10/2021.  Per note, The stent is not fully visible it is reported as possible occluded.  She is completely asymptomatic.  She should continue Eliquis  due to her history of PE's and UE DVT for life.  This can be managed by her PCP.  If she develops recurrent symptoms she will need a CTA of the chest.  At this point she will f/u PRN.  Patient instructed by Dr. Vernetta to hold Eliquis  3 days prior to surgery.  Reports last dose 03/12/2024.  Preop labs reviewed, unremarkable.  EKG 03/13/2024: Sinus bradycardia.  Rate  53.  TTE 10/17/2021: 1. Left ventricular ejection fraction, by estimation, is 55 to 60%. Left  ventricular ejection fraction by 2D MOD biplane is 57.4 %. The left  ventricle has normal function. The left ventricle has no regional wall  motion abnormalities. Left ventricular  diastolic parameters are consistent with Grade I diastolic dysfunction  (impaired relaxation).  2. Right ventricular systolic function is normal. The right ventricular  size is normal. There is normal pulmonary artery systolic pressure. The  estimated right ventricular systolic pressure is 18.8 mmHg.  3. The pericardial effusion is circumferential. There is no evidence of  cardiac tamponade.  4. The mitral valve is grossly normal. Trivial mitral valve  regurgitation.  5. The aortic valve is tricuspid. Aortic valve regurgitation is not  visualized.  6. The inferior vena cava is normal in size with greater than 50%  respiratory variability, suggesting right atrial pressure of 3 mmHg.   Comparison(s): Changes from prior study are noted. 10/15/2021: LVEF 70-75%,  normal RV function, small pericardial effusion anterior to the RV.  )         Anesthesia Quick Evaluation

## 2024-03-15 NOTE — H&P (Signed)
 TOTAL HIP ADMISSION H&P  Patient is admitted for left total hip arthroplasty.  Subjective:  Chief Complaint: left hip pain  HPI: [name], 78 y.o. female, has a history of pain and functional disability in the left hip(s) due to arthritis and patient has failed non-surgical conservative treatments for greater than 12 weeks to include corticosteriod injections, use of assistive devices, and activity modification.  Onset of symptoms was gradual starting just this year with rapidlly worsening course since that time.The patient noted no past surgery on the left hip(s).  Patient currently rates pain in the left hip at 10 out of 10 with activity. Patient has night pain, worsening of pain with activity and weight bearing, pain that interfers with activities of daily living, and pain with passive range of motion. Patient has evidence of subchondral sclerosis, periarticular osteophytes, joint space narrowing, and subchondral edema by imaging studies. This condition presents safety issues increasing the risk of falls.  There is no current active infection.  Patient Active Problem List   Diagnosis Date Noted   Unilateral primary osteoarthritis, left hip 09/06/2023   Bilateral pulmonary embolism (HCC) 10/17/2021   Bilateral pleural effusion 10/14/2021   Prolonged QT interval 10/14/2021   Acquired hypothyroidism 10/14/2021   Essential hypertension 10/14/2021   Syncopal episodes 09/27/2021   Subdural hematoma (HCC) 09/27/2021   Acute cystitis without hematuria 07/12/2021   Closed fracture of right distal radius and ulna 02/23/2019   Chronic insomnia 12/15/2018   Restless leg syndrome 12/15/2018   Paresthesia 12/15/2018   Past Medical History:  Diagnosis Date   Dysphasia    Endometrial cancer (HCC)    Heel spur    Heparin induced thrombocytopenia    Hypercholesteremia    Hypertension    Hypothyroidism    Migraine headache    Osteopenia    RLS (restless legs syndrome)    SVC syndrome     Thyroid  disease     Past Surgical History:  Procedure Laterality Date   ABDOMINAL AORTIC ENDOVASCULAR STENT GRAFT Right 10/19/2021   Procedure: CENTRAL VENOGRAM;  Surgeon: Sheree Penne Bruckner, MD;  Location: [facility] OR;  Service: Vascular;  Laterality: Right;   ABDOMINAL HYSTERECTOMY     ANGIOPLASTY Right 10/19/2021   Procedure: BALLOON ANGIOPLASTY INTERNAL JUGULAR, INNOMINATE AND SUBCLAVIAN;  Surgeon: Sheree Penne Bruckner, MD;  Location: [facility] OR;  Service: Vascular;  Laterality: Right;   ANKLE SURGERY Right    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   Current Outpatient Medications  Medication Sig Dispense Refill Last Dose/Taking   acetaminophen (TYLENOL) 650 MG CR tablet Take 1,300 mg by mouth every 8 (eight) hours.   Taking   Ascorbic Acid (VITAMIN C) 1000 MG tablet Take 1,000 mg by mouth 2 (two) times a day.   Taking   b complex vitamins tablet Take 2 tablets by mouth daily.   Taking   cholecalciferol (VITAMIN D3) 25 MCG (1000 UNIT) tablet Take 1,000 Units by mouth daily.   Taking   diltiazem  (CARDIZEM  CD) 180 MG 24 hr capsule  Take 180 mg by mouth daily.   Taking   ELIQUIS  5 MG TABS tablet TAKE 1 TABLET TWICE DAILY 60 tablet 0 Taking   Ferrous Sulfate (IRON SUPPLEMENT PO) Take 1 tablet by mouth daily.   Taking   hydrALAZINE  (APRESOLINE ) 25 MG tablet Take 25 mg by mouth 3 (three) times daily.   Taking   ibuprofen (ADVIL) 200 MG tablet Take 400 mg by mouth at bedtime.   Taking   levothyroxine  (SYNTHROID ) 75 MCG tablet Take 75 mcg by mouth daily before breakfast.   Taking    MAGNESIUM  MALATE PO Take 2 tablets by mouth daily.   Taking   potassium chloride  (KLOR-CON  M) 10 MEQ tablet Take 10 mEq by mouth daily.   Taking   pregabalin  (LYRICA ) 75 MG capsule Take 1 capsule (75 mg total) by mouth 3 (three) times daily. 270 capsule 1 Taking   rosuvastatin (CRESTOR) 5 MG tablet Take 5 mg by mouth once a week.   Taking   rotigotine  (NEUPRO ) 2 MG/24HR Place 1 patch onto the skin daily. 30 patch 12 Taking   TART CHERRY PO Take 1 tablet by mouth daily.   Taking   hydrALAZINE  (APRESOLINE ) 50 MG tablet Take 1 tablet (50 mg total) by mouth 3 (three) times daily. (Patient not taking: Reported on 03/09/2024) 90 tablet 1 Not Taking   Allergies  Allergen Reactions   Iodinated Contrast Media Hives and Rash   Atorvastatin Calcium Other (See Comments)    Musculoskeletal aches Other reaction(s): Other (See Comments) Musculoskeletal aches   Doxycycline Other (See Comments)    Liver problems    Heparin Other (See Comments)    Causes blood clots. Other reaction(s): Other (See Comments) Causes blood clots.   Lisinopril Cough   Lovastatin Diarrhea   Ropinirole Hcl Nausea Only    Social History   Tobacco Use   Smoking status: Never    Passive exposure: Never   Smokeless tobacco: Never  Substance Use Topics   Alcohol  use: No    Family History  Problem Relation Age of Onset   Stroke Mother    Transient ischemic attack Father    Dementia Father    Pneumonia Father    Diabetes Sister    Rheum arthritis Sister    Brain cancer Brother    Breast cancer Maternal Grandmother    Stroke Paternal Grandmother      Review of Systems  Objective:  Physical Exam Vitals reviewed.  Constitutional:      Appearance: Normal appearance. She is normal weight.  HENT:     Head: Normocephalic and atraumatic.  Eyes:     Extraocular Movements: Extraocular movements intact.     Pupils: Pupils are equal, round, and reactive to light.  Cardiovascular:     Rate and Rhythm: Normal rate.   Pulmonary:     Effort: Pulmonary effort is normal.     Breath sounds: Normal breath sounds.  Abdominal:     Palpations: Abdomen is soft.  Musculoskeletal:     Cervical back: Normal range of motion and neck supple.     Left hip: Tenderness and bony tenderness present. Decreased range of motion. Decreased strength.  Neurological:     Mental Status: She is alert and oriented to person, place, and time.  Psychiatric:        Behavior: Behavior normal.     Vital signs in last 24 hours:    Labs:   Estimated body mass index is 27.31 kg/m as calculated from the following:  Height as of 03/13/24: 5' 4 (1.626 m).   Weight as of 03/13/24: 72.2 kg.   Imaging Review Plain radiographs and a MRI demonstrates severe degenerative joint disease of the left hip(s). The bone quality appears to be good for age and reported activity level.      Assessment/Plan:  End stage arthritis, left hip(s)  The patient history, physical examination, clinical judgement of the provider and imaging studies are consistent with end stage degenerative joint disease of the left hip(s) and total hip arthroplasty is deemed medically necessary. The treatment options including medical management, injection therapy, arthroscopy and arthroplasty were discussed at length. The risks and benefits of total hip arthroplasty were presented and reviewed. The risks due to aseptic loosening, infection, stiffness, dislocation/subluxation,  thromboembolic complications and other imponderables were discussed.  The patient acknowledged the explanation, agreed to proceed with the plan and consent was signed. Patient is being admitted for inpatient treatment for surgery, pain control, PT, OT, prophylactic antibiotics, VTE prophylaxis, progressive ambulation and ADL's and discharge planning.

## 2024-03-16 ENCOUNTER — Encounter (HOSPITAL_COMMUNITY)
Admission: RE | Disposition: A | Discharge status: Home / Self Care | Payer: Self-pay | Source: Home / Self Care | Attending: Orthopaedic Surgery

## 2024-03-16 ENCOUNTER — Ambulatory Visit (HOSPITAL_COMMUNITY): Payer: Self-pay | Admitting: Anesthesiology

## 2024-03-16 ENCOUNTER — Encounter (HOSPITAL_COMMUNITY): Payer: Self-pay | Admitting: Orthopaedic Surgery

## 2024-03-16 ENCOUNTER — Observation Stay (HOSPITAL_COMMUNITY)
Admission: RE | Admit: 2024-03-16 | Discharge: 2024-03-20 | Disposition: A | Discharge status: Home / Self Care | Attending: Orthopaedic Surgery | Admitting: Orthopaedic Surgery

## 2024-03-16 ENCOUNTER — Ambulatory Visit (HOSPITAL_COMMUNITY): Payer: Self-pay | Admitting: Physician Assistant

## 2024-03-16 ENCOUNTER — Ambulatory Visit (HOSPITAL_COMMUNITY)

## 2024-03-16 ENCOUNTER — Other Ambulatory Visit: Payer: Self-pay

## 2024-03-16 DIAGNOSIS — Z7901 Long term (current) use of anticoagulants: Secondary | ICD-10-CM | POA: Diagnosis not present

## 2024-03-16 DIAGNOSIS — E039 Hypothyroidism, unspecified: Secondary | ICD-10-CM | POA: Insufficient documentation

## 2024-03-16 DIAGNOSIS — Z79899 Other long term (current) drug therapy: Secondary | ICD-10-CM | POA: Diagnosis not present

## 2024-03-16 DIAGNOSIS — I509 Heart failure, unspecified: Secondary | ICD-10-CM | POA: Diagnosis not present

## 2024-03-16 DIAGNOSIS — Z8542 Personal history of malignant neoplasm of other parts of uterus: Secondary | ICD-10-CM | POA: Insufficient documentation

## 2024-03-16 DIAGNOSIS — M1612 Unilateral primary osteoarthritis, left hip: Secondary | ICD-10-CM | POA: Diagnosis not present

## 2024-03-16 DIAGNOSIS — I110 Hypertensive heart disease with heart failure: Secondary | ICD-10-CM

## 2024-03-16 DIAGNOSIS — Z96642 Presence of left artificial hip joint: Secondary | ICD-10-CM | POA: Diagnosis not present

## 2024-03-16 DIAGNOSIS — Z471 Aftercare following joint replacement surgery: Secondary | ICD-10-CM | POA: Diagnosis not present

## 2024-03-16 DIAGNOSIS — M25552 Pain in left hip: Secondary | ICD-10-CM | POA: Diagnosis present

## 2024-03-16 HISTORY — PX: TOTAL HIP ARTHROPLASTY: SHX124

## 2024-03-16 SURGERY — ARTHROPLASTY, HIP, TOTAL, ANTERIOR APPROACH
Anesthesia: Spinal | Site: Hip | Laterality: Left

## 2024-03-16 MED ORDER — POVIDONE-IODINE 10 % EX SWAB
2.0000 | Freq: Once | CUTANEOUS | Status: DC
Start: 2024-03-16 — End: 2024-03-16

## 2024-03-16 MED ORDER — BUPIVACAINE IN DEXTROSE 0.75-8.25 % IT SOLN
INTRATHECAL | Status: DC | PRN
  Administered 2024-03-16: 1.8 mL via INTRATHECAL

## 2024-03-16 MED ORDER — INFLUENZA VAC SPLIT HIGH-DOSE 0.5 ML IM SUSY
0.5000 mL | PREFILLED_SYRINGE | INTRAMUSCULAR | Status: DC
  Filled 2024-03-16: qty 0.5

## 2024-03-16 MED ORDER — PANTOPRAZOLE SODIUM 40 MG PO TBEC
40.0000 mg | DELAYED_RELEASE_TABLET | Freq: Every day | ORAL | Status: DC
  Administered 2024-03-16 – 2024-03-20 (×5): 40 mg via ORAL
  Filled 2024-03-16 (×5): qty 1

## 2024-03-16 MED ORDER — DOCUSATE SODIUM 100 MG PO CAPS
100.0000 mg | ORAL_CAPSULE | Freq: Two times a day (BID) | ORAL | Status: DC
Start: 2024-03-16 — End: 2024-03-20
  Administered 2024-03-16 – 2024-03-20 (×8): 100 mg via ORAL
  Filled 2024-03-16 (×9): qty 1

## 2024-03-16 MED ORDER — SODIUM CHLORIDE 0.9 % IR SOLN
Status: DC | PRN
  Administered 2024-03-16: 1000 mL

## 2024-03-16 MED ORDER — LIDOCAINE 2% (20 MG/ML) 5 ML SYRINGE
INTRAMUSCULAR | Status: AC
  Filled 2024-03-16: qty 5

## 2024-03-16 MED ORDER — ROTIGOTINE 2 MG/24HR TD PT24
1.0000 | MEDICATED_PATCH | TRANSDERMAL | Status: DC
  Administered 2024-03-17 – 2024-03-19 (×3): 1 via TRANSDERMAL
  Filled 2024-03-16 (×5): qty 1

## 2024-03-16 MED ORDER — ONDANSETRON HCL 4 MG PO TABS: 4.0000 mg | ORAL_TABLET | Freq: Four times a day (QID) | ORAL | Status: DC | PRN

## 2024-03-16 MED ORDER — HYDROCODONE-ACETAMINOPHEN 5-325 MG PO TABS
1.0000 | ORAL_TABLET | ORAL | Status: DC | PRN
  Administered 2024-03-16: 2 via ORAL
  Administered 2024-03-17 – 2024-03-20 (×3): 1 via ORAL
  Filled 2024-03-16: qty 2
  Filled 2024-03-16 (×3): qty 1

## 2024-03-16 MED ORDER — POTASSIUM CHLORIDE CRYS ER 10 MEQ PO TBCR
10.0000 meq | EXTENDED_RELEASE_TABLET | Freq: Every day | ORAL | Status: DC
  Administered 2024-03-17 – 2024-03-20 (×4): 10 meq via ORAL
  Filled 2024-03-16 (×4): qty 1

## 2024-03-16 MED ORDER — CEFAZOLIN SODIUM-DEXTROSE 2-4 GM/100ML-% IV SOLN
2.0000 g | INTRAVENOUS | Status: AC
  Administered 2024-03-16: 2 g via INTRAVENOUS
  Filled 2024-03-16: qty 100

## 2024-03-16 MED ORDER — PHENOL 1.4 % MT LIQD: 1.0000 | OROMUCOSAL | Status: DC | PRN

## 2024-03-16 MED ORDER — DILTIAZEM HCL ER COATED BEADS 180 MG PO CP24
180.0000 mg | ORAL_CAPSULE | Freq: Every day | ORAL | Status: DC
  Administered 2024-03-17 – 2024-03-20 (×4): 180 mg via ORAL
  Filled 2024-03-16 (×4): qty 1

## 2024-03-16 MED ORDER — PHENYLEPHRINE 80 MCG/ML (10ML) SYRINGE FOR IV PUSH (FOR BLOOD PRESSURE SUPPORT)
PREFILLED_SYRINGE | INTRAVENOUS | Status: DC | PRN
  Administered 2024-03-16 (×3): 80 ug via INTRAVENOUS

## 2024-03-16 MED ORDER — 0.9 % SODIUM CHLORIDE (POUR BTL) OPTIME
TOPICAL | Status: DC | PRN
  Administered 2024-03-16: 1000 mL

## 2024-03-16 MED ORDER — ONDANSETRON HCL 4 MG/2ML IJ SOLN: 4.0000 mg | Freq: Four times a day (QID) | INTRAMUSCULAR | Status: DC | PRN

## 2024-03-16 MED ORDER — MORPHINE SULFATE (PF) 2 MG/ML IV SOLN: 0.5000 mg | INTRAVENOUS | Status: DC | PRN

## 2024-03-16 MED ORDER — IRBESARTAN 150 MG PO TABS
150.0000 mg | ORAL_TABLET | Freq: Every day | ORAL | Status: DC
Start: 2024-03-17 — End: 2024-03-20
  Administered 2024-03-17 – 2024-03-20 (×4): 150 mg via ORAL
  Filled 2024-03-16 (×4): qty 1

## 2024-03-16 MED ORDER — METHOCARBAMOL 1000 MG/10ML IJ SOLN: 500.0000 mg | Freq: Four times a day (QID) | INTRAMUSCULAR | Status: DC | PRN

## 2024-03-16 MED ORDER — ACETAMINOPHEN 500 MG PO TABS
1000.0000 mg | ORAL_TABLET | Freq: Once | ORAL | Status: AC
  Administered 2024-03-16: 1000 mg via ORAL
  Filled 2024-03-16: qty 2

## 2024-03-16 MED ORDER — MENTHOL 3 MG MT LOZG: 1.0000 | LOZENGE | OROMUCOSAL | Status: DC | PRN

## 2024-03-16 MED ORDER — ACETAMINOPHEN 325 MG PO TABS: 325.0000 mg | ORAL_TABLET | Freq: Four times a day (QID) | ORAL | Status: DC | PRN

## 2024-03-16 MED ORDER — METOCLOPRAMIDE HCL 5 MG PO TABS: 5.0000 mg | ORAL_TABLET | Freq: Three times a day (TID) | ORAL | Status: DC | PRN

## 2024-03-16 MED ORDER — LACTATED RINGERS IV SOLN: INTRAVENOUS | Status: DC

## 2024-03-16 MED ORDER — PROPOFOL 10 MG/ML IV BOLUS
INTRAVENOUS | Status: DC | PRN
  Administered 2024-03-16: 20 mg via INTRAVENOUS
  Administered 2024-03-16: 50 ug/kg/min via INTRAVENOUS

## 2024-03-16 MED ORDER — AMISULPRIDE (ANTIEMETIC) 5 MG/2ML IV SOLN: 10.0000 mg | Freq: Once | INTRAVENOUS | Status: DC | PRN

## 2024-03-16 MED ORDER — ORAL CARE MOUTH RINSE: 15.0000 mL | Freq: Once | OROMUCOSAL | Status: AC

## 2024-03-16 MED ORDER — PHENYLEPHRINE 80 MCG/ML (10ML) SYRINGE FOR IV PUSH (FOR BLOOD PRESSURE SUPPORT)
PREFILLED_SYRINGE | INTRAVENOUS | Status: AC
Start: 2024-03-16 — End: 2024-03-16
  Filled 2024-03-16: qty 10

## 2024-03-16 MED ORDER — FENTANYL CITRATE (PF) 100 MCG/2ML IJ SOLN: 25.0000 ug | INTRAMUSCULAR | Status: DC | PRN

## 2024-03-16 MED ORDER — HYDROCODONE-ACETAMINOPHEN 7.5-325 MG PO TABS: 1.0000 | ORAL_TABLET | ORAL | Status: DC | PRN

## 2024-03-16 MED ORDER — DIPHENHYDRAMINE HCL 12.5 MG/5ML PO ELIX: 12.5000 mg | ORAL_SOLUTION | ORAL | Status: DC | PRN

## 2024-03-16 MED ORDER — LIDOCAINE 2% (20 MG/ML) 5 ML SYRINGE
INTRAMUSCULAR | Status: DC | PRN
  Administered 2024-03-16: 40 mg via INTRAVENOUS

## 2024-03-16 MED ORDER — LEVOTHYROXINE SODIUM 75 MCG PO TABS
75.0000 ug | ORAL_TABLET | Freq: Every day | ORAL | Status: DC
  Administered 2024-03-17 – 2024-03-20 (×4): 75 ug via ORAL
  Filled 2024-03-16 (×4): qty 1

## 2024-03-16 MED ORDER — PREGABALIN 75 MG PO CAPS
150.0000 mg | ORAL_CAPSULE | Freq: Every day | ORAL | Status: DC
  Administered 2024-03-16 – 2024-03-19 (×4): 150 mg via ORAL
  Filled 2024-03-16 (×4): qty 2

## 2024-03-16 MED ORDER — ALUM & MAG HYDROXIDE-SIMETH 200-200-20 MG/5ML PO SUSP: 30.0000 mL | ORAL | Status: DC | PRN

## 2024-03-16 MED ORDER — APIXABAN 5 MG PO TABS
5.0000 mg | ORAL_TABLET | Freq: Two times a day (BID) | ORAL | Status: DC
  Administered 2024-03-17 – 2024-03-20 (×7): 5 mg via ORAL
  Filled 2024-03-16 (×7): qty 1

## 2024-03-16 MED ORDER — TRANEXAMIC ACID-NACL 1000-0.7 MG/100ML-% IV SOLN
1000.0000 mg | INTRAVENOUS | Status: AC
  Administered 2024-03-16: 1000 mg via INTRAVENOUS
  Filled 2024-03-16: qty 100

## 2024-03-16 MED ORDER — CHLORHEXIDINE GLUCONATE 0.12 % MT SOLN
15.0000 mL | Freq: Once | OROMUCOSAL | Status: AC
  Administered 2024-03-16: 15 mL via OROMUCOSAL
  Filled 2024-03-16: qty 15

## 2024-03-16 MED ORDER — METHOCARBAMOL 500 MG PO TABS
500.0000 mg | ORAL_TABLET | Freq: Four times a day (QID) | ORAL | Status: DC | PRN
  Administered 2024-03-16: 500 mg via ORAL
  Filled 2024-03-16: qty 1

## 2024-03-16 MED ORDER — PROPOFOL 1000 MG/100ML IV EMUL
INTRAVENOUS | Status: AC
  Filled 2024-03-16: qty 100

## 2024-03-16 MED ORDER — SODIUM CHLORIDE 0.9 % IV SOLN: INTRAVENOUS | Status: DC

## 2024-03-16 MED ORDER — PREGABALIN 75 MG PO CAPS
75.0000 mg | ORAL_CAPSULE | Freq: Every day | ORAL | Status: DC
  Administered 2024-03-17 – 2024-03-20 (×4): 75 mg via ORAL
  Filled 2024-03-16 (×4): qty 1

## 2024-03-16 MED ORDER — PREGABALIN 75 MG PO CAPS: 75.0000 mg | ORAL_CAPSULE | Freq: Three times a day (TID) | ORAL | Status: DC

## 2024-03-16 MED ORDER — STERILE WATER FOR IRRIGATION IR SOLN
Status: DC | PRN
  Administered 2024-03-16: 1000 mL

## 2024-03-16 MED ORDER — METOCLOPRAMIDE HCL 5 MG/ML IJ SOLN: 5.0000 mg | Freq: Three times a day (TID) | INTRAMUSCULAR | Status: DC | PRN

## 2024-03-16 SURGICAL SUPPLY — 43 items
APPLICATOR CHLORAPREP 10.5 ORG (MISCELLANEOUS) IMPLANT
BAG COUNTER SPONGE SURGICOUNT (BAG) ×1 IMPLANT
BENZOIN TINCTURE PRP APPL 2/3 (GAUZE/BANDAGES/DRESSINGS) ×1 IMPLANT
BLADE CLIPPER SURG (BLADE) IMPLANT
BLADE SAW SGTL 18X1.27X75 (BLADE) ×1 IMPLANT
COVER SURGICAL LIGHT HANDLE (MISCELLANEOUS) ×1 IMPLANT
CUP SECTOR GRIPTON 50MM (Cup) IMPLANT
DRAPE C-ARM 42X72 X-RAY (DRAPES) ×1 IMPLANT
DRAPE INCISE 23X17 STRL (DRAPES) IMPLANT
DRAPE SURG ISO 125X83 STRL (DRAPES) IMPLANT
DRAPE U-SHAPE 47X51 STRL (DRAPES) ×3 IMPLANT
DRSG AQUACEL AG ADV 3.5X10 (GAUZE/BANDAGES/DRESSINGS) ×1 IMPLANT
DURAPREP 26ML APPLICATOR (WOUND CARE) ×1 IMPLANT
ELECT BLADE 6.5 EXT (BLADE) IMPLANT
ELECTRODE BLDE 4.0 EZ CLN MEGD (MISCELLANEOUS) ×1 IMPLANT
ELECTRODE REM PT RTRN 9FT ADLT (ELECTROSURGICAL) ×1 IMPLANT
FACESHIELD WRAPAROUND OR TEAM (MASK) ×2 IMPLANT
GLOVE BIOGEL PI IND STRL 8 (GLOVE) ×2 IMPLANT
GLOVE ECLIPSE 8.0 STRL XLNG CF (GLOVE) ×1 IMPLANT
GLOVE ORTHO TXT STRL SZ7.5 (GLOVE) ×2 IMPLANT
GOWN STRL REUS W/ TWL LRG LVL3 (GOWN DISPOSABLE) ×2 IMPLANT
GOWN STRL REUS W/ TWL XL LVL3 (GOWN DISPOSABLE) ×2 IMPLANT
HEAD FEM STD 32X+1 STRL (Hips) IMPLANT
KIT BASIN OR (CUSTOM PROCEDURE TRAY) ×1 IMPLANT
KIT TURNOVER KIT B (KITS) ×1 IMPLANT
LINER ACETABULAR 32X50 (Liner) IMPLANT
MANIFOLD NEPTUNE II (INSTRUMENTS) ×1 IMPLANT
PACK TOTAL JOINT (CUSTOM PROCEDURE TRAY) ×1 IMPLANT
PAD ARMBOARD POSITIONER FOAM (MISCELLANEOUS) ×1 IMPLANT
SET HNDPC FAN SPRY TIP SCT (DISPOSABLE) ×1 IMPLANT
SOLN 0.9% NACL POUR BTL 1000ML (IV SOLUTION) ×1 IMPLANT
SOLN STERILE WATER BTL 1000 ML (IV SOLUTION) ×2 IMPLANT
STAPLER SKIN PROX 35W (STAPLE) IMPLANT
STEM FEM SZ3 STD ACTIS (Stem) IMPLANT
STRIP CLOSURE SKIN 1/2X4 (GAUZE/BANDAGES/DRESSINGS) ×2 IMPLANT
SUT ETHIBOND NAB CT1 #1 30IN (SUTURE) ×1 IMPLANT
SUT MNCRL AB 4-0 PS2 18 (SUTURE) IMPLANT
SUT VIC AB 0 CT1 27XBRD ANBCTR (SUTURE) ×1 IMPLANT
SUT VIC AB 1 CT1 27XBRD ANBCTR (SUTURE) ×1 IMPLANT
SUT VIC AB 2-0 CT1 TAPERPNT 27 (SUTURE) ×1 IMPLANT
TOWEL GREEN STERILE (TOWEL DISPOSABLE) ×1 IMPLANT
TOWEL GREEN STERILE FF (TOWEL DISPOSABLE) ×1 IMPLANT
TRAY FOLEY W/BAG SLVR 16FR ST (SET/KITS/TRAYS/PACK) IMPLANT

## 2024-03-16 NOTE — Transfer of Care (Signed)
 Immediate Anesthesia Transfer of Care Note  Patient: [name]  Procedure(s) Performed: ARTHROPLASTY, HIP, TOTAL, ANTERIOR APPROACH LEFT (Left: Hip)  Patient Location: PACU  Anesthesia Type:MAC and Spinal  Level of Consciousness: awake and alert   Airway & Oxygen  Therapy: Patient Spontanous Breathing  Post-op Assessment: Report given to RN and Post -op Vital signs reviewed and stable  Post vital signs: Reviewed and stable  Last Vitals:  Vitals Value Taken Time  BP 100/54 03/16/24 09:22  Temp    Pulse 54 03/16/24 09:24  Resp 20 03/16/24 09:24  SpO2 94 % 03/16/24 09:24  Vitals shown include unfiled device data.  Last Pain:  Vitals:   03/16/24 0616  TempSrc:   PainSc: 0-No pain         Complications: No notable events documented.

## 2024-03-16 NOTE — Interval H&P Note (Signed)
 History and Physical Interval Note: The patient is a 78 year old female who understands that she is here today for a left total hip replacement to treat her significant left hip pain and arthritis.  There has been no acute or interval change in her medical status.  The risks and benefits of surgery have been discussed in detail and informed consent has been obtained.  The left operative hip has been marked.  03/16/2024 7:16 AM  [name]  has presented today for surgery, with the diagnosis of osteoarthritis left hip.  The various methods of treatment have been discussed with the patient and family. After consideration of risks, benefits and other options for treatment, the patient has consented to  Procedure(s): ARTHROPLASTY, HIP, TOTAL, ANTERIOR APPROACH (Left) as a surgical intervention.  The patient's history has been reviewed, patient examined, no change in status, stable for surgery.  I have reviewed the patient's chart and labs.  Questions were answered to the patient's satisfaction.     [name]

## 2024-03-16 NOTE — TOC Initial Note (Signed)
 Transition of Care (TOC) - Initial/Assessment Note   Spoke to patient and husband at bedside.   MD office has arranged home health PT with [facility] and they are in agreement. Confined with Artvia with [facility].   MD ordered rolling walker and bedside commode. Patient has bedside commode, raised commode and walk in tub at home.   Patient also has a cane .   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 NCM sent message to PT and MD regarding order for rolling walker , await response  Patient Details  Name: [name] MRN: [id number] Date of Birth: Nov 02, 1945  Transition of Care [facility]) CM/SW Contact:    Stephane Powell Jansky, RN Phone Number: 03/16/2024, 2:55 PM  Clinical Narrative:                   Expected Discharge Plan: Home w Home Health Services Barriers to Discharge: Continued Medical Work up   Patient Goals and CMS Choice Patient states their goals for this hospitalization and ongoing recovery are:: to return to home CMS Medicare.gov Compare Post Acute Care list provided to:: Patient Choice offered to / list presented to : Patient      Expected Discharge Plan and Services   Discharge Planning Services: CM Consult Post Acute Care Choice: Home Health Living arrangements for the past 2 months: Single Family Home                 DME Arranged:  (see note)         HH Arranged: PT HH Agency: [facility] ([facility]) Date HH Agency Contacted: 03/16/24 Time HH Agency Contacted: 1454 Representative spoke with at [employer] Agency: [facility]  Prior Living Arrangements/Services Living arrangements for the past 2 months: Single Family Home Lives with:: Spouse Patient language and need for interpreter reviewed:: Yes Do you feel safe going back to the place where you live?: Yes      Need for Family Participation in Patient Care: Yes (Comment) Care giver support system in place?: Yes (comment) Current home services: DME Criminal Activity/Legal Involvement Pertinent to  Current Situation/Hospitalization: No - Comment as needed  Activities of Daily Living      Permission Sought/Granted   Permission granted to share information with : Yes, Verbal Permission Granted  Share Information with NAME: spouse [name]  Permission granted to share info w AGENCY: [facility]        Emotional Assessment Appearance:: Appears stated age Attitude/Demeanor/Rapport: Engaged Affect (typically observed): Appropriate Orientation: : Oriented to Self, Oriented to Place, Oriented to  Time, Oriented to Situation Alcohol  / Substance Use: Not Applicable Psych Involvement: No (comment)  Admission diagnosis:  Primary osteoarthritis of left hip [M16.12] Status post total replacement of left hip [Z96.642] Patient Active Problem List   Diagnosis Date Noted   Status post total replacement of left hip 03/16/2024   Unilateral primary osteoarthritis, left hip 09/06/2023   Bilateral pulmonary embolism (HCC) 10/17/2021   Bilateral pleural effusion 10/14/2021   Prolonged QT interval 10/14/2021   Acquired hypothyroidism 10/14/2021   Essential hypertension 10/14/2021   Syncopal episodes 09/27/2021   Subdural hematoma (HCC) 09/27/2021   Acute cystitis without hematuria 07/12/2021   Closed fracture of right distal radius and ulna 02/23/2019   Chronic insomnia 12/15/2018   Restless leg syndrome 12/15/2018   Paresthesia 12/15/2018   PCP:  Courtney Leta NOVAK, MD Pharmacy:   [facility] [place], [place] - [address] [place] [address]  Phone: [phone] Fax: [phone]  [facility] - [place], [place] - [address] [place] [address] Phone: [phone] Fax: [phone]  [id number] - [place], [place] - [address] AT [facility] OF [address] [place] [address] Phone: [phone] Fax: [phone]     Social Drivers of Health  (SDOH) Social History: SDOH Screenings   Food Insecurity: No Food Insecurity (07/31/2021)   Received from [facility]  Transportation Needs: No Transportation Needs (07/31/2021)   Received from [facility]  Financial Resource Strain: Low Risk  (07/31/2021)   Received from [facility]  Tobacco Use: Low Risk  (03/16/2024)   SDOH Interventions:     Readmission Risk Interventions    10/17/2021   12:19 PM 10/02/2021    3:25 PM  Readmission Risk Prevention Plan  Transportation Screening Complete Complete  PCP or Specialist Appt within 5-7 Days  Complete  Home Care Screening  Complete  Medication Review (RN CM)  Complete  HRI or Home Care Consult Complete   Social Work Consult for Recovery Care Planning/Counseling Complete   Palliative Care Screening Not Applicable   Medication Review Oceanographer) Complete

## 2024-03-16 NOTE — Anesthesia Procedure Notes (Signed)
 Spinal  Patient location during procedure: OR Start time: 03/16/2024 7:39 AM End time: 03/16/2024 7:47 AM Reason for block: surgical anesthesia Staffing Performed: anesthesiologist  Anesthesiologist: Darlyn Rush, MD Performed by: Darlyn Rush, MD Authorized by: Darlyn Rush, MD   Preanesthetic Checklist Completed: patient identified, IV checked, site marked, risks and benefits discussed, surgical consent, monitors and equipment checked, pre-op evaluation and timeout performed Spinal Block Patient position: sitting Prep: DuraPrep and site prepped and draped Patient monitoring: heart rate, continuous pulse ox and blood pressure Approach: right paramedian Location: L3-4 Injection technique: single-shot Needle Needle type: Spinocan  Needle gauge: 25 G Needle length: 9 cm Additional Notes Expiration date of kit checked and confirmed. Patient tolerated procedure well, without complications.

## 2024-03-16 NOTE — Evaluation (Signed)
 Physical Therapy Evaluation Patient Details Name: [name] MRN: [id number] DOB: 1945/11/08 Today's Date: 03/16/2024  History of Present Illness  78 yo female s/p L DA-THA 10/23. 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  Clinical Impression  Pt presents with L hip pain, generalized weakness L>R given post-op, impaired balance with history of falls (>6 months ago), antalgic gait, and decreased activity tolerance. Pt to benefit from acute PT to address deficits. Pt ambulated x3 ft with use of L platform RW, cues for sequencing and assist to steady/manage RW. Pt educated on ankle pumps (20/hour) to perform this afternoon/evening to increase circulation, to pt's tolerance and limited by pain. PT to progress mobility as tolerated, and will continue to follow acutely.             If plan is discharge home, recommend the following: A little help with walking and/or transfers;A little help with bathing/dressing/bathroom   Can travel by private vehicle        Equipment Recommendations Rolling walker (2 wheels) (with LUE platform)  Recommendations for Other Services       Functional Status Assessment Patient has had a recent decline in their functional status and demonstrates the ability to make significant improvements in function in a reasonable and predictable amount of time.     Precautions / Restrictions Precautions Precautions: Fall Recall of Precautions/Restrictions: Impaired Precaution/Restrictions Comments: L DA THA, no hip precautions and WBAT Restrictions Weight Bearing Restrictions Per Provider Order: No      Mobility  Bed Mobility Overal bed mobility: Needs Assistance Bed Mobility: Supine to Sit     Supine to sit: Min assist, Used rails, HOB elevated     General bed mobility comments: assist for trunk elevation, scooting to EOB.    Transfers Overall transfer level: Needs  assistance Equipment used: Left platform walker Transfers: Sit to/from Stand Sit to Stand: Min assist           General transfer comment: light rise and steady assist, cues for hand placement when rising and sitting. Poor eccentric control when moving stand>sit    Ambulation/Gait Ambulation/Gait assistance: Min assist Gait Distance (Feet): 3 Feet Assistive device: Left platform walker Gait Pattern/deviations: Step-to pattern, Decreased step length - left, Decreased stance time - left, Trunk flexed, Antalgic Gait velocity: decr     General Gait Details: assist to steady, guide RW, cues for sequencing with short step length and upright posture.  Stairs            Wheelchair Mobility     Tilt Bed    Modified Rankin (Stroke Patients Only)       Balance Overall balance assessment: Needs assistance Sitting-balance support: No upper extremity supported, Feet supported Sitting balance-Leahy Scale: Fair     Standing balance support: Bilateral upper extremity supported, During functional activity, Reliant on assistive device for balance Standing balance-Leahy Scale: Poor                               Pertinent Vitals/Pain Pain Assessment Pain Assessment: Faces Faces Pain Scale: Hurts little more Pain Location: L hip Pain Descriptors / Indicators: Guarding, Grimacing, Discomfort Pain Intervention(s): Repositioned, Limited activity within patient's tolerance, Monitored during session    Home Living Family/patient expects to be discharged to:: Private residence Living Arrangements: Spouse/significant other Available Help at Discharge: Family;Available 24 hours/day Type of Home: House Home  Access: Ramped entrance       Home Layout: One level Home Equipment: BSC/3in1;Shower seat;Cane - single point;Hand held shower head      Prior Function Prior Level of Function : Independent/Modified Independent             Mobility Comments: uses cane as  needed for balance ADLs Comments: husband assist as I need him to, but is mostly independent at baseline     Extremity/Trunk Assessment   Upper Extremity Assessment Upper Extremity Assessment: Defer to OT evaluation    Lower Extremity Assessment Lower Extremity Assessment: Generalized weakness;LLE deficits/detail LLE Deficits / Details: anticipated post-operative weakness; able to perform ankle pump, quad set, very limited ROM heel slide, very limited ROM hip abd/add LLE: Unable to fully assess due to pain    Cervical / Trunk Assessment Cervical / Trunk Assessment: Normal  Communication   Communication Communication: No apparent difficulties    Cognition Arousal: Alert Behavior During Therapy: WFL for tasks assessed/performed   PT - Cognitive impairments: No apparent impairments                         Following commands: Intact       Cueing Cueing Techniques: Verbal cues, Gestural cues     General Comments General comments (skin integrity, edema, etc.): Pt endorsing cramping sensation at L achilles, not present in WB    Exercises     Assessment/Plan    PT Assessment Patient needs continued PT services  PT Problem List Decreased strength;Decreased mobility;Decreased activity tolerance;Decreased knowledge of use of DME;Decreased balance;Pain;Decreased range of motion;Decreased safety awareness;Decreased knowledge of precautions       PT Treatment Interventions DME instruction;Therapeutic activities;Gait training;Therapeutic exercise;Patient/family education;Balance training;Functional mobility training;Neuromuscular re-education    PT Goals (Current goals can be found in the Care Plan section)  Acute Rehab PT Goals Patient Stated Goal: home PT Goal Formulation: With patient Time For Goal Achievement: 03/30/24 Potential to Achieve Goals: Good    Frequency 7X/week     Co-evaluation               AM-PAC PT 6 Clicks Mobility  Outcome  Measure Help needed turning from your back to your side while in a flat bed without using bedrails?: A Little Help needed moving from lying on your back to sitting on the side of a flat bed without using bedrails?: A Little Help needed moving to and from a bed to a chair (including a wheelchair)?: A Little Help needed standing up from a chair using your arms (e.g., wheelchair or bedside chair)?: A Little Help needed to walk in hospital room?: A Lot Help needed climbing 3-5 steps with a railing? : A Lot 6 Click Score: 16    End of Session Equipment Utilized During Treatment: Gait belt Activity Tolerance: Patient tolerated treatment well;Patient limited by pain Patient left: in chair;with call bell/phone within reach;with chair alarm set Nurse Communication: Mobility status PT Visit Diagnosis: Other abnormalities of gait and mobility (R26.89);Muscle weakness (generalized) (M62.81)    Time: 8374-8347 PT Time Calculation (min) (ACUTE ONLY): 27 min   Charges:   PT Evaluation $PT Eval Low Complexity: 1 Low PT Treatments $Therapeutic Activity: 8-22 mins PT General Charges $$ ACUTE PT VISIT: 1 Visit         Johana RAMAN, PT DPT Acute Rehabilitation Services Secure Chat Preferred  Office [phone]   Carlyn Mullenbach E Johna 03/16/2024, 5:04 PM

## 2024-03-16 NOTE — Plan of Care (Signed)
  Problem: Education: Goal: Knowledge of General Education information will improve Description: Including pain rating scale, medication(s)/side effects and non-pharmacologic comfort measures Outcome: Progressing   Problem: Activity: Goal: Risk for activity intolerance will decrease Outcome: Progressing   Problem: Pain Managment: Goal: General experience of comfort will improve and/or be controlled Outcome: Progressing

## 2024-03-16 NOTE — Op Note (Signed)
 Operative Note  Date of operation: 03/16/2024 Preoperative diagnosis: Left hip primary osteoarthritis Postoperative diagnosis: Same  Procedure: Left direct anterior total hip arthroplasty  Implants: Implant Name Type Inv. Item Serial No. Manufacturer Lot No. LRB No. Used Action  CUP SECTOR GRIPTON - [id number] Cup CUP SECTOR GRIPTON  [employer]PU[employer] [id number] Left 1 Implanted  LINER ACETABULAR 32X50 - [id number] Liner LINER ACETABULAR 32X50  DEPUY ORTHOPAEDICS [id number] Left 1 Implanted  STEM FEM SZ3 STD ACTIS - [id number] Stem STEM FEM SZ3 STD ACTIS  DEPUY ORTHOPAEDICS [id number] Left 1 Implanted  HEAD FEM STD 32X+1 STRL - [id number] Hips HEAD FEM STD 32X+1 STRL  DEPUY ORTH[employer] [id number] Left 1 Implanted   Surgeon: Lonni GRADE. Vernetta, MD Assistant: Tory Gaskins, PA-C  Anesthesia: Spinal EBL: 100 cc Antibiotics: IV Ancef  Complications: None  Indications: The patient is a very pleasant 78 year old female with debilitating arthritis involving her left hip.  She has tried and failed all forms conservative treatment.  She has had 2 intra-articular steroid injections which offered minimal help.  At this point her left hip pain is daily and it is detrimentally affecting her mobility, quality of life and actives day living to the point she does wish to proceed with a total hip arthroplasty on the left side.  We did discuss the risks of acute blood loss anemia, nerve and vessel injury, fracture, infection, DVT, dislocation, implant failure, leg length differences and wound healing issues.  She understands that our goals are hopefully decreased pain, improved mobility and overall improved quality of life.  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  Procedure description: After informed consent was obtained and appropriate left hip was marked, the patient was brought to the operating room and  set up on the stretcher where spinal anesthesia was obtained.  She was then laid in the supine position on stretcher and a Foley catheter was placed.  Next traction boots were placed on both her feet and she was placed supine on the Hana fracture table with a perineal post and placed in both legs in inline skeletal traction devices but no traction applied.  Her left operative hip and pelvis were assessed radiographically.  The left hip was prepped and draped with DuraPrep and sterile drapes.  A timeout was called and she was identified as the correct patient correct left hip.  An incision was then made just inferior and posterior to the ASIS and carried slightly obliquely down the leg.  Dissection was carried down to the tensor fascia lata muscle and the tensor fascia was divided longitudinally to proceed with a direct intra approach the hip.  Circumflex vessels were identified and cauterized.  The hip capsule was identified and opened up in an L-type format finding a moderate joint effusion.  Cobra retractor placed around the medial and lateral femoral neck and a femoral neck cut was made with an oscillating saw just proximal to the lesser trochanter and this cut was completed with an osteotome.  A corkscrew gauze placed in the femoral head and the femoral head was removed in its entirety and there was a wide area devoid of cartilage.  A bent Hohmann was then placed over the medial acetabular rim and remanence of the acetabular labrum and other debris removed.  Reaming was initiated from a size 43 reamer and stepwise increments going up to a size 49 reamer with all reamers placed under  direct visualization and the last reamer was also placed under direct fluoroscopy in order to obtain the depth of reaming, the inclination and the anteversion.  The real DePuy sector GRIPTION acetabular implant was then placed without difficulty followed by a 32+0 polyethylene liner.  Attention was then turned to the femur.  With the  left leg externally rotated to 120 degrees, extended and adducted, angular retractors placed medially and Hohmann tractor by the greater trochanter.  The lateral joint capsule was released and a box cutting osteotome was used into the femoral canal.  Broaching was then initiated using the Actis broaching system from a size 0 going up to a size 3.  With a size 3 in place we trialed a standard offset femoral neck and a 32+1 trial head ball.  The left leg was brought over and up and with traction into rotation reduced in the pelvis.  It was stable radiographically and clinically and her offset was improved but I do feel like she is just a touch along.  We dislocated the hip through the trial components.  We then placed the real Actis femoral component size 3 with standard offset followed by a 32+1 metal head ball.  Again this was reduced in the pelvis and we are pleased with clinical and radiographic assessment in terms of stability.  We then irrigated the soft tissue normal saline solution.  The joint capsule was closed with interrupted #1 Ethibond suture followed by number Vicryl close tensor fascia.  0 Vicryl is used to close the deep tissue and 2-0 Vicryl was used to close subcutaneous tissue.  The skin was closed with staples.  Aquacel dressings applied.  Patient was taken off the Hana table and taken to the recovery room.  Tory Gaskins, PA-C did assist during the entire case and beginning to end and his assistance was crucial and medically necessary for soft tissue management and retraction, helping guide implant placement and a layered closure of the wound.

## 2024-03-16 NOTE — Progress Notes (Signed)
 Pt had Olmesartan this am. Dr Darlyn is aware

## 2024-03-16 NOTE — Anesthesia Postprocedure Evaluation (Signed)
 Anesthesia Post Note  Patient: [name]  Procedure(s) Performed: ARTHROPLASTY, HIP, TOTAL, ANTERIOR APPROACH LEFT (Left: Hip)     Patient location during evaluation: PACU Anesthesia Type: Spinal Level of consciousness: awake and alert Pain management: pain level controlled Vital Signs Assessment: post-procedure vital signs reviewed and stable Respiratory status: spontaneous breathing Cardiovascular status: stable Anesthetic complications: no   No notable events documented.  Last Vitals:  Vitals:   03/16/24 1130 03/16/24 1222  BP: 137/79 (!) 164/75  Pulse: 60 63  Resp: 12 18  Temp:  36.5 C  SpO2: 97% 99%    Last Pain:  Vitals:   03/16/24 1314  TempSrc:   PainSc: 6                  Juanpablo Ciresi [employer]

## 2024-03-17 ENCOUNTER — Encounter (HOSPITAL_COMMUNITY): Payer: Self-pay | Admitting: Orthopaedic Surgery

## 2024-03-17 DIAGNOSIS — Z8542 Personal history of malignant neoplasm of other parts of uterus: Secondary | ICD-10-CM | POA: Diagnosis not present

## 2024-03-17 DIAGNOSIS — I110 Hypertensive heart disease with heart failure: Secondary | ICD-10-CM | POA: Diagnosis not present

## 2024-03-17 DIAGNOSIS — E039 Hypothyroidism, unspecified: Secondary | ICD-10-CM | POA: Diagnosis not present

## 2024-03-17 DIAGNOSIS — Z7901 Long term (current) use of anticoagulants: Secondary | ICD-10-CM | POA: Diagnosis not present

## 2024-03-17 DIAGNOSIS — I509 Heart failure, unspecified: Secondary | ICD-10-CM | POA: Diagnosis not present

## 2024-03-17 DIAGNOSIS — M1612 Unilateral primary osteoarthritis, left hip: Secondary | ICD-10-CM | POA: Diagnosis not present

## 2024-03-17 DIAGNOSIS — Z79899 Other long term (current) drug therapy: Secondary | ICD-10-CM | POA: Diagnosis not present

## 2024-03-17 LAB — BASIC METABOLIC PANEL WITH GFR
Anion gap: 9 (ref 5–15)
BUN: 17 mg/dL (ref 8–23)
CO2: 21 mmol/L — ABNORMAL LOW (ref 22–32)
Calcium: 8.4 mg/dL — ABNORMAL LOW (ref 8.9–10.3)
Chloride: 104 mmol/L (ref 98–111)
Creatinine, Ser: 0.71 mg/dL (ref 0.44–1.00)
GFR, Estimated: >60 mL/min (ref >60)
Glucose, Bld: 135 mg/dL — ABNORMAL HIGH (ref 70–99)
Potassium: 3.3 mmol/L — ABNORMAL LOW (ref 3.5–5.1)
Sodium: 134 mmol/L — ABNORMAL LOW (ref 135–145)

## 2024-03-17 LAB — CBC
HCT: 31.8 % — ABNORMAL LOW (ref 36.0–46.0)
Hemoglobin: 10.7 g/dL — ABNORMAL LOW (ref 12.0–15.0)
MCH: 32.7 pg (ref 26.0–34.0)
MCHC: 33.6 g/dL (ref 30.0–36.0)
MCV: 97.2 fL (ref 80.0–100.0)
Platelets: 190 K/uL (ref 150–400)
RBC: 3.27 MIL/uL — ABNORMAL LOW (ref 3.87–5.11)
RDW: 12.7 % (ref 11.5–15.5)
WBC: 8.9 K/uL (ref 4.0–10.5)
nRBC: 0 % (ref 0.0–0.2)

## 2024-03-17 NOTE — Plan of Care (Signed)
  Problem: Clinical Measurements: Goal: Will remain free from infection Outcome: Progressing   Problem: Nutrition: Goal: Adequate nutrition will be maintained Outcome: Progressing   Problem: Coping: Goal: Level of anxiety will decrease Outcome: Progressing   Problem: Safety: Goal: Ability to remain free from injury will improve Outcome: Progressing

## 2024-03-17 NOTE — TOC Progression Note (Signed)
 Transition of Care (TOC) - Progression Note   Patient will need left platform walker . NCM entered enter ordered secure chatted MD to sign.   Ordered with Jermaine with [employer]  Patient Details  Name: [name] MRN: [id number] Date of Birth: [birth year]  Transition of Care [facility]) CM/SW Contact  Danissa Rundle, Powell Jansky, RN Phone Number: 03/17/2024, 9:25 AM  Clinical Narrative:       Expected Discharge Plan: Home w Home Health Services Barriers to Discharge: Continued Medical Work up               Expected Discharge Plan and Services   Discharge Planning Services: CM Consult Post Acute Care Choice: Home Health Living arrangements for the past 2 months: Single Family Home                 DME Arranged:  (see note)         HH Arranged: PT HH Agency: [facility] Date HH Agency Contacted: 03/16/24 Time HH Agency Contacted: 1454 Representative spoke with at [facility] Agency: [facility]   Social Drivers of Health (SDOH) Interventions SDOH Screenings   Food Insecurity: No Food Insecurity (03/16/2024)  Housing: Low Risk  (03/16/2024)  Transportation Needs: No Transportation Needs (03/16/2024)  Utilities: Not At Risk (03/16/2024)  Financial Resource Strain: Low Risk  (07/31/2021)   [facility]  Social Connections: Socially Integrated (03/16/2024)  Tobacco Use: Low Risk  (03/16/2024)    Readmission Risk Interventions    10/17/2021   12:19 PM 10/02/2021    3:25 PM  Readmission Risk Prevention Plan  Transportation Screening Complete Complete  PCP or Specialist Appt within 5-7 Days  Complete  Home Care Screening  Complete  Medication Review (RN CM)  Complete  HRI or Home Care Consult Complete   Social Work Consult for Recovery Care Planning/Counseling Complete   Palliative Care Screening Not Applicable   Medication Review Oceanographer) Complete

## 2024-03-17 NOTE — Evaluation (Signed)
 Occupational Therapy Evaluation Patient Details Name: [name] MRN: [id number] DOB: Feb 25, 1946 Today's Date: 03/17/2024   History of Present Illness   78 yo female s/p L DA-THA 10/23. 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     Clinical Impressions This 78 yo female admitted and underwent above presents to acute OT with PLOF of being mostly independent to Mod I with basic ADLs and mobility. Currently she is setup-total A for basic ADLs and min-mod A for basic  mobility from a left platform RW. Pt moves very slowly and needs increased A for all ADLs and associated mobility due to her pain and has LUE amputation. She will continue to benefit from acute OT with follow from continued inpatient follow up therapy, <3 hours/day being primary recommendation.      If plan is discharge home, recommend the following:   A lot of help with walking and/or transfers;A lot of help with bathing/dressing/bathroom;Assistance with cooking/housework;Help with stairs or ramp for entrance;Assist for transportation     Functional Status Assessment   Patient has had a recent decline in their functional status and demonstrates the ability to make significant improvements in function in a reasonable and predictable amount of time.     Equipment Recommendations   None recommended by OT      Precautions/Restrictions   Precautions Precautions: Fall Recall of Precautions/Restrictions: Impaired Precaution/Restrictions Comments: L DA THA, no hip precautions and WBAT Restrictions Weight Bearing Restrictions Per Provider Order: No     Mobility Bed Mobility Overal bed mobility: Needs Assistance Bed Mobility: Sit to Supine     Supine to sit: Mod assist, HOB elevated Sit to supine: Mod assist, Used rails, HOB elevated   General bed mobility comments: A for Bil LEs in and OOB and trunk OOB (with HOB up)     Transfers Overall transfer level: Needs assistance Equipment used: Left platform walker Transfers: Sit to/from Stand, Bed to chair/wheelchair/BSC Sit to Stand: Mod assist Stand pivot transfers: Min assist         General transfer comment: Mod A sit<>stand      Balance Overall balance assessment: Needs assistance Sitting-balance support: No upper extremity supported, Feet supported Sitting balance-Leahy Scale: Fair     Standing balance support: Bilateral upper extremity supported, During functional activity, Reliant on assistive device for balance Standing balance-Leahy Scale: Poor                             ADL either performed or assessed with clinical judgement   ADL Overall ADL's : Needs assistance/impaired Eating/Feeding: Set up;Sitting   Grooming: Set up;Sitting   Upper Body Bathing: Minimal assistance;Sitting   Lower Body Bathing: Moderate assistance Lower Body Bathing Details (indicate cue type and reason): Mod A sit<>stand Upper Body Dressing : Minimal assistance;Sitting   Lower Body Dressing: Total assistance Lower Body Dressing Details (indicate cue type and reason): min A sit<>stand   Toilet Transfer Details (indicate cue type and reason): min A with left platform RW for transfer, Mod A sit<>stand Toileting- Clothing Manipulation and Hygiene: Minimal assistance Toileting - Clothing Manipulation Details (indicate cue type and reason): Mod A sit<>stand             Vision Baseline Vision/History: 1 Wears glasses Ability to See in Adequate Light: 0 Adequate Patient Visual Report: No change from baseline  Pertinent Vitals/Pain Pain Assessment Pain Assessment: 0-10 Pain Score: 5  Pain Location: L hip with certain movements Pain Descriptors / Indicators: Guarding, Grimacing, Discomfort, Sore Pain Intervention(s): Limited activity within patient's tolerance, Monitored during session, Repositioned     Extremity/Trunk  Assessment Upper Extremity Assessment Upper Extremity Assessment: Right hand dominant (LUE proximal amputation)           Communication Communication Communication: No apparent difficulties   Cognition Arousal: Alert Behavior During Therapy: WFL for tasks assessed/performed Cognition: No apparent impairments                               Following commands: Intact       Cueing    Cueing Techniques: Verbal cues;Gestural cues;Tactile cues              Home Living Family/patient expects to be discharged to:: Private residence Living Arrangements: Spouse/significant other (has dementia) Available Help at Discharge: Family;Available 24 hours/day (husband) Type of Home: House Home Access: Ramped entrance     Home Layout: One level     Bathroom Shower/Tub:  (walk in tub with hand held shower and built in seat)   Bathroom Toilet: Handicapped height     Home Equipment: BSC/3in1;Shower seat;Cane - single point;Hand held shower head          Prior Functioning/Environment Prior Level of Function : Independent/Modified Independent             Mobility Comments: uses cane as needed for balance ADLs Comments: husband assist as I need him to, but is mostly independent at baseline; puts underwear and pants on floor steps into them an dthe pulls them on    OT Problem List: Decreased strength;Decreased range of motion;Impaired balance (sitting and/or standing);Pain;Decreased knowledge of use of DME or AE   OT Treatment/Interventions: Self-care/ADL training;DME and/or AE instruction;Balance training;Patient/family education      OT Goals(Current goals can be found in the care plan section)   Acute Rehab OT Goals Patient Stated Goal: to move better and go home OT Goal Formulation: With patient Time For Goal Achievement: 03/31/24 Potential to Achieve Goals: Good   OT Frequency:  Min 2X/week       AM-PAC OT 6 Clicks Daily Activity      Outcome Measure Help from another person eating meals?: A Little Help from another person taking care of personal grooming?: A Little Help from another person toileting, which includes using toliet, bedpan, or urinal?: A Lot Help from another person bathing (including washing, rinsing, drying)?: A Lot Help from another person to put on and taking off regular upper body clothing?: A Little Help from another person to put on and taking off regular lower body clothing?: Total 6 Click Score: 14   End of Session Equipment Utilized During Treatment: Gait belt (L platform RW)  Activity Tolerance: Patient tolerated treatment well Patient left: in bed;with call bell/phone within reach;with family/visitor present  OT Visit Diagnosis: Unsteadiness on feet (R26.81);Other abnormalities of gait and mobility (R26.89);Muscle weakness (generalized) (M62.81);Pain Pain - Right/Left: Left Pain - part of body: Hip                Time: 8447-8351 OT Time Calculation (min): 56 min Charges:  OT General Charges $OT Visit: 1 Visit OT Evaluation $OT Eval Moderate Complexity: 1 Mod OT Treatments $Self Care/Home Management : 38-52 mins  Donny BECKER OT Acute Rehabilitation Services Office [phone]    Weaver Courtney Distel  03/17/2024, 5:19 PM

## 2024-03-17 NOTE — Progress Notes (Signed)
 Physical Therapy Treatment Patient Details Name: [name] MRN: [id number] DOB: 09-09-1945 Today's Date: 03/17/2024   History of Present Illness 78 yo female s/p L DA-THA 10/23. 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    PT Comments  Pt tolerated sitting up in recliner x 3 hours. Pt assisted to/from [facility] then back to bed for visit with her pastor. Mod assist sit to stand, and mod assist SPT with LPRW. LUE residual limb secure in platform for forward gait but not with pivoting toward R or backward steps. Requiring therapist to manage L side of RW during these movements. Pt supine in bed at end of session. PT to continue per current POC.     If plan is discharge home, recommend the following: A lot of help with walking and/or transfers;A lot of help with bathing/dressing/bathroom;Assist for transportation;Assistance with cooking/housework   Can travel by private vehicle        Equipment Recommendations  Rolling walker (2 wheels) (L platform)    Recommendations for Other Services       Precautions / Restrictions Precautions Precautions: Fall Recall of Precautions/Restrictions: Impaired Precaution/Restrictions Comments: L DA THA, no hip precautions and WBAT     Mobility  Bed Mobility Overal bed mobility: Needs Assistance Bed Mobility: Sit to Supine     Supine to sit: Mod assist, HOB elevated Sit to supine: Mod assist, Used rails, HOB elevated   General bed mobility comments: heavy assist with BLE back to bed, cues for sequencing    Transfers Overall transfer level: Needs assistance Equipment used: Left platform walker Transfers: Sit to/from Stand, Bed to chair/wheelchair/BSC Sit to Stand: Mod assist           General transfer comment: assist to power up and stabilize balance, pivot transfer with LPRW toward the R, recliner>BSC>bed    Ambulation/Gait Ambulation/Gait  assistance: Mod assist, +2 safety/equipment Gait Distance (Feet): 6 Feet Assistive device: Left platform walker Gait Pattern/deviations: Step-to pattern, Decreased step length - left, Decreased stance time - left, Trunk flexed, Antalgic, Decreased weight shift to left Gait velocity: decreased Gait velocity interpretation: <1.8 ft/sec, indicate of risk for recurrent falls   General Gait Details: Assist with LLE through swing phase, balance, and RW management. +2 for chair follow. Continual cues for sequencing   Stairs             Wheelchair Mobility     Tilt Bed    Modified Rankin (Stroke Patients Only)       Balance Overall balance assessment: Needs assistance Sitting-balance support: No upper extremity supported, Feet supported Sitting balance-Leahy Scale: Fair     Standing balance support: Bilateral upper extremity supported, During functional activity, Reliant on assistive device for balance Standing balance-Leahy Scale: Poor                              Communication Communication Communication: No apparent difficulties  Cognition Arousal: Alert Behavior During Therapy: WFL for tasks assessed/performed   PT - Cognitive impairments: No apparent impairments                         Following commands: Intact      Cueing Cueing Techniques: Verbal cues, Gestural cues, Tactile cues  Exercises Total Joint Exercises Ankle Circles/Pumps: AROM, Both, 10 reps Quad Sets: AROM, Both, 5 reps Gluteal Sets: AROM,  Both, 5 reps    General Comments General comments (skin integrity, edema, etc.): don sneakers for transfers/gait      Pertinent Vitals/Pain Pain Assessment Pain Assessment: Faces Faces Pain Scale: Hurts little more Pain Location: L hip Pain Descriptors / Indicators: Guarding, Grimacing, Discomfort Pain Intervention(s): Monitored during session, Repositioned    Home Living                          Prior Function             PT Goals (current goals can now be found in the care plan section) Acute Rehab PT Goals Patient Stated Goal: home Progress towards PT goals: Progressing toward goals    Frequency    7X/week      PT Plan      Co-evaluation              AM-PAC PT 6 Clicks Mobility   Outcome Measure  Help needed turning from your back to your side while in a flat bed without using bedrails?: A Little Help needed moving from lying on your back to sitting on the side of a flat bed without using bedrails?: A Lot Help needed moving to and from a bed to a chair (including a wheelchair)?: A Lot Help needed standing up from a chair using your arms (e.g., wheelchair or bedside chair)?: A Lot Help needed to walk in hospital room?: A Lot Help needed climbing 3-5 steps with a railing? : Total 6 Click Score: 12    End of Session Equipment Utilized During Treatment: Gait belt Activity Tolerance: Patient tolerated treatment well Patient left: in bed;with call bell/phone within reach;with family/visitor present Nurse Communication: Mobility status PT Visit Diagnosis: Other abnormalities of gait and mobility (R26.89);Muscle weakness (generalized) (M62.81)     Time: 8847-8783 PT Time Calculation (min) (ACUTE ONLY): 24 min  Charges:    $Gait Training: 8-22 mins $Therapeutic Activity: 8-22 mins PT General Charges $$ ACUTE PT VISIT: 1 Visit                     Sari MATSU., PT  Office # [phone]    [name] 03/17/2024, 12:34 PM

## 2024-03-17 NOTE — Progress Notes (Signed)
 Subjective: 1 Day Post-Op Procedure(s) (LRB): ARTHROPLASTY, HIP, TOTAL, ANTERIOR APPROACH LEFT (Left) Patient reports pain as moderate.  Husband is at the bedside.  Objective: Vital signs in last 24 hours: Temp:  [97.1 F (36.2 C)-98.8 F (37.1 C)] 98.4 F (36.9 C) (10/24 0400) Pulse Rate:  [47-99] 96 (10/24 0400) Resp:  [10-18] 17 (10/24 0400) BP: (95-164)/(54-87) 147/63 (10/24 0400) SpO2:  [92 %-99 %] 95 % (10/24 0400)  Intake/Output from previous day: 10/23 0701 - 10/24 0700 In: 831.3 [I.V.:631.3; IV Piggyback:200] Out: 1370 [Urine:1270; Blood:100] Intake/Output this shift: Total I/O In: 631.3 [I.V.:631.3] Out: 320 [Urine:320]  Recent Labs    03/17/24 0252  HGB 10.7*   Recent Labs    03/17/24 0252  WBC 8.9  RBC 3.27*  HCT 31.8*  PLT 190   Recent Labs    03/17/24 0252  NA 134*  K 3.3*  CL 104  CO2 21*  BUN 17  CREATININE 0.71  GLUCOSE 135*  CALCIUM 8.4*   No results for input(s): LABPT, INR in the last 72 hours.  Sensation intact distally Intact pulses distally Dorsiflexion/Plantar flexion intact Incision: dressing C/D/I   Assessment/Plan: 1 Day Post-Op Procedure(s) (LRB): ARTHROPLASTY, HIP, TOTAL, ANTERIOR APPROACH LEFT (Left) Up with therapy      Lonni CINDERELLA Poli 03/17/2024, 6:33 AM

## 2024-03-17 NOTE — Progress Notes (Signed)
 Physical Therapy Treatment Patient Details Name: [name] MRN: [id number] DOB: [birth year] Today's Date: 03/17/2024   History of Present Illness 78 yo female s/p L DA-THA 10/23. 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    PT Comments  Pt required mod assist bed mobility, mod assist sit to stand, and mod assist amb 6' with L platform RW +2 chair follow. Pt in recliner at end of session. Instructed in ankle pumps and isometric exercises.    If plan is discharge home, recommend the following: A lot of help with walking and/or transfers;A lot of help with bathing/dressing/bathroom;Assist for transportation;Assistance with cooking/housework   Can travel by private vehicle        Equipment Recommendations  Rolling walker (2 wheels) (L platform attachment)    Recommendations for Other Services       Precautions / Restrictions Precautions Precautions: Fall Recall of Precautions/Restrictions: Impaired Precaution/Restrictions Comments: L DA THA, no hip precautions and WBAT     Mobility  Bed Mobility Overal bed mobility: Needs Assistance Bed Mobility: Supine to Sit     Supine to sit: Mod assist, HOB elevated          Transfers Overall transfer level: Needs assistance Equipment used: Left platform walker Transfers: Sit to/from Stand Sit to Stand: Mod assist           General transfer comment: assist to power up and stabilize balance    Ambulation/Gait Ambulation/Gait assistance: Mod assist, +2 safety/equipment Gait Distance (Feet): 6 Feet Assistive device: Left platform walker Gait Pattern/deviations: Step-to pattern, Decreased step length - left, Decreased stance time - left, Trunk flexed, Antalgic, Decreased weight shift to left Gait velocity: decreased Gait velocity interpretation: <1.8 ft/sec, indicate of risk for recurrent falls   General Gait Details: Assist with LLE  through swing phase, balance, and RW management. +2 for chair follow. Continual cues for sequencing   Stairs             Wheelchair Mobility     Tilt Bed    Modified Rankin (Stroke Patients Only)       Balance Overall balance assessment: Needs assistance Sitting-balance support: No upper extremity supported, Feet supported Sitting balance-Leahy Scale: Fair     Standing balance support: Bilateral upper extremity supported, During functional activity, Reliant on assistive device for balance Standing balance-Leahy Scale: Poor                              Communication Communication Communication: No apparent difficulties  Cognition Arousal: Alert Behavior During Therapy: WFL for tasks assessed/performed   PT - Cognitive impairments: No apparent impairments                         Following commands: Intact      Cueing Cueing Techniques: Verbal cues, Gestural cues, Tactile cues  Exercises Total Joint Exercises Ankle Circles/Pumps: AROM, Both, 10 reps Quad Sets: AROM, Both, 5 reps Gluteal Sets: AROM, Both, 5 reps    General Comments        Pertinent Vitals/Pain Pain Assessment Pain Assessment: Faces Faces Pain Scale: Hurts little more Pain Location: L hip Pain Descriptors / Indicators: Guarding, Grimacing, Discomfort Pain Intervention(s): Monitored during session, Limited activity within patient's tolerance, Premedicated before session, Repositioned    Home Living  Prior Function            PT Goals (current goals can now be found in the care plan section) Acute Rehab PT Goals Patient Stated Goal: home Progress towards PT goals: Progressing toward goals    Frequency    7X/week      PT Plan      Co-evaluation              AM-PAC PT 6 Clicks Mobility   Outcome Measure  Help needed turning from your back to your side while in a flat bed without using bedrails?: A  Little Help needed moving from lying on your back to sitting on the side of a flat bed without using bedrails?: A Lot Help needed moving to and from a bed to a chair (including a wheelchair)?: A Lot Help needed standing up from a chair using your arms (e.g., wheelchair or bedside chair)?: A Lot Help needed to walk in hospital room?: A Lot Help needed climbing 3-5 steps with a railing? : Total 6 Click Score: 12    End of Session Equipment Utilized During Treatment: Gait belt Activity Tolerance: Patient tolerated treatment well;Patient limited by pain Patient left: in chair;with call bell/phone within reach;with chair alarm set;with family/visitor present Nurse Communication: Mobility status PT Visit Diagnosis: Other abnormalities of gait and mobility (R26.89);Muscle weakness (generalized) (M62.81)     Time: 9143-9077 PT Time Calculation (min) (ACUTE ONLY): 26 min  Charges:    $Gait Training: 8-22 mins $Therapeutic Activity: 8-22 mins PT General Charges $$ ACUTE PT VISIT: 1 Visit                     Sari MATSU., PT  Office # [phone]    [name] 03/17/2024, 11:02 AM

## 2024-03-18 DIAGNOSIS — M1612 Unilateral primary osteoarthritis, left hip: Secondary | ICD-10-CM | POA: Diagnosis not present

## 2024-03-18 DIAGNOSIS — E039 Hypothyroidism, unspecified: Secondary | ICD-10-CM | POA: Diagnosis not present

## 2024-03-18 DIAGNOSIS — I110 Hypertensive heart disease with heart failure: Secondary | ICD-10-CM | POA: Diagnosis not present

## 2024-03-18 DIAGNOSIS — Z7901 Long term (current) use of anticoagulants: Secondary | ICD-10-CM | POA: Diagnosis not present

## 2024-03-18 DIAGNOSIS — Z8542 Personal history of malignant neoplasm of other parts of uterus: Secondary | ICD-10-CM | POA: Diagnosis not present

## 2024-03-18 DIAGNOSIS — I509 Heart failure, unspecified: Secondary | ICD-10-CM | POA: Diagnosis not present

## 2024-03-18 DIAGNOSIS — Z79899 Other long term (current) drug therapy: Secondary | ICD-10-CM | POA: Diagnosis not present

## 2024-03-18 LAB — CBC
HCT: 30.3 % — ABNORMAL LOW (ref 36.0–46.0)
Hemoglobin: 10.2 g/dL — ABNORMAL LOW (ref 12.0–15.0)
MCH: 32.8 pg (ref 26.0–34.0)
MCHC: 33.7 g/dL (ref 30.0–36.0)
MCV: 97.4 fL (ref 80.0–100.0)
Platelets: 165 K/uL (ref 150–400)
RBC: 3.11 MIL/uL — ABNORMAL LOW (ref 3.87–5.11)
RDW: 12.7 % (ref 11.5–15.5)
WBC: 8 K/uL (ref 4.0–10.5)
nRBC: 0 % (ref 0.0–0.2)

## 2024-03-18 NOTE — Progress Notes (Signed)
 Occupational Therapy Treatment Patient Details Name: ONEITA ALLMON MRN: [id number] DOB: [birth year] Today's Date: 03/18/2024   History of present illness 78 yo female s/p L DA-THA 10/23. 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   OT comments  Patient seen by skilled OT to address transfers and addressing left platform to increase padding for increased comfort with use. Gel padding added to platform with mild improvement and eggcrate foam added with ACE wrap to keep in place with improved comfort/less pain.  Patient will benefit from continued inpatient follow up therapy, <3 hours/day unless patient declines and HHOT recommended to follow. Acute OT to continue to follow to address established goals to facilitate DC to next venue of care.        If plan is discharge home, recommend the following:  A lot of help with walking and/or transfers;A lot of help with bathing/dressing/bathroom;Assistance with cooking/housework;Help with stairs or ramp for entrance;Assist for transportation   Equipment Recommendations  None recommended by OT    Recommendations for Other Services      Precautions / Restrictions Precautions Precautions: Fall;Other (comment) Recall of Precautions/Restrictions: Impaired Precaution/Restrictions Comments: Pt given THA HEP handout; watch O2/HR Restrictions Weight Bearing Restrictions Per Provider Order: Yes LLE Weight Bearing Per Provider Order: Weight bearing as tolerated       Mobility Bed Mobility Overal bed mobility: Needs Assistance             General bed mobility comments: OOB in recliner    Transfers Overall transfer level: Needs assistance Equipment used: Left platform walker Transfers: Sit to/from Stand Sit to Stand: Min assist, Mod assist           General transfer comment: sit to stands from recliner with cues on hand placement     Balance Overall  balance assessment: Needs assistance Sitting-balance support: No upper extremity supported, Feet supported Sitting balance-Leahy Scale: Fair Sitting balance - Comments: in recliner   Standing balance support: Bilateral upper extremity supported, During functional activity, Reliant on assistive device for balance Standing balance-Leahy Scale: Poor Standing balance comment: reliant on RW for support                           ADL either performed or assessed with clinical judgement   ADL Overall ADL's : Needs assistance/impaired Eating/Feeding: Set up;Sitting Eating/Feeding Details (indicate cue type and reason): assistance with opening containers                                   General ADL Comments: focused on padding platform walker to increase comfort with use    Extremity/Trunk Assessment              Vision       Perception     Praxis     Communication Communication Communication: No apparent difficulties   Cognition Arousal: Alert Behavior During Therapy: WFL for tasks assessed/performed Cognition: No apparent impairments                               Following commands: Intact        Cueing   Cueing Techniques: Verbal cues, Gestural cues, Tactile cues, Visual cues  Exercises      Shoulder Instructions  General Comments padding added to left platform walker to increase comfort with use    Pertinent Vitals/ Pain       Pain Assessment Pain Assessment: 0-10 Pain Score: 5  Pain Location: L hip with standing Pain Descriptors / Indicators: Guarding, Grimacing, Discomfort, Sore Pain Intervention(s): Limited activity within patient's tolerance, Monitored during session, Repositioned  Home Living                                          Prior Functioning/Environment              Frequency  Min 2X/week        Progress Toward Goals  OT Goals(current goals can now be found in the  care plan section)  Progress towards OT goals: Progressing toward goals  Acute Rehab OT Goals Patient Stated Goal: less pain with walking OT Goal Formulation: With patient Time For Goal Achievement: 03/31/24 Potential to Achieve Goals: Good ADL Goals Pt Will Perform Upper Body Bathing: with set-up;with adaptive equipment;sitting Pt Will Perform Lower Body Bathing: with contact guard assist;sit to/from stand;with adaptive equipment Pt Will Perform Upper Body Dressing: with set-up;sitting Pt Will Perform Lower Body Dressing: with contact guard assist;sit to/from stand;with adaptive equipment Pt Will Transfer to Toilet: with contact guard assist;ambulating;bedside commode (over toilet) Pt Will Perform Toileting - Clothing Manipulation and hygiene: with contact guard assist;sit to/from stand Additional ADL Goal #1: Pt will be S in and OOB for basic ADLs (HOB up)  Plan      Co-evaluation                 AM-PAC OT 6 Clicks Daily Activity     Outcome Measure   Help from another person eating meals?: A Little Help from another person taking care of personal grooming?: A Little Help from another person toileting, which includes using toliet, bedpan, or urinal?: A Lot Help from another person bathing (including washing, rinsing, drying)?: A Lot Help from another person to put on and taking off regular upper body clothing?: A Little Help from another person to put on and taking off regular lower body clothing?: Total 6 Click Score: 14    End of Session Equipment Utilized During Treatment: Gait belt;Rolling walker (2 wheels) (left platform)  OT Visit Diagnosis: Unsteadiness on feet (R26.81);Other abnormalities of gait and mobility (R26.89);Muscle weakness (generalized) (M62.81);Pain Pain - Right/Left: Left Pain - part of body: Hip   Activity Tolerance Patient tolerated treatment well   Patient Left in chair;with call bell/phone within reach   Nurse Communication Mobility  status        Time: 1235-1300 OT Time Calculation (min): 25 min  Charges: OT General Charges $OT Visit: 1 Visit OT Treatments $Therapeutic Activity: 23-37 mins  Dick Laine, OTA Acute Rehabilitation Services  Office [phone]   Jeb LITTIE Laine 03/18/2024, 1:12 PM

## 2024-03-18 NOTE — Progress Notes (Signed)
 Physical Therapy Treatment Patient Details Name: [name] MRN: [id number] DOB: 09/20/1945 Today's Date: 03/18/2024   History of Present Illness 78 yo female s/p L DA-THA 10/23. 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    PT Comments  Pt received in chair, agreeable to therapy session, c/o back and L hip pain. Pt needing up to maxA for sit>stand from cushioned chair height to L PFRW and for stepping up onto platform step in room backward to simulate transfer into tall SUV via running board step. Pt quick to fatigue and with variable c/o pain, pt with decreased carryover of cues for sequencing/proper body mechanics and not currently able to go home without consistent +2 physical assist needed for bed mobility, transfers and car transfers. Spouse able to assist with guarding while pt up and ambulating but would be unsafe to provide heavy lifting assist as per pt/friends, he has a history of back problems and is slightly unsteady himself. Currently, patient will benefit from continued inpatient follow up therapy, <3 hours/day, pending progress next 1-2 days and surgeon recommendation.    If plan is discharge home, recommend the following: A lot of help with bathing/dressing/bathroom;Assist for transportation;Assistance with cooking/housework;Help with stairs or ramp for entrance;Two people to help with walking and/or transfers (+2 to get in bed)   Can travel by private vehicle        Equipment Recommendations  Hospital bed;Other (comment) (L PFRW already delivered to her room)    Recommendations for Other Services OT consult (OTA notified she needs assessment for padding)     Precautions / Restrictions Precautions Precautions: Fall;Other (comment) Recall of Precautions/Restrictions: Impaired Precaution/Restrictions Comments: Pt given THA HEP handout; watch O2/HR Restrictions Weight Bearing  Restrictions Per Provider Order: Yes LLE Weight Bearing Per Provider Order: Weight bearing as tolerated     Mobility  Bed Mobility Overal bed mobility: Needs Assistance Bed Mobility: Sit to Supine     Supine to sit: Mod assist, Used rails Sit to supine: Max assist, +2 for physical assistance   General bed mobility comments: spouse helping with BLE, PTA assist with trunk and bed pad assist to guide hips into proper area of bed. Attempted to show pt how to use gait belt as leg lifter with good RUE but not effective with +1 assist when attempting to return to flat bed, ultimately needed +2 assist.    Transfers Overall transfer level: Needs assistance Equipment used: Left platform walker Transfers: Sit to/from Stand Sit to Stand: Mod assist, From elevated surface, Max assist           General transfer comment: MaxA from cushioned recliner surface> L PFRW, then modA to/from L PFRW and very elevated bed height x2 reps    Ambulation/Gait Ambulation/Gait assistance: Min assist, +2 safety/equipment Gait Distance (Feet): 6 Feet Assistive device: Left platform walker Gait Pattern/deviations: Step-to pattern, Decreased step length - left, Decreased stance time - left, Trunk flexed, Antalgic, Decreased weight shift to left, Decreased dorsiflexion - left       General Gait Details: distance limited 2/2 pt fatigue after performing simulated SUV/step training and pt pain/fatigue. Assist for L PFRW mgmt while pivoting to EOB and during sidesteps along EOB to [facility].   Stairs Stairs: Yes Stairs assistance: Mod assist Stair Management: With walker, Step to pattern, Backwards (w/ L PFRW) Number of Stairs: 1 General stair comments: cues for sequencing/safety and visual/verbal demo prior to pt attempt,  pt needing R knee assist for extension and gait belt lift assist to step up on to 7 platform to simulate stepping up on to car running board for higher surface SUV transfer, bed height very  elevated to simulate sitting up on SUV seat.  Nearly maxA for lift/steadying assist when stepping up, pt heavily reliant on L PFRW support while stepping up. Cues for safe UE placement prior to sitting back on bed surface.   Wheelchair Mobility     Tilt Bed    Modified Rankin (Stroke Patients Only)       Balance Overall balance assessment: Needs assistance Sitting-balance support: No upper extremity supported, Feet supported Sitting balance-Leahy Scale: Fair     Standing balance support: Bilateral upper extremity supported, During functional activity, Reliant on assistive device for balance Standing balance-Leahy Scale: Poor Standing balance comment: RW and external assist needed                            Communication Communication Communication: No apparent difficulties  Cognition Arousal: Alert Behavior During Therapy: WFL for tasks assessed/performed   PT - Cognitive impairments: Memory, Problem solving, Safety/Judgement                       PT - Cognition Comments: Pt needs reminders for safe UE placement prior to sitting/standing and sequencing, some difficulty with verbal description of body mechanics prior to performing tasks. Unclear if pt has baseline memory deficits but some decreased carryover of technique from AM session to PM session observed. Spouse does have memory deficits at baseline and she helps him at home. Following commands: Intact      Cueing Cueing Techniques: Verbal cues, Gestural cues, Tactile cues, Visual cues  Exercises Total Joint Exercises Ankle Circles/Pumps: AROM, Both, Supine, Seated, 10 reps Quad Sets: AROM, Both, Supine (a couple reps for teachback) Heel Slides: AROM, Left, 5 reps, Supine (ROM limited 2/2 pain) Hip ABduction/ADduction: PROM, AAROM, Left, 5 reps, Supine (pt guarding and mostly PROM, very limited ROM) Long Arc Quad: AROM, Left, Seated, AAROM, 10 reps (needs AA to achieve TKE, only able to perform  20-30 deg with AROM) Knee Flexion: AROM, Left, 10 reps, Seated    General Comments General comments (skin integrity, edema, etc.): Padding intact on her L PFRW placed by OTA earlier in the day, pt reports it feels a little better this way.      Pertinent Vitals/Pain Pain Assessment Pain Assessment: Faces Pain Score: 5  Faces Pain Scale: Hurts even more Pain Location: intermittent c/o pain Pain Descriptors / Indicators: Guarding, Grimacing, Discomfort, Sore Pain Intervention(s): Limited activity within patient's tolerance, Monitored during session, Repositioned (offered ice pack for her hip but she defers)    Home Living                          Prior Function            PT Goals (current goals can now be found in the care plan section) Acute Rehab PT Goals Patient Stated Goal: home PT Goal Formulation: With patient Time For Goal Achievement: 03/30/24 Progress towards PT goals: Progressing toward goals    Frequency    7X/week      PT Plan      Co-evaluation              AM-PAC PT 6 Clicks Mobility   Outcome Measure  Help  needed turning from your back to your side while in a flat bed without using bedrails?: A Lot Help needed moving from lying on your back to sitting on the side of a flat bed without using bedrails?: Total (+2 assist needed) Help needed moving to and from a bed to a chair (including a wheelchair)?: A Lot Help needed standing up from a chair using your arms (e.g., wheelchair or bedside chair)?: A Lot Help needed to walk in hospital room?: A Lot Help needed climbing 3-5 steps with a railing? : Total 6 Click Score: 10    End of Session Equipment Utilized During Treatment: Gait belt Activity Tolerance: Patient tolerated treatment well;Patient limited by fatigue Patient left: with family/visitor present;Other (comment);in bed;with call bell/phone within reach;with bed alarm set (spouse Deatrice in room with her; heels floated) Nurse  Communication: Mobility status;Precautions;Other (comment) (needs hospital bed rental for home; not yet safe to go home with +1 assist) PT Visit Diagnosis: Other abnormalities of gait and mobility (R26.89);Muscle weakness (generalized) (M62.81)     Time: 8555-8494 PT Time Calculation (min) (ACUTE ONLY): 21 min  Charges:    $Therapeutic Activity: 8-22 mins PT General Charges $$ ACUTE PT VISIT: 1 Visit                     Edwar Coe P., PTA Acute Rehabilitation Services Secure Chat Preferred 9a-5:30pm Office: [phone]    Connell HERO [facility] 03/18/2024, 3:36 PM

## 2024-03-18 NOTE — Plan of Care (Signed)

## 2024-03-18 NOTE — TOC Progression Note (Addendum)
 Transition of Care [facility]) - Progression Note    Patient Details  Name: [name] MRN: [id number] Date of Birth: 12-14-1945  [facility] CM/SW Contact  Robynn Eileen Hoose, RN Phone Number: 03/18/2024, 12:52 PM  Clinical Narrative:   Secure message from PT regarding patient needing hospital bed at home. Message to provider for hospital bed order.  1350: Order placed with Jermaine ([employer]) for hospital bed as ordered.    Expected Discharge Plan: Home w Home Health Services Barriers to Discharge: Continued Medical Work up               Expected Discharge Plan and Services   Discharge Planning Services: CM Consult Post Acute Care Choice: Home Health Living arrangements for the past 2 months: Single Family Home                 DME Arranged:  (see note)         HH Arranged: PT HH Agency: [facility] Date HH Agency Contacted: 03/16/24 Time HH Agency Contacted: 1454 Representative spoke with at [facility] Agency: [facility]   Social Drivers of Health (SDOH) Interventions SDOH Screenings   Food Insecurity: No Food Insecurity (03/16/2024)  Housing: Low Risk  (03/16/2024)  Transportation Needs: No Transportation Needs (03/16/2024)  Utilities: Not At Risk (03/16/2024)  Financial Resource Strain: Low Risk  (07/31/2021)   Received from [facility]  Social Connections: Socially Integrated (03/16/2024)  Tobacco Use: Low Risk  (03/16/2024)    Readmission Risk Interventions    10/17/2021   12:19 PM 10/02/2021    3:25 PM  Readmission Risk Prevention Plan  Transportation Screening Complete Complete  PCP or Specialist Appt within 5-7 Days  Complete  Home Care Screening  Complete  Medication Review (RN CM)  Complete  HRI or Home Care Consult Complete   Social Work Consult for Recovery Care Planning/Counseling Complete   Palliative Care Screening Not Applicable   Medication Review Oceanographer) Complete

## 2024-03-18 NOTE — Progress Notes (Cosign Needed Addendum)
 Physical Therapy Treatment Patient Details Name: [name] MRN: [id number] DOB: [birth year] Today's Date: 03/18/2024   History of Present Illness 78 yo female s/p L DA-THA 10/23. 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    PT Comments  Pt received in supine, pleasantly agreeable to therapy session and with good participation and tolerance for bed mobility, transfer and gait training with L PFRW. Pt needing heavy modA for bed mobility sitting up to L EOB with reliance on hospital bed rail, supervising PT Alexa S notified as pt will need hospital bed rental for home. Pt able to perform transfers and gait with up to +2 modA, at times only minA with mod cues from elevated surface heights. Pt performed some supine LLE exercises and given THA HEP handout to reinforce but will need reinforcement and demos difficulty with L hip abduction and heel slides as well as L ankle dorsiflexion, often catching L toe on ground during gait trial. SpO2/HR WFL HR max ~106 bpm with exertion, no dizziness reported. Pt performed household distance gait trial with moderate c/o pain and c/o weakness toward end of trial. Discussion on post-acute rehab, which would be beneficial for further strengthening and caregiver training as pt spouse has memory deficit at baseline, but pt reports preference for HHPT. DME updated below per discussion with pt and PT as above. Pt will need additional session to reinforce HEP and practice car transfers prior to DC as she has to get up onto car running board step in order to DC home.    If plan is discharge home, recommend the following: A lot of help with walking and/or transfers;A lot of help with bathing/dressing/bathroom;Assist for transportation;Assistance with cooking/housework;Help with stairs or ramp for entrance   Can travel by private vehicle        Equipment Recommendations  Hospital bed;Other  (comment) (L PFRW already delivered to her room)    Recommendations for Other Services OT consult (OTA notified she needs assessment for padding)     Precautions / Restrictions Precautions Precautions: Fall;Other (comment) Recall of Precautions/Restrictions: Impaired Precaution/Restrictions Comments: Pt given THA HEP handout; watch O2/HR Restrictions Weight Bearing Restrictions Per Provider Order: Yes LLE Weight Bearing Per Provider Order: Weight bearing as tolerated     Mobility  Bed Mobility Overal bed mobility: Needs Assistance Bed Mobility: Supine to Sit     Supine to sit: Mod assist, Used rails     General bed mobility comments: A for Bil LE OOB and cues for trunk raising, pt unable to perform without using rail, will need hospital bed if going home directly from the hospital    Transfers Overall transfer level: Needs assistance Equipment used: Left platform walker Transfers: Sit to/from Stand Sit to Stand: Min assist, Mod assist, From elevated surface           General transfer comment: very elevated bed height (to simulate home set-up) with minA, then modA to lower chair surface (cushioned recliner seat) and light modA from elevated BSC height over toilet, mod cues for RUE placement and LLE placement and body mechanics    Ambulation/Gait Ambulation/Gait assistance: Min assist, Mod assist, +2 safety/equipment Gait Distance (Feet): 75 Feet (74ft to bathroom, seated break, then 34ft in hallway) Assistive device: Left platform walker Gait Pattern/deviations: Step-to pattern, Decreased step length - left, Decreased stance time - left, Trunk flexed, Antalgic, Decreased weight shift to left, Decreased dorsiflexion - left  General Gait Details: Intermittent manual assist with LLE through swing phase, balance, and RW management. Spouse present also guarding with gait belt as PTA assist with PFRW mgmt and pt LLE placement. ModA progressing to minA for forward  stepping, but needs closer to [facility] for turning and backward stepping. Pt at times catching L toe on floor with difficulty with hip/knee flexion due to weakness/pain causing increased fall risk.   Stairs Stairs:  (verbal discussion, pt will need to practice as she has to step up on car running board to go home.)           Wheelchair Mobility     Tilt Bed    Modified Rankin (Stroke Patients Only)       Balance Overall balance assessment: Needs assistance Sitting-balance support: No upper extremity supported, Feet supported Sitting balance-Leahy Scale: Fair     Standing balance support: Bilateral upper extremity supported, During functional activity, Reliant on assistive device for balance Standing balance-Leahy Scale: Poor Standing balance comment: RW and external assist needed                            Communication Communication Communication: No apparent difficulties  Cognition Arousal: Alert Behavior During Therapy: WFL for tasks assessed/performed   PT - Cognitive impairments: Memory, Problem solving, Safety/Judgement                       PT - Cognition Comments: Pt needs reminders for safe UE placement prior to sitting/standing and sequencing, some difficulty with verbal description of body mechanics prior to performing tasks (example: discussing getting up into higher SUV type vehicle with running board, pt did not understand instruction on stepping up with good leg, down with sore leg), she will need to practice stepping up/down step to simulate car transfer in second session. Following commands: Intact      Cueing Cueing Techniques: Verbal cues, Gestural cues, Tactile cues, Visual cues  Exercises Total Joint Exercises Ankle Circles/Pumps: AROM, Both, 20 reps, Supine, Seated (x10 reps ea posture) Quad Sets: AROM, Both, Supine (a couple reps for teachback) Heel Slides: AROM, Left, 5 reps, Supine (ROM limited 2/2 pain) Hip  ABduction/ADduction: PROM, AAROM, Left, 5 reps, Supine (pt guarding and mostly PROM, very limited ROM) Long Arc Quad: AROM, Left, Seated (not able to reach TKE 2/2 pain/weakness; a couple reps for teachback)    General Comments General comments (skin integrity, edema, etc.): Sneakers donned prior to OOB; pt encouraged to request ice pack for hip pain if needed, SpO2 96-97% on RA and HR 105 bpm with exertion (ambulation)      Pertinent Vitals/Pain Pain Assessment Pain Assessment: 0-10 Pain Score: 5  Pain Location: L hip with certain movements, pt reports minimal to no resting pain Pain Descriptors / Indicators: Guarding, Grimacing, Discomfort, Sore Pain Intervention(s): Monitored during session, Premedicated before session, Repositioned    Home Living                          Prior Function            PT Goals (current goals can now be found in the care plan section) Acute Rehab PT Goals Patient Stated Goal: home PT Goal Formulation: With patient Time For Goal Achievement: 03/30/24 Progress towards PT goals: Progressing toward goals    Frequency    7X/week      PT Plan  Co-evaluation              AM-PAC PT 6 Clicks Mobility   Outcome Measure  Help needed turning from your back to your side while in a flat bed without using bedrails?: A Lot Help needed moving from lying on your back to sitting on the side of a flat bed without using bedrails?: Total (anticipate 1 Total without bed rails) Help needed moving to and from a bed to a chair (including a wheelchair)?: A Lot Help needed standing up from a chair using your arms (e.g., wheelchair or bedside chair)?: A Lot Help needed to walk in hospital room?: A Lot Help needed climbing 3-5 steps with a railing? : Total 6 Click Score: 10    End of Session Equipment Utilized During Treatment: Gait belt Activity Tolerance: Patient tolerated treatment well;Patient limited by fatigue Patient left: in  chair;with call bell/phone within reach;with chair alarm set;with family/visitor present;Other (comment) (spouse Deatrice in room with her) Nurse Communication: Mobility status;Precautions;Other (comment) (needs hospital bed rental for home; may need ice pack/pain meds, pt was unsure) PT Visit Diagnosis: Other abnormalities of gait and mobility (R26.89);Muscle weakness (generalized) (M62.81)     Time: 8888-8840 PT Time Calculation (min) (ACUTE ONLY): 48 min  Charges:    $Gait Training: 8-22 mins $Therapeutic Exercise: 8-22 mins $Therapeutic Activity: 8-22 mins PT General Charges $$ ACUTE PT VISIT: 1 Visit                     Dalynn Jhaveri P., PTA Acute Rehabilitation Services Secure Chat Preferred 9a-5:30pm Office: [phone]    Connell HERO [facility] 03/18/2024, 12:38 PM

## 2024-03-18 NOTE — Plan of Care (Signed)
  Problem: Health Behavior/Discharge Planning: Goal: Ability to manage health-related needs will improve Outcome: Progressing   Problem: Clinical Measurements: Goal: Will remain free from infection Outcome: Progressing   Problem: Nutrition: Goal: Adequate nutrition will be maintained Outcome: Progressing   Problem: Coping: Goal: Level of anxiety will decrease Outcome: Progressing   Problem: Elimination: Goal: Will not experience complications related to bowel motility Outcome: Progressing   Problem: Safety: Goal: Ability to remain free from injury will improve Outcome: Progressing   Problem: Skin Integrity: Goal: Risk for impaired skin integrity will decrease Outcome: Progressing   

## 2024-03-18 NOTE — Progress Notes (Signed)
 Patient ID: [name], female   DOB: 07-Mar-1946, 78 y.o.   MRN: [id number] Patient is status post left total hip arthroplasty.  She is making slow progress with physical therapy.  Anticipate continue therapy through the weekend and possible discharge on Monday.  Patient is sitting up in bed and comfortable this morning without complaints.

## 2024-03-19 DIAGNOSIS — Z7901 Long term (current) use of anticoagulants: Secondary | ICD-10-CM | POA: Diagnosis not present

## 2024-03-19 DIAGNOSIS — Z79899 Other long term (current) drug therapy: Secondary | ICD-10-CM | POA: Diagnosis not present

## 2024-03-19 DIAGNOSIS — E039 Hypothyroidism, unspecified: Secondary | ICD-10-CM | POA: Diagnosis not present

## 2024-03-19 DIAGNOSIS — M1612 Unilateral primary osteoarthritis, left hip: Secondary | ICD-10-CM | POA: Diagnosis not present

## 2024-03-19 DIAGNOSIS — I509 Heart failure, unspecified: Secondary | ICD-10-CM | POA: Diagnosis not present

## 2024-03-19 DIAGNOSIS — Z8542 Personal history of malignant neoplasm of other parts of uterus: Secondary | ICD-10-CM | POA: Diagnosis not present

## 2024-03-19 DIAGNOSIS — I110 Hypertensive heart disease with heart failure: Secondary | ICD-10-CM | POA: Diagnosis not present

## 2024-03-19 NOTE — Progress Notes (Signed)
 Physical Therapy Treatment Patient Details Name: [name] MRN: [id number] DOB: 1946-01-14 Today's Date: 03/19/2024   History of Present Illness 78 yo female s/p L DA-THA 10/23. 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    PT Comments  Pt greeted supine in bed, pleasant and agreeable to PT session. She engaged in transfer and gait training. Moderate cues for sequencing and safety awareness. Pt completed sit<>stand with min-modA. She continues require assist to manage/maneuver L platform RW. Had pt utilize unilateral handrail in hallwy with good tolerance. Introduced quad cane and educated pt on proper and safe use of AD. She was able to ambulate using a step-to/step-through gait pattern with CGA. Reviewed L THA HEP and encouraged pt to complete exercises in sitting and supine. Will continue to follow acutely and advance appropriately.      If plan is discharge home, recommend the following: A lot of help with bathing/dressing/bathroom;Assist for transportation;Assistance with cooking/housework;Help with stairs or ramp for entrance;A lot of help with walking and/or transfers   Can travel by private vehicle        Equipment Recommendations  Other (comment) (quad cane)    Recommendations for Other Services       Precautions / Restrictions Precautions Precautions: Fall Recall of Precautions/Restrictions: Impaired Precaution/Restrictions Comments: Direct Anterior Approach, no posterior hip precautions Restrictions Weight Bearing Restrictions Per Provider Order: Yes LLE Weight Bearing Per Provider Order: Weight bearing as tolerated     Mobility  Bed Mobility Overal bed mobility: Needs Assistance Bed Mobility: Supine to Sit     Supine to sit: Min assist, HOB elevated, Used rails     General bed mobility comments: Pt sat up on L side of bed with increased time. Assist to manage LLE. Pt pulled  up on bed rail with RUE. Assist to scoot hips fwd with use of bed pad.    Transfers Overall transfer level: Needs assistance Equipment used: Left platform walker, Quad cane Transfers: Sit to/from Stand Sit to Stand: Min assist, Mod assist           General transfer comment: Pt stood from lowest bed height, recliner chair, and commode. Instructed pt to scoot fwd to edge of surface and increased fwd lean. She utilized momentum to help power up. Pt required modA for first stand and lessened to minA and then required modA again as she fatigued. She complete 3 sit<>stand using quad cane. Good eccentric control.    Ambulation/Gait Ambulation/Gait assistance: Min assist, Contact guard assist, +2 safety/equipment (Chair Follow (not really needed, pt declining to sit and rest)) Gait Distance (Feet): 50 Feet (1x50, standing rest break, 1x50, standing rest break, 1x30, facilaited seated rest break, 1x25) Assistive device: Left platform walker, Quad cane Gait Pattern/deviations: Step-through pattern, Decreased step length - right, Decreased stride length, Decreased weight shift to left, Antalgic Gait velocity: decreased Gait velocity interpretation: <1.8 ft/sec, indicate of risk for recurrent falls   General Gait Details: Pt ambulated using L platform RW. She took short, quick steps. Assist to manage AD and maintain appropriate support/stability. Pt was able to pass R foot with LLE but was step-to with L foot when moving RLE. Transitioned to pt using handrail in hallway for support on RUE. Introduced quad cane and educated pt on proper and safe use of AD. She had it in the RUE and advanced it at the same time as LLE. Pt demonstrated fair stability and required cues  for technique.   Stairs             Wheelchair Mobility     Tilt Bed    Modified Rankin (Stroke Patients Only)       Balance Overall balance assessment: Needs assistance Sitting-balance support: No upper extremity  supported, Feet supported Sitting balance-Leahy Scale: Fair     Standing balance support: Single extremity supported, During functional activity Standing balance-Leahy Scale: Fair Standing balance comment: Pt performed pericare in standing and ambulated using quad cane.                            Communication Communication Communication: No apparent difficulties  Cognition Arousal: Alert Behavior During Therapy: WFL for tasks assessed/performed   PT - Cognitive impairments: Problem solving, Safety/Judgement, Sequencing                       PT - Cognition Comments: Pt required moderate cues for sequencing and safety awareness. Following commands: Intact      Cueing Cueing Techniques: Verbal cues, Gestural cues  Exercises Total Joint Exercises Long Arc Quad: Seated, Left, AROM, 10 reps    General Comments        Pertinent Vitals/Pain Pain Assessment Pain Assessment: Faces Faces Pain Scale: Hurts little more Pain Location: L hip Pain Descriptors / Indicators: Guarding, Grimacing, Discomfort, Aching, Sore Pain Intervention(s): Monitored during session, Limited activity within patient's tolerance, Repositioned    Home Living                          Prior Function            PT Goals (current goals can now be found in the care plan section) Acute Rehab PT Goals PT Goal Formulation: With patient Time For Goal Achievement: 03/30/24 Potential to Achieve Goals: Good Progress towards PT goals: Progressing toward goals    Frequency    7X/week      PT Plan      Co-evaluation              AM-PAC PT 6 Clicks Mobility   Outcome Measure  Help needed turning from your back to your side while in a flat bed without using bedrails?: A Little Help needed moving from lying on your back to sitting on the side of a flat bed without using bedrails?: A Little Help needed moving to and from a bed to a chair (including a wheelchair)?:  A Little Help needed standing up from a chair using your arms (e.g., wheelchair or bedside chair)?: A Lot Help needed to walk in hospital room?: A Little Help needed climbing 3-5 steps with a railing? : Total 6 Click Score: 15    End of Session Equipment Utilized During Treatment: Gait belt Activity Tolerance: Patient tolerated treatment well Patient left: in chair;with call bell/phone within reach;with chair alarm set;with family/visitor present Nurse Communication: Mobility status PT Visit Diagnosis: Other abnormalities of gait and mobility (R26.89);Muscle weakness (generalized) (M62.81)     Time: 1008-1040 PT Time Calculation (min) (ACUTE ONLY): 32 min  Charges:    $Gait Training: 8-22 mins $Therapeutic Activity: 8-22 mins PT General Charges $$ ACUTE PT VISIT: 1 Visit                     Randall SAUNDERS, PT, DPT Acute Rehabilitation Services Office: [phone] Secure Chat Preferred  Delon CHRISTELLA Callander 03/19/2024, 12:23  PM

## 2024-03-19 NOTE — Plan of Care (Signed)
  Problem: Clinical Measurements: Goal: Will remain free from infection Outcome: Progressing   Problem: Nutrition: Goal: Adequate nutrition will be maintained Outcome: Progressing   Problem: Coping: Goal: Level of anxiety will decrease Outcome: Progressing   Problem: Safety: Goal: Ability to remain free from injury will improve Outcome: Progressing   Problem: Skin Integrity: Goal: Risk for impaired skin integrity will decrease Outcome: Progressing

## 2024-03-19 NOTE — Plan of Care (Signed)
  Problem: Nutrition: Goal: Adequate nutrition will be maintained Outcome: Progressing   Problem: Activity: Goal: Risk for activity intolerance will decrease Outcome: Progressing   Problem: Elimination: Goal: Will not experience complications related to bowel motility Outcome: Progressing   Problem: Elimination: Goal: Will not experience complications related to urinary retention Outcome: Progressing   Problem: Pain Managment: Goal: General experience of comfort will improve and/or be controlled Outcome: Progressing

## 2024-03-19 NOTE — Progress Notes (Signed)
 Patient ID: [name], female   DOB: 04-Jul-1945, 78 y.o.   MRN: [id number] Patient is status post left total knee arthroplasty.  Patient continues to make progress with therapy.  Husband at bedside.

## 2024-03-19 NOTE — Progress Notes (Signed)
 Loss PIV access in right hand, notified CN Tacy Balloon, RN to place an order for IV Consult.

## 2024-03-20 DIAGNOSIS — E039 Hypothyroidism, unspecified: Secondary | ICD-10-CM | POA: Diagnosis not present

## 2024-03-20 DIAGNOSIS — I110 Hypertensive heart disease with heart failure: Secondary | ICD-10-CM | POA: Diagnosis not present

## 2024-03-20 DIAGNOSIS — Z7901 Long term (current) use of anticoagulants: Secondary | ICD-10-CM | POA: Diagnosis not present

## 2024-03-20 DIAGNOSIS — M1612 Unilateral primary osteoarthritis, left hip: Secondary | ICD-10-CM | POA: Diagnosis not present

## 2024-03-20 DIAGNOSIS — Z79899 Other long term (current) drug therapy: Secondary | ICD-10-CM | POA: Diagnosis not present

## 2024-03-20 DIAGNOSIS — Z8542 Personal history of malignant neoplasm of other parts of uterus: Secondary | ICD-10-CM | POA: Diagnosis not present

## 2024-03-20 DIAGNOSIS — I509 Heart failure, unspecified: Secondary | ICD-10-CM | POA: Diagnosis not present

## 2024-03-20 MED ORDER — HYDROCODONE-ACETAMINOPHEN 5-325 MG PO TABS: 1.0000 | ORAL_TABLET | Freq: Four times a day (QID) | ORAL | 0 refills | Status: AC | PRN

## 2024-03-20 MED ORDER — METHOCARBAMOL 500 MG PO TABS: 500.0000 mg | ORAL_TABLET | Freq: Four times a day (QID) | ORAL | 1 refills | Status: AC | PRN

## 2024-03-20 NOTE — Progress Notes (Signed)
 Physical Therapy Treatment Patient Details Name: [name] MRN: [id number] DOB: March 23, 1946 Today's Date: 03/20/2024   History of Present Illness 78 yo female s/p L DA-THA 10/23. 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    PT Comments  Reviewed bed mobility, transfer, and gait techniques with patient her husband. He was actively involved in session and demonstrates good understanding for guard techniques.  He had modified her platform on RW, which provided better patient control but unable to WB through LUE residual limb. Encouraged additional padding as needed to allow for WB. She declines quad cane at this time but may try a different design after d/c (smaller base.) We used her Lonny Roughen with husband guarding. This was performed at a CGA level. They verbalize understanding of safety and techniques to reduce fall risk at home. Eager to d/c. Patient will continue to benefit from skilled physical therapy services to further improve independence with functional mobility.    If plan is discharge home, recommend the following: Assist for transportation;Assistance with cooking/housework;Help with stairs or ramp for entrance;A little help with walking and/or transfers;A little help with bathing/dressing/bathroom   Can travel by private vehicle        Equipment Recommendations   (Platform RW - already delivered to room - pt declines quad cane)    Recommendations for Other Services       Precautions / Restrictions Precautions Precautions: Fall Recall of Precautions/Restrictions: Impaired Precaution/Restrictions Comments: Direct Anterior Approach, no posterior hip precautions Restrictions Weight Bearing Restrictions Per Provider Order: Yes LLE Weight Bearing Per Provider Order: Weight bearing as tolerated     Mobility  Bed Mobility Overal bed mobility: Needs Assistance Bed Mobility: Supine to Sit      Supine to sit: Min assist, HOB elevated     General bed mobility comments: Reviewed techniques with pt and husband. Could not complete on her own entirely. Husband able to assist adequately to scoot to EOB.    Transfers Overall transfer level: Needs assistance Equipment used: Straight cane Transfers: Sit to/from Stand Sit to Stand: Contact guard assist           General transfer comment: CGA to rise with gait belt, SPC in Rt hand. Husband guarding with cues for techniques for pt and caregiver. Husband demonstrates good mechanics. Good control with descent into chair.    Ambulation/Gait Ambulation/Gait assistance: Contact guard assist Gait Distance (Feet): 85 Feet Assistive device: Left platform walker, Straight cane Gait Pattern/deviations: Step-through pattern, Decreased step length - right, Decreased stride length, Decreased weight shift to left, Antalgic, Decreased stance time - left Gait velocity: decreased Gait velocity interpretation: <1.31 ft/sec, indicative of household ambulator   General Gait Details: Educated pt and husband on gait mechanics and guarding techniques. He was able to safely guard without issues. Pt demonstrates mild instability, moderately antalgic pattern while using her [employer]. Also utilized platform RW which husband had modified for vertical arm rest on Lt for prior amputation. This provided better control and stability but does not help with offloading significantlly for LLE. Encouraged using as designed and to apply additional cushioning as needed (already multiple layers of foam in arm trough. They both feel confident with devices to safely mobilize at home.   Stairs             Wheelchair Mobility     Tilt Bed    Modified Rankin (Stroke Patients Only)  Balance Overall balance assessment: Needs assistance Sitting-balance support: No upper extremity supported, Feet supported Sitting balance-Leahy Scale: Fair      Standing balance support: Single extremity supported, During functional activity Standing balance-Leahy Scale: Poor Standing balance comment: Single UE for safety.                            Communication Communication Communication: No apparent difficulties  Cognition Arousal: Alert Behavior During Therapy: WFL for tasks assessed/performed   PT - Cognitive impairments: No apparent impairments                         Following commands: Intact      Cueing Cueing Techniques: Verbal cues  Exercises      General Comments General comments (skin integrity, edema, etc.): Reviewed AD use and modifications as needed.      Pertinent Vitals/Pain Pain Assessment Pain Assessment: Faces Faces Pain Scale: Hurts little more Pain Location: L hip Pain Descriptors / Indicators: Guarding, Grimacing, Discomfort, Aching, Sore Pain Intervention(s): Limited activity within patient's tolerance, Monitored during session, Repositioned    Home Living                          Prior Function            PT Goals (current goals can now be found in the care plan section) Acute Rehab PT Goals Patient Stated Goal: home PT Goal Formulation: With patient Time For Goal Achievement: 03/30/24 Potential to Achieve Goals: Good Progress towards PT goals: Progressing toward goals    Frequency    7X/week      PT Plan      Co-evaluation              AM-PAC PT 6 Clicks Mobility   Outcome Measure  Help needed turning from your back to your side while in a flat bed without using bedrails?: A Little Help needed moving from lying on your back to sitting on the side of a flat bed without using bedrails?: A Little Help needed moving to and from a bed to a chair (including a wheelchair)?: A Little Help needed standing up from a chair using your arms (e.g., wheelchair or bedside chair)?: A Little Help needed to walk in hospital room?: A Little Help needed  climbing 3-5 steps with a railing? : A Lot 6 Click Score: 17    End of Session Equipment Utilized During Treatment: Gait belt Activity Tolerance: Patient tolerated treatment well Patient left: in chair;with call bell/phone within reach;with chair alarm set;with family/visitor present Nurse Communication: Mobility status PT Visit Diagnosis: Other abnormalities of gait and mobility (R26.89);Muscle weakness (generalized) (M62.81);Pain;Difficulty in walking, not elsewhere classified (R26.2) Pain - Right/Left: Left Pain - part of body: Hip     Time: 8945-8881 PT Time Calculation (min) (ACUTE ONLY): 24 min  Charges:    $Gait Training: 8-22 mins $Therapeutic Activity: 8-22 mins PT General Charges $$ ACUTE PT VISIT: 1 Visit                     Leontine Roads, PT, DPT [facility]  Rehabilitation Services Physical Therapist Office: [phone] Website: [email or web address]    Leontine GORMAN Roads 03/20/2024, 1:03 PM

## 2024-03-20 NOTE — TOC Progression Note (Signed)
 Transition of Care (TOC) - Progression Note   Patient for discharge today.   Rolling walker lt platform at bedside. They have not been called from [facility] for delivery of hospital bed. NCM called Jermaine with [employer] , he will ask office to arrange delivery for today.   [facility] aware discharge is today    Patient Details  Name: [name] MRN: [id number] Date of Birth: Sep 27, 1945  Transition of Care [facility]) CM/SW Contact  Leeann Bady, Powell Jansky, RN Phone Number: 03/20/2024, 11:56 AM  Clinical Narrative:       Expected Discharge Plan: Home w Home Health Services Barriers to Discharge: Continued Medical Work up               Expected Discharge Plan and Services   Discharge Planning Services: CM Consult Post Acute Care Choice: Home Health Living arrangements for the past 2 months: Single Family Home Expected Discharge Date: 03/20/24               DME Arranged:  (see note)         HH Arranged: PT HH Agency: [facility] Date HH Agency Contacted: 03/16/24 Time HH Agency Contacted: 1454 Representative spoke with at [facility] Agency: [facility]   Social Drivers of Health (SDOH) Interventions SDOH Screenings   Food Insecurity: No Food Insecurity (03/16/2024)  Housing: Low Risk  (03/16/2024)  Transportation Needs: No Transportation Needs (03/16/2024)  Utilities: Not At Risk (03/16/2024)  Financial Resource Strain: Low Risk  (07/31/2021)   Received from [facility]  Social Connections: Socially Integrated (03/16/2024)  Tobacco Use: Low Risk  (03/16/2024)    Readmission Risk Interventions    10/17/2021   12:19 PM 10/02/2021    3:25 PM  Readmission Risk Prevention Plan  Transportation Screening Complete Complete  PCP or Specialist Appt within 5-7 Days  Complete  Home Care Screening  Complete  Medication Review (RN CM)  Complete  HRI or Home Care Consult Complete   Social Work Consult for Recovery Care Planning/Counseling Complete   Palliative  Care Screening Not Applicable   Medication Review Oceanographer) Complete

## 2024-03-20 NOTE — Progress Notes (Signed)
 Patient ID: [name], female   DOB: [birth year], 78 y.o.   MRN: [id number] The patient is awake and alert with her husband at the bedside.  She has been working slightly with PT and OT and has made good progress.  She would really like to go home today.  I believe everything can be set up in terms of home therapy and equipment that she needs for discharge to home today.

## 2024-03-20 NOTE — Discharge Summary (Signed)
 Patient ID: [name] MRN: [id number] DOB/AGE: 1946-03-04 78 y.o.  Admit date: 03/16/2024 Discharge date: 03/20/2024  Admission Diagnoses:  Principal Problem:   Unilateral primary osteoarthritis, left hip Active Problems:   Status post total replacement of left hip   Discharge Diagnoses:  Same  Past Medical History:  Diagnosis Date   Dysphasia    Endometrial cancer (HCC)    Heel spur    Heparin induced thrombocytopenia    Hypercholesteremia    Hypertension    Hypothyroidism    Migraine headache    Osteopenia    RLS (restless legs syndrome)    SVC syndrome    Thyroid  disease     Surgeries: Procedure(s): ARTHROPLASTY, HIP, TOTAL, ANTERIOR APPROACH LEFT on 03/16/2024   Consultants:   Discharged Condition: Improved  Hospital Course: [name] is an 78 y.o. female who was admitted 03/16/2024 for operative treatment ofUnilateral primary osteoarthritis, left hip. Patient has severe unremitting pain that affects sleep, daily activities, and work/hobbies. After pre-op clearance the patient was taken to the operating room on 03/16/2024 and underwent  Procedure(s): ARTHROPLASTY, HIP, TOTAL, ANTERIOR APPROACH LEFT.    Patient was given perioperative antibiotics:  Anti-infectives (From admission, onward)    Start     Dose/Rate Route Frequency Ordered Stop   03/16/24 0600  ceFAZolin  (ANCEF ) IVPB 2g/100 mL premix        2 g 200 mL/hr over 30 Minutes Intravenous On call to O.R. 03/16/24 9450 03/16/24 0754        Patient was given sequential compression devices, early ambulation, and chemoprophylaxis to prevent DVT.  Inpatient Morphine Milligram Equivalents Per Day 10/24 - 10/27     No orders with morphine equivalence       Patient benefited maximally from hospital stay and there were no complications.    Recent vital signs: Patient Vitals for the past 24 hrs:  BP Temp Temp src Pulse Resp SpO2  03/20/24 1126 (!) 163/81 97.9 F (36.6 C) -- 87 17 95 %  03/20/24  1006 (!) 144/68 -- -- -- -- --  03/20/24 0506 (!) 144/68 98 F (36.7 C) Oral 73 17 98 %  03/19/24 2055 (!) 143/70 98.1 F (36.7 C) Oral 79 18 97 %  03/19/24 1746 (!) 147/54 -- -- 81 15 96 %     Recent laboratory studies:  Recent Labs    03/18/24 0224  WBC 8.0  HGB 10.2*  HCT 30.3*  PLT 165     Discharge Medications:   Allergies as of 03/20/2024       Reactions   Iodinated Contrast Media Hives, Rash   Atorvastatin Calcium Other (See Comments)   Musculoskeletal aches Other reaction(s): Other (See Comments) Musculoskeletal aches   Doxycycline Other (See Comments)   Liver problems   Heparin Other (See Comments)   Causes blood clots. Other reaction(s): Other (See Comments) Causes blood clots.   Lisinopril Cough   Lovastatin Diarrhea   Ropinirole Hcl Nausea Only        Medication List     TAKE these medications    acetaminophen 650 MG CR tablet Commonly known as: TYLENOL Take 1,300 mg by mouth every 8 (eight) hours.   b complex vitamins tablet Take 2 tablets by mouth daily.   cholecalciferol 25 MCG (1000 UNIT) tablet Commonly known as: VITAMIN D3 Take 1,000 Units by mouth daily.   diltiazem  180 MG 24 hr capsule Commonly known as: CARDIZEM  CD Take 180 mg by mouth daily.   Eliquis  5 MG  Tabs tablet Generic drug: apixaban  TAKE 1 TABLET TWICE DAILY   hydrALAZINE  25 MG tablet Commonly known as: APRESOLINE  Take 25 mg by mouth 3 (three) times daily.   HYDROcodone-acetaminophen 5-325 MG tablet Commonly known as: NORCO/VICODIN Take 1-2 tablets by mouth every 6 (six) hours as needed for moderate pain (pain score 4-6).   ibuprofen 200 MG tablet Commonly known as: ADVIL Take 400 mg by mouth at bedtime.   IRON SUPPLEMENT PO Take 1 tablet by mouth daily.   levothyroxine  75 MCG tablet Commonly known as: SYNTHROID  Take 75 mcg by mouth daily before breakfast.   MAGNESIUM  MALATE PO Take 2 tablets by mouth daily.   methocarbamol 500 MG tablet Commonly  known as: ROBAXIN Take 1 tablet (500 mg total) by mouth every 6 (six) hours as needed for muscle spasms.   Neupro  2 MG/24HR Generic drug: rotigotine  Place 1 patch onto the skin daily.   olmesartan 20 MG tablet Commonly known as: BENICAR Take 20 mg by mouth daily.   potassium chloride  10 MEQ tablet Commonly known as: KLOR-CON  M Take 10 mEq by mouth daily.   pregabalin  75 MG capsule Commonly known as: LYRICA  Take 1 capsule (75 mg total) by mouth 3 (three) times daily.   rosuvastatin 5 MG tablet Commonly known as: CRESTOR Take 5 mg by mouth once a week.   TART CHERRY PO Take 1 tablet by mouth daily.   vitamin C 1000 MG tablet Take 1,000 mg by mouth 2 (two) times a day.               Durable Medical Equipment  (From admission, onward)           Start     Ordered   03/18/24 1346  For home use only DME Hospital bed  Once       Question Answer Comment  Length of Need 6 Months   Patient has (list medical condition): Left total hip arthroplasty   The above medical condition requires: Patient requires the ability to reposition frequently   Head must be elevated greater than: 30 degrees   Bed type Semi-electric   Support Surface: Gel Overlay      03/18/24 1347   03/17/24 0925  DME Walker rolling  Once       Comments: Left platform walker  Question Answer Comment  Walker: With 5 Inch Wheels   Patient needs a walker to treat with the following condition Status post total replacement of left hip      03/17/24 0924   03/16/24 1236  DME 3 n 1  Once        03/16/24 1235            Diagnostic Studies: DG Pelvis Portable Result Date: 03/16/2024 CLINICAL DATA:  Postop. EXAM: PORTABLE PELVIS 1-2 VIEWS COMPARISON:  None Available. FINDINGS: Left hip arthroplasty in expected alignment. No periprosthetic lucency or fracture. Recent postsurgical change includes air and edema in the soft tissues. Overlying skin staples in place. IMPRESSION: Left hip arthroplasty without  immediate postoperative complication. Electronically Signed   By: Andrea Gasman M.D.   On: 03/16/2024 09:39   DG HIP UNILAT WITH PELVIS 2-3 VIEWS LEFT Result Date: 03/16/2024 CLINICAL DATA:  Elective surgery. EXAM: DG HIP (WITH OR WITHOUT PELVIS) 2-3V LEFT COMPARISON:  None Available. FINDINGS: Four fluoroscopic spot views of the pelvis and left hip obtained in the operating room. Images during hip arthroplasty. Fluoroscopy time 24.3 seconds. Dose 1.95 mGy. IMPRESSION: Intraoperative fluoroscopy during left hip  arthroplasty. Electronically Signed   By: Andrea Gasman M.D.   On: 03/16/2024 09:38   DG C-Arm 1-60 Min-No Report Result Date: 03/16/2024 Fluoroscopy was utilized by the requesting physician.  No radiographic interpretation.    Disposition: Discharge disposition: 01-Home or Self Care          Follow-up Information     [facility]) Follow up.   Specialty: Home Health Services Contact information: [address] [place]  [address] [phone]        [facility] (DME) Follow up.   Specialty: DME Services Why: Company Left platform walker was ordered through [employer] information: [address] [employer] [place]  [address] [phone]        Vernetta Lonni GRADE, MD Follow up in 2 week(s).   Specialty: Orthopedic Surgery Contact information: [address] [place]  [place] [address] [phone]                  Signed: Lonni GRADE Vernetta 03/20/2024, 1:10 PM

## 2024-03-20 NOTE — Plan of Care (Signed)
 Problem: Education: Goal: Knowledge of General Education information will improve Description: Including pain rating scale, medication(s)/side effects and non-pharmacologic comfort measures 03/20/2024 1202 by Taray Normoyle K, RN Outcome: Adequate for Discharge 03/20/2024 1202 by Andrue Dini K, RN Outcome: Adequate for Discharge   Problem: Health Behavior/Discharge Planning: Goal: Ability to manage health-related needs will improve 03/20/2024 1202 by Caroljean Monsivais K, RN Outcome: Adequate for Discharge 03/20/2024 1202 by Leonid Manus K, RN Outcome: Adequate for Discharge   Problem: Clinical Measurements: Goal: Ability to maintain clinical measurements within normal limits will improve 03/20/2024 1202 by Shaunette Gassner K, RN Outcome: Adequate for Discharge 03/20/2024 1202 by Pamla Pangle K, RN Outcome: Adequate for Discharge Goal: Will remain free from infection 03/20/2024 1202 by Cesario Weidinger K, RN Outcome: Adequate for Discharge 03/20/2024 1202 by Issac Moure K, RN Outcome: Adequate for Discharge Goal: Diagnostic test results will improve 03/20/2024 1202 by Kippy Melena K, RN Outcome: Adequate for Discharge 03/20/2024 1202 by Roux Brandy K, RN Outcome: Adequate for Discharge Goal: Respiratory complications will improve 03/20/2024 1202 by Glena Pharris K, RN Outcome: Adequate for Discharge 03/20/2024 1202 by Sada Mazzoni K, RN Outcome: Adequate for Discharge Goal: Cardiovascular complication will be avoided 03/20/2024 1202 by Lain Tetterton K, RN Outcome: Adequate for Discharge 03/20/2024 1202 by Yovanny Coats K, RN Outcome: Adequate for Discharge   Problem: Activity: Goal: Risk for activity intolerance will decrease 03/20/2024 1202 by Cleavon Goldman K, RN Outcome: Adequate for Discharge 03/20/2024 1202 by Florian Dellis POUR, RN Outcome: Adequate for Discharge   Problem: Nutrition: Goal: Adequate nutrition will be maintained 03/20/2024 1202 by  Choya Tornow K, RN Outcome: Adequate for Discharge 03/20/2024 1202 by Florian Dellis POUR, RN Outcome: Adequate for Discharge   Problem: Coping: Goal: Level of anxiety will decrease 03/20/2024 1202 by Mark Hassey K, RN Outcome: Adequate for Discharge 03/20/2024 1202 by Becker Christopher K, RN Outcome: Adequate for Discharge   Problem: Elimination: Goal: Will not experience complications related to bowel motility 03/20/2024 1202 by Gwyneth Fernandez K, RN Outcome: Adequate for Discharge 03/20/2024 1202 by Kamuela Magos K, RN Outcome: Adequate for Discharge Goal: Will not experience complications related to urinary retention 03/20/2024 1202 by Ovila Lepage K, RN Outcome: Adequate for Discharge 03/20/2024 1202 by Lil Lepage K, RN Outcome: Adequate for Discharge   Problem: Pain Managment: Goal: General experience of comfort will improve and/or be controlled 03/20/2024 1202 by Lareina Espino K, RN Outcome: Adequate for Discharge 03/20/2024 1202 by Chas Axel K, RN Outcome: Adequate for Discharge   Problem: Safety: Goal: Ability to remain free from injury will improve 03/20/2024 1202 by Glenisha Gundry K, RN Outcome: Adequate for Discharge 03/20/2024 1202 by Cristiano Capri K, RN Outcome: Adequate for Discharge   Problem: Skin Integrity: Goal: Risk for impaired skin integrity will decrease 03/20/2024 1202 by Rozella Servello K, RN Outcome: Adequate for Discharge 03/20/2024 1202 by Shannia Jacuinde K, RN Outcome: Adequate for Discharge   Problem: Education: Goal: Knowledge of the prescribed therapeutic regimen will improve 03/20/2024 1202 by Ricard Faulkner K, RN Outcome: Adequate for Discharge 03/20/2024 1202 by Gloristine Turrubiates K, RN Outcome: Adequate for Discharge Goal: Understanding of discharge needs will improve 03/20/2024 1202 by Jarone Ostergaard K, RN Outcome: Adequate for Discharge 03/20/2024 1202 by Neria Procter K, RN Outcome: Adequate for Discharge Goal:  Individualized Educational Video(s) 03/20/2024 1202 by Lopez Dentinger K, RN Outcome: Adequate for Discharge 03/20/2024 1202 by Terrace Chiem K, RN Outcome: Adequate for Discharge   Problem: Activity: Goal: Ability to avoid complications of  mobility impairment will improve 03/20/2024 1202 by Jeremy Ditullio K, RN Outcome: Adequate for Discharge 03/20/2024 1202 by Chardae Mulkern K, RN Outcome: Adequate for Discharge Goal: Ability to tolerate increased activity will improve 03/20/2024 1202 by Rhet Rorke K, RN Outcome: Adequate for Discharge 03/20/2024 1202 by Chandell Attridge K, RN Outcome: Adequate for Discharge   Problem: Clinical Measurements: Goal: Postoperative complications will be avoided or minimized 03/20/2024 1202 by Maxey Ransom K, RN Outcome: Adequate for Discharge 03/20/2024 1202 by Scarlett Portlock K, RN Outcome: Adequate for Discharge   Problem: Pain Management: Goal: Pain level will decrease with appropriate interventions 03/20/2024 1202 by Imagene Boss K, RN Outcome: Adequate for Discharge 03/20/2024 1202 by Blayn Whetsell K, RN Outcome: Adequate for Discharge   Problem: Skin Integrity: Goal: Will show signs of wound healing 03/20/2024 1202 by Chevy Sweigert K, RN Outcome: Adequate for Discharge 03/20/2024 1202 by Karisha Marlin K, RN Outcome: Adequate for Discharge

## 2024-03-20 NOTE — Discharge Instructions (Signed)

## 2024-03-20 NOTE — Plan of Care (Signed)
  Problem: Activity: Goal: Risk for activity intolerance will decrease Outcome: Progressing   Problem: Coping: Goal: Level of anxiety will decrease Outcome: Progressing   Problem: Elimination: Goal: Will not experience complications related to bowel motility Outcome: Progressing   Problem: Pain Managment: Goal: General experience of comfort will improve and/or be controlled Outcome: Progressing

## 2024-03-21 DIAGNOSIS — F5104 Psychophysiologic insomnia: Secondary | ICD-10-CM | POA: Diagnosis not present

## 2024-03-21 DIAGNOSIS — E039 Hypothyroidism, unspecified: Secondary | ICD-10-CM | POA: Diagnosis not present

## 2024-03-21 DIAGNOSIS — I10 Essential (primary) hypertension: Secondary | ICD-10-CM | POA: Diagnosis not present

## 2024-03-21 DIAGNOSIS — Z7901 Long term (current) use of anticoagulants: Secondary | ICD-10-CM | POA: Diagnosis not present

## 2024-03-21 DIAGNOSIS — Z96642 Presence of left artificial hip joint: Secondary | ICD-10-CM | POA: Diagnosis not present

## 2024-03-21 DIAGNOSIS — G2581 Restless legs syndrome: Secondary | ICD-10-CM | POA: Diagnosis not present

## 2024-03-21 DIAGNOSIS — Z791 Long term (current) use of non-steroidal anti-inflammatories (NSAID): Secondary | ICD-10-CM | POA: Diagnosis not present

## 2024-03-21 DIAGNOSIS — D6959 Other secondary thrombocytopenia: Secondary | ICD-10-CM | POA: Diagnosis not present

## 2024-03-24 DIAGNOSIS — Z96642 Presence of left artificial hip joint: Secondary | ICD-10-CM | POA: Diagnosis not present

## 2024-03-24 DIAGNOSIS — Z7901 Long term (current) use of anticoagulants: Secondary | ICD-10-CM | POA: Diagnosis not present

## 2024-03-24 DIAGNOSIS — G2581 Restless legs syndrome: Secondary | ICD-10-CM | POA: Diagnosis not present

## 2024-03-24 DIAGNOSIS — D6959 Other secondary thrombocytopenia: Secondary | ICD-10-CM | POA: Diagnosis not present

## 2024-03-24 DIAGNOSIS — F5104 Psychophysiologic insomnia: Secondary | ICD-10-CM | POA: Diagnosis not present

## 2024-03-24 DIAGNOSIS — I10 Essential (primary) hypertension: Secondary | ICD-10-CM | POA: Diagnosis not present

## 2024-03-24 DIAGNOSIS — E039 Hypothyroidism, unspecified: Secondary | ICD-10-CM | POA: Diagnosis not present

## 2024-03-24 DIAGNOSIS — Z471 Aftercare following joint replacement surgery: Secondary | ICD-10-CM | POA: Diagnosis not present

## 2024-03-24 DIAGNOSIS — Z791 Long term (current) use of non-steroidal anti-inflammatories (NSAID): Secondary | ICD-10-CM | POA: Diagnosis not present

## 2024-03-27 ENCOUNTER — Encounter: Payer: Self-pay | Admitting: Radiology

## 2024-03-28 DIAGNOSIS — E039 Hypothyroidism, unspecified: Secondary | ICD-10-CM | POA: Diagnosis not present

## 2024-03-28 DIAGNOSIS — I10 Essential (primary) hypertension: Secondary | ICD-10-CM | POA: Diagnosis not present

## 2024-03-28 DIAGNOSIS — Z96642 Presence of left artificial hip joint: Secondary | ICD-10-CM | POA: Diagnosis not present

## 2024-03-28 DIAGNOSIS — Z471 Aftercare following joint replacement surgery: Secondary | ICD-10-CM | POA: Diagnosis not present

## 2024-03-28 DIAGNOSIS — G2581 Restless legs syndrome: Secondary | ICD-10-CM | POA: Diagnosis not present

## 2024-03-28 DIAGNOSIS — D6959 Other secondary thrombocytopenia: Secondary | ICD-10-CM | POA: Diagnosis not present

## 2024-03-28 DIAGNOSIS — Z791 Long term (current) use of non-steroidal anti-inflammatories (NSAID): Secondary | ICD-10-CM | POA: Diagnosis not present

## 2024-03-28 DIAGNOSIS — Z7901 Long term (current) use of anticoagulants: Secondary | ICD-10-CM | POA: Diagnosis not present

## 2024-03-28 DIAGNOSIS — F5104 Psychophysiologic insomnia: Secondary | ICD-10-CM | POA: Diagnosis not present

## 2024-03-30 ENCOUNTER — Encounter: Payer: Self-pay | Admitting: Orthopaedic Surgery

## 2024-03-30 ENCOUNTER — Ambulatory Visit (INDEPENDENT_AMBULATORY_CARE_PROVIDER_SITE_OTHER): Admitting: Orthopaedic Surgery

## 2024-03-30 DIAGNOSIS — Z96642 Presence of left artificial hip joint: Secondary | ICD-10-CM

## 2024-03-30 NOTE — Progress Notes (Signed)
 The patient is a very active 78 year old female who is 2 weeks today status post a left total hip replacement to treat significant left hip pain and arthritis.  She is ambulating with a quad cane.  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  She says she is doing well.  She is already off of narcotics.  She is on chronic Eliquis  for as a blood thinner.  Her left hip incision looks great.  Staples removed and Steri-Strips applied.  Her calf is soft.  There is no a lot of foot and ankle swelling.  She can drive from our standpoint.  Her husband does live with her and is with her and he does have some mild dementia.  She will continue increase her activities as comfort allows.  Will see her back in a month to see how she is doing from a mobility standpoint but no x-rays are needed.

## 2024-03-31 DIAGNOSIS — Z791 Long term (current) use of non-steroidal anti-inflammatories (NSAID): Secondary | ICD-10-CM | POA: Diagnosis not present

## 2024-03-31 DIAGNOSIS — Z96642 Presence of left artificial hip joint: Secondary | ICD-10-CM | POA: Diagnosis not present

## 2024-03-31 DIAGNOSIS — I10 Essential (primary) hypertension: Secondary | ICD-10-CM | POA: Diagnosis not present

## 2024-03-31 DIAGNOSIS — G2581 Restless legs syndrome: Secondary | ICD-10-CM | POA: Diagnosis not present

## 2024-03-31 DIAGNOSIS — E039 Hypothyroidism, unspecified: Secondary | ICD-10-CM | POA: Diagnosis not present

## 2024-03-31 DIAGNOSIS — Z7901 Long term (current) use of anticoagulants: Secondary | ICD-10-CM | POA: Diagnosis not present

## 2024-03-31 DIAGNOSIS — F5104 Psychophysiologic insomnia: Secondary | ICD-10-CM | POA: Diagnosis not present

## 2024-03-31 DIAGNOSIS — D6959 Other secondary thrombocytopenia: Secondary | ICD-10-CM | POA: Diagnosis not present

## 2024-04-04 DIAGNOSIS — Z7901 Long term (current) use of anticoagulants: Secondary | ICD-10-CM | POA: Diagnosis not present

## 2024-04-04 DIAGNOSIS — F5104 Psychophysiologic insomnia: Secondary | ICD-10-CM | POA: Diagnosis not present

## 2024-04-04 DIAGNOSIS — I10 Essential (primary) hypertension: Secondary | ICD-10-CM | POA: Diagnosis not present

## 2024-04-04 DIAGNOSIS — D6959 Other secondary thrombocytopenia: Secondary | ICD-10-CM | POA: Diagnosis not present

## 2024-04-04 DIAGNOSIS — Z471 Aftercare following joint replacement surgery: Secondary | ICD-10-CM | POA: Diagnosis not present

## 2024-04-04 DIAGNOSIS — G2581 Restless legs syndrome: Secondary | ICD-10-CM | POA: Diagnosis not present

## 2024-04-04 DIAGNOSIS — E039 Hypothyroidism, unspecified: Secondary | ICD-10-CM | POA: Diagnosis not present

## 2024-04-04 DIAGNOSIS — Z791 Long term (current) use of non-steroidal anti-inflammatories (NSAID): Secondary | ICD-10-CM | POA: Diagnosis not present

## 2024-04-04 DIAGNOSIS — Z96642 Presence of left artificial hip joint: Secondary | ICD-10-CM | POA: Diagnosis not present

## 2024-04-13 DIAGNOSIS — Z471 Aftercare following joint replacement surgery: Secondary | ICD-10-CM | POA: Diagnosis not present

## 2024-04-13 DIAGNOSIS — I10 Essential (primary) hypertension: Secondary | ICD-10-CM | POA: Diagnosis not present

## 2024-04-13 DIAGNOSIS — D6959 Other secondary thrombocytopenia: Secondary | ICD-10-CM | POA: Diagnosis not present

## 2024-04-13 DIAGNOSIS — Z791 Long term (current) use of non-steroidal anti-inflammatories (NSAID): Secondary | ICD-10-CM | POA: Diagnosis not present

## 2024-04-13 DIAGNOSIS — E039 Hypothyroidism, unspecified: Secondary | ICD-10-CM | POA: Diagnosis not present

## 2024-04-13 DIAGNOSIS — F5104 Psychophysiologic insomnia: Secondary | ICD-10-CM | POA: Diagnosis not present

## 2024-04-13 DIAGNOSIS — Z7901 Long term (current) use of anticoagulants: Secondary | ICD-10-CM | POA: Diagnosis not present

## 2024-04-13 DIAGNOSIS — Z96642 Presence of left artificial hip joint: Secondary | ICD-10-CM | POA: Diagnosis not present

## 2024-04-13 DIAGNOSIS — G2581 Restless legs syndrome: Secondary | ICD-10-CM | POA: Diagnosis not present

## 2024-04-18 DIAGNOSIS — Z7901 Long term (current) use of anticoagulants: Secondary | ICD-10-CM | POA: Diagnosis not present

## 2024-04-18 DIAGNOSIS — E039 Hypothyroidism, unspecified: Secondary | ICD-10-CM | POA: Diagnosis not present

## 2024-04-18 DIAGNOSIS — Z96642 Presence of left artificial hip joint: Secondary | ICD-10-CM | POA: Diagnosis not present

## 2024-04-18 DIAGNOSIS — D6959 Other secondary thrombocytopenia: Secondary | ICD-10-CM | POA: Diagnosis not present

## 2024-04-18 DIAGNOSIS — I10 Essential (primary) hypertension: Secondary | ICD-10-CM | POA: Diagnosis not present

## 2024-04-18 DIAGNOSIS — G2581 Restless legs syndrome: Secondary | ICD-10-CM | POA: Diagnosis not present

## 2024-04-18 DIAGNOSIS — F5104 Psychophysiologic insomnia: Secondary | ICD-10-CM | POA: Diagnosis not present

## 2024-04-18 DIAGNOSIS — Z791 Long term (current) use of non-steroidal anti-inflammatories (NSAID): Secondary | ICD-10-CM | POA: Diagnosis not present

## 2024-04-27 ENCOUNTER — Encounter: Payer: Self-pay | Admitting: Orthopaedic Surgery

## 2024-04-27 ENCOUNTER — Ambulatory Visit: Admitting: Orthopaedic Surgery

## 2024-04-27 DIAGNOSIS — Z96642 Presence of left artificial hip joint: Secondary | ICD-10-CM

## 2024-04-27 NOTE — Progress Notes (Signed)
 The patient is a 78 year old who is now 6 weeks status post a left total hip replacement.  She says other than having some problems getting up and down from a seated position and from a low seat she is doing well.  She says each week she is making improvements and is walking without any assistive device.  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  On exam, her left operative hip moves smoothly and fluidly with just some pain.  Her leg lengths are equal.  She will continue to increase her activity as comfort allows.  Since she is doing so well we do not need to see her back for 6 months unless she is having issues.  She knows to reach out to us  if there are issues.  If not when we see her in 6 months we will have a standing AP pelvis and lateral of her left hip.

## 2024-05-04 ENCOUNTER — Telehealth: Payer: Self-pay

## 2024-05-04 NOTE — Telephone Encounter (Signed)
 Faxed to provided number

## 2024-05-04 NOTE — Telephone Encounter (Signed)
 Harlene with [facility] would like pre-medication note/letter faxed to [phone], attn: Jessica.  Please advise.  Thank you.

## 2024-06-16 ENCOUNTER — Telehealth: Payer: Self-pay | Admitting: *Deleted

## 2024-06-16 NOTE — Telephone Encounter (Signed)
 [facility] 90 day post op call [email or web address]

## 2024-09-05 ENCOUNTER — Ambulatory Visit: Admitting: Neurology

## 2024-10-23 ENCOUNTER — Ambulatory Visit: Admitting: Orthopaedic Surgery
